# Patient Record
Sex: Female | Born: 1980 | Hispanic: Yes | Smoking: Never smoker
Health system: Southern US, Community
[De-identification: ages and names within clinical notes are randomized; demographics above are authoritative.]

## PROBLEM LIST (undated history)

## (undated) ENCOUNTER — Inpatient Hospital Stay (HOSPITAL_COMMUNITY): Payer: Self-pay

## (undated) DIAGNOSIS — Z8619 Personal history of other infectious and parasitic diseases: Secondary | ICD-10-CM

## (undated) DIAGNOSIS — K52832 Lymphocytic colitis: Secondary | ICD-10-CM

## (undated) DIAGNOSIS — R87619 Unspecified abnormal cytological findings in specimens from cervix uteri: Secondary | ICD-10-CM

## (undated) DIAGNOSIS — Z9889 Other specified postprocedural states: Secondary | ICD-10-CM

## (undated) DIAGNOSIS — IMO0002 Reserved for concepts with insufficient information to code with codable children: Secondary | ICD-10-CM

## (undated) DIAGNOSIS — A029 Salmonella infection, unspecified: Secondary | ICD-10-CM

## (undated) DIAGNOSIS — F32A Depression, unspecified: Secondary | ICD-10-CM

## (undated) DIAGNOSIS — R51 Headache: Secondary | ICD-10-CM

## (undated) DIAGNOSIS — R112 Nausea with vomiting, unspecified: Secondary | ICD-10-CM

## (undated) HISTORY — PX: UPPER GASTROINTESTINAL ENDOSCOPY: SHX188

## (undated) HISTORY — PX: COLPOSCOPY: SHX161

## (undated) HISTORY — DX: Unspecified abnormal cytological findings in specimens from cervix uteri: R87.619

## (undated) HISTORY — PX: COLONOSCOPY: SHX174

## (undated) HISTORY — DX: Salmonella infection, unspecified: A02.9

## (undated) HISTORY — DX: Headache: R51

## (undated) HISTORY — DX: Reserved for concepts with insufficient information to code with codable children: IMO0002

---
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
## 2004-09-18 DIAGNOSIS — A029 Salmonella infection, unspecified: Secondary | ICD-10-CM

## 2004-09-18 HISTORY — DX: Salmonella infection, unspecified: A02.9

## 2006-03-19 ENCOUNTER — Emergency Department (HOSPITAL_COMMUNITY): Admission: EM | Admit: 2006-03-19 | Discharge: 2006-03-19 | Payer: Self-pay | Admitting: Emergency Medicine

## 2007-01-08 ENCOUNTER — Other Ambulatory Visit: Admission: RE | Admit: 2007-01-08 | Discharge: 2007-01-08 | Payer: Self-pay | Admitting: Unknown Physician Specialty

## 2007-01-08 ENCOUNTER — Encounter (INDEPENDENT_AMBULATORY_CARE_PROVIDER_SITE_OTHER): Payer: Self-pay | Admitting: Specialist

## 2007-01-08 ENCOUNTER — Encounter (INDEPENDENT_AMBULATORY_CARE_PROVIDER_SITE_OTHER): Payer: Self-pay | Admitting: *Deleted

## 2007-07-09 ENCOUNTER — Other Ambulatory Visit: Admission: RE | Admit: 2007-07-09 | Discharge: 2007-07-09 | Payer: Self-pay | Admitting: Unknown Physician Specialty

## 2007-07-09 ENCOUNTER — Encounter (INDEPENDENT_AMBULATORY_CARE_PROVIDER_SITE_OTHER): Payer: Self-pay | Admitting: Unknown Physician Specialty

## 2008-02-25 ENCOUNTER — Other Ambulatory Visit: Admission: RE | Admit: 2008-02-25 | Discharge: 2008-02-25 | Payer: Self-pay | Admitting: Unknown Physician Specialty

## 2008-02-25 ENCOUNTER — Encounter (INDEPENDENT_AMBULATORY_CARE_PROVIDER_SITE_OTHER): Payer: Self-pay | Admitting: Unknown Physician Specialty

## 2009-12-12 ENCOUNTER — Observation Stay (HOSPITAL_COMMUNITY): Admission: EM | Admit: 2009-12-12 | Discharge: 2009-12-14 | Payer: Self-pay | Admitting: Emergency Medicine

## 2010-12-11 LAB — CBC
HCT: 37 % (ref 36.0–46.0)
Hemoglobin: 12.6 g/dL (ref 12.0–15.0)
Hemoglobin: 12.8 g/dL (ref 12.0–15.0)
MCHC: 34.6 g/dL (ref 30.0–36.0)
MCV: 86.8 fL (ref 78.0–100.0)
Platelets: 176 10*3/uL (ref 150–400)
Platelets: 180 10*3/uL (ref 150–400)
RBC: 4.21 MIL/uL (ref 3.87–5.11)
RDW: 13.6 % (ref 11.5–15.5)
RDW: 13.6 % (ref 11.5–15.5)
WBC: 9.8 10*3/uL (ref 4.0–10.5)

## 2010-12-11 LAB — COMPREHENSIVE METABOLIC PANEL
ALT: 12 U/L (ref 0–35)
AST: 12 U/L (ref 0–37)
BUN: 11 mg/dL (ref 6–23)
Calcium: 8.4 mg/dL (ref 8.4–10.5)
Chloride: 111 mEq/L (ref 96–112)
Creatinine, Ser: 0.7 mg/dL (ref 0.4–1.2)
GFR calc Af Amer: >60 mL/min (ref >60)
Sodium: 139 mEq/L (ref 135–145)
Total Bilirubin: 0.6 mg/dL (ref 0.3–1.2)

## 2010-12-11 LAB — HEPATIC FUNCTION PANEL
ALT: 13 U/L (ref 0–35)
Albumin: 3.8 g/dL (ref 3.5–5.2)
Total Protein: 7.1 g/dL (ref 6.0–8.3)

## 2010-12-11 LAB — DIFFERENTIAL
Basophils Absolute: 0 10*3/uL (ref 0.0–0.1)
Lymphocytes Relative: 26 % (ref 12–46)
Lymphs Abs: 2.1 10*3/uL (ref 0.7–4.0)
Lymphs Abs: 2.5 10*3/uL (ref 0.7–4.0)
Monocytes Relative: 7 % (ref 3–12)
Monocytes Relative: 7 % (ref 3–12)
Neutro Abs: 6.4 10*3/uL (ref 1.7–7.7)
Neutrophils Relative %: 65 % (ref 43–77)
Neutrophils Relative %: 68 % (ref 43–77)

## 2010-12-11 LAB — URINALYSIS, ROUTINE W REFLEX MICROSCOPIC
Bilirubin Urine: NEGATIVE
Hgb urine dipstick: NEGATIVE
Ketones, ur: NEGATIVE mg/dL
Protein, ur: NEGATIVE mg/dL
Specific Gravity, Urine: 1.02 (ref 1.005–1.030)
pH: 6.5 (ref 5.0–8.0)

## 2010-12-11 LAB — BASIC METABOLIC PANEL
BUN: 14 mg/dL (ref 6–23)
Chloride: 106 mEq/L (ref 96–112)
Creatinine, Ser: 1.05 mg/dL (ref 0.4–1.2)
Sodium: 138 mEq/L (ref 135–145)

## 2011-02-25 ENCOUNTER — Emergency Department (HOSPITAL_COMMUNITY)
Admission: EM | Admit: 2011-02-25 | Discharge: 2011-02-26 | Disposition: A | Discharge status: Home / Self Care | Payer: Self-pay | Attending: Emergency Medicine | Admitting: Emergency Medicine

## 2011-02-25 DIAGNOSIS — R112 Nausea with vomiting, unspecified: Secondary | ICD-10-CM | POA: Insufficient documentation

## 2011-02-25 DIAGNOSIS — R1031 Right lower quadrant pain: Secondary | ICD-10-CM | POA: Insufficient documentation

## 2011-02-26 ENCOUNTER — Emergency Department (HOSPITAL_COMMUNITY): Payer: Self-pay

## 2011-02-26 LAB — COMPREHENSIVE METABOLIC PANEL
AST: 17 U/L (ref 0–37)
Albumin: 3.1 g/dL — ABNORMAL LOW (ref 3.5–5.2)
Chloride: 107 mEq/L (ref 96–112)
Creatinine, Ser: 0.52 mg/dL (ref 0.4–1.2)
Potassium: 3.4 mEq/L — ABNORMAL LOW (ref 3.5–5.1)
Sodium: 140 mEq/L (ref 135–145)
Total Bilirubin: 0.1 mg/dL — ABNORMAL LOW (ref 0.3–1.2)

## 2011-02-26 LAB — CBC
HCT: 35.3 % — ABNORMAL LOW (ref 36.0–46.0)
Hemoglobin: 11.6 g/dL — ABNORMAL LOW (ref 12.0–15.0)
MCH: 28.5 pg (ref 26.0–34.0)
MCHC: 32.9 g/dL (ref 30.0–36.0)
MCV: 86.7 fL (ref 78.0–100.0)
Platelets: 179 K/uL (ref 150–400)
RBC: 4.07 MIL/uL (ref 3.87–5.11)
RDW: 13.6 % (ref 11.5–15.5)
WBC: 10.1 10*3/uL (ref 4.0–10.5)

## 2011-02-26 LAB — POCT PREGNANCY, URINE: Preg Test, Ur: NEGATIVE

## 2011-02-26 LAB — DIFFERENTIAL
Basophils Absolute: 0 K/uL (ref 0.0–0.1)
Basophils Relative: 0 % (ref 0–1)
Eosinophils Absolute: 0.2 K/uL (ref 0.0–0.7)
Eosinophils Relative: 2 % (ref 0–5)
Lymphocytes Relative: 18 % (ref 12–46)
Lymphs Abs: 1.9 10*3/uL (ref 0.7–4.0)
Monocytes Absolute: 1 K/uL (ref 0.1–1.0)
Monocytes Relative: 10 % (ref 3–12)
Neutro Abs: 7.1 K/uL (ref 1.7–7.7)
Neutrophils Relative %: 70 % (ref 43–77)

## 2011-02-26 LAB — COMPREHENSIVE METABOLIC PANEL WITH GFR
ALT: 19 U/L (ref 0–35)
Alkaline Phosphatase: 84 U/L (ref 39–117)
BUN: 18 mg/dL (ref 6–23)
CO2: 24 meq/L (ref 19–32)
Calcium: 8.1 mg/dL — ABNORMAL LOW (ref 8.4–10.5)
GFR calc Af Amer: >60 mL/min (ref >60)
GFR calc non Af Amer: >60 mL/min (ref >60)
Glucose, Bld: 108 mg/dL — ABNORMAL HIGH (ref 70–99)
Total Protein: 6.2 g/dL (ref 6.0–8.3)

## 2011-02-26 LAB — APTT: aPTT: 28 seconds (ref 24–37)

## 2011-02-26 LAB — URINALYSIS, ROUTINE W REFLEX MICROSCOPIC
Bilirubin Urine: NEGATIVE
Glucose, UA: NEGATIVE mg/dL
Hgb urine dipstick: NEGATIVE
Nitrite: NEGATIVE
Specific Gravity, Urine: 1.017 (ref 1.005–1.030)
pH: 7.5 (ref 5.0–8.0)

## 2011-02-26 LAB — WET PREP, GENITAL
Clue Cells Wet Prep HPF POC: NONE SEEN
Trich, Wet Prep: NONE SEEN
WBC, Wet Prep HPF POC: NONE SEEN

## 2011-02-26 LAB — LIPASE, BLOOD: Lipase: 35 U/L (ref 11–59)

## 2011-02-26 LAB — PROTIME-INR
INR: 1 (ref 0.00–1.49)
Prothrombin Time: 13.4 s (ref 11.6–15.2)

## 2011-02-26 MED ORDER — IOHEXOL 300 MG/ML  SOLN
100.0000 mL | Freq: Once | INTRAMUSCULAR | Status: AC | PRN
  Administered 2011-02-26: 100 mL via INTRAVENOUS

## 2011-02-28 LAB — GC/CHLAMYDIA PROBE AMP, GENITAL
Chlamydia, DNA Probe: NEGATIVE
GC Probe Amp, Genital: NEGATIVE

## 2011-11-15 ENCOUNTER — Emergency Department (HOSPITAL_COMMUNITY)
Admission: EM | Admit: 2011-11-15 | Discharge: 2011-11-15 | Disposition: A | Discharge status: Home / Self Care | Payer: Self-pay | Attending: Emergency Medicine | Admitting: Emergency Medicine

## 2011-11-15 ENCOUNTER — Encounter (HOSPITAL_COMMUNITY): Payer: Self-pay | Admitting: *Deleted

## 2011-11-15 DIAGNOSIS — J329 Chronic sinusitis, unspecified: Secondary | ICD-10-CM | POA: Insufficient documentation

## 2011-11-15 DIAGNOSIS — R51 Headache: Secondary | ICD-10-CM | POA: Insufficient documentation

## 2011-11-15 LAB — DIFFERENTIAL
Basophils Absolute: 0 10*3/uL (ref 0.0–0.1)
Basophils Relative: 0 % (ref 0–1)
Eosinophils Absolute: 0.1 10*3/uL (ref 0.0–0.7)
Lymphs Abs: 2.3 10*3/uL (ref 0.7–4.0)
Neutrophils Relative %: 67 % (ref 43–77)

## 2011-11-15 LAB — CBC
MCH: 28.8 pg (ref 26.0–34.0)
MCHC: 33.2 g/dL (ref 30.0–36.0)
Platelets: 200 10*3/uL (ref 150–400)
RBC: 4.52 MIL/uL (ref 3.87–5.11)

## 2011-11-15 LAB — BASIC METABOLIC PANEL
GFR calc Af Amer: >90 mL/min (ref >90)
GFR calc non Af Amer: >90 mL/min (ref >90)
Potassium: 4 mEq/L (ref 3.5–5.1)
Sodium: 141 mEq/L (ref 135–145)

## 2011-11-15 MED ORDER — PROMETHAZINE HCL 12.5 MG PO TABS
25.0000 mg | ORAL_TABLET | Freq: Once | ORAL | Status: AC
  Administered 2011-11-15: 25 mg via ORAL
  Filled 2011-11-15: qty 2

## 2011-11-15 MED ORDER — IBUPROFEN 800 MG PO TABS
800.0000 mg | ORAL_TABLET | Freq: Once | ORAL | Status: AC
  Administered 2011-11-15: 800 mg via ORAL
  Filled 2011-11-15: qty 1

## 2011-11-15 MED ORDER — AMOXICILLIN 500 MG PO CAPS: 500.0000 mg | ORAL_CAPSULE | Freq: Three times a day (TID) | ORAL | Status: AC

## 2011-11-15 MED ORDER — IBUPROFEN 800 MG PO TABS: 800.0000 mg | ORAL_TABLET | Freq: Three times a day (TID) | ORAL | Status: AC

## 2011-11-15 MED ORDER — FAMOTIDINE 20 MG PO TABS: 20.0000 mg | ORAL_TABLET | Freq: Two times a day (BID) | ORAL | Status: DC

## 2011-11-15 NOTE — ED Notes (Signed)
Waiting for lab to draw blood

## 2011-11-15 NOTE — Discharge Instructions (Signed)
Sinusitis (Sinusitis) Los senos paranasales son bolsas de aire que se encuentran dentro de los huesos de la cara. La aparicin de bacterias en los senos paranasales lleva a una infeccin. Esta se denomina sinusitis.Estas infecciones generalmente son el resultado de una obstruccin en los orificios que drenan los senos.   SNTOMAS Generalmente, segn que seno se infecte, habr diferentes reas en las que puede aparecer dolor.    Los senos maxilares generalmente producen dolor detrs de los ojos.     La sinusitis frontal ocasiona dolor en el medio de la frente y sobre los ojos.  Entre otros problemas (sntomas) se incluyen:  Dolor en la zona posterior de los dientes superiores.     Una secrecin similar al pus (purulenta) proveniente de la nariz.     Toda inflamacin, calor o sensibilidad en estas mismas reas son indicios de infeccin.  TRATAMIENTO La sinusitis se diagnostica a travs del examen fsico y radiografas. Si le han tomado radiografas, asegrese de retirar los resultados. O consulte el modo en que podr obtenerlos. Su mdico le prescribir medicamentos (antibiticos). Estos medicamentos se indican para combatir la infeccin. Tambin le prescribir un descongestivo para reducir la inflamacin del seno paranasal.   INSTRUCCIONES PARA EL CUIDADO DOMICILIARIO  Utilice los medicamentos de venta libre o de prescripcin para el dolor, el malestar o la fiebre, segn se lo indique el profesional que lo asiste.     Beba gran cantidad de lquidos. Los lquidos ayudan a que las mucosas de los senos nasales drenen ms fcilmente.     Aplique bolsas de calor hmedo o hielo en las zonas ms doloridas para aliviar las molestias.     Utilice Sprays nasales salinos para ayudar a humedecer los senos nasales. Estos pueden encontrarse en la farmacia local.  SOLICITE ATENCIN MDICA DE INMEDIATO SI:  Tiene fiebre.     Dolor en aumento, dolor de cabeza intenso o dolor de dientes.     Nuseas,  vmitos o sudoracin.     Hinchazn infrecuente alrededor del rostro o problemas en la visin.  EST SEGURO QUE:    Comprende las instrucciones para el alta mdica.     Controlar su enfermedad.     Solicitar atencin mdica de inmediato segn las indicaciones.  Document Released: 06/14/2005 Document Revised: 05/17/2011 ExitCare Patient Information 2012 [employer]. 

## 2011-11-15 NOTE — ED Notes (Signed)
Pt c/o pain in her face and right eye since 1 pm.

## 2011-11-15 NOTE — ED Notes (Signed)
Interpreter line contacted

## 2011-11-15 NOTE — ED Provider Notes (Signed)
History     CSN: [id number]  Arrival date & time 11/15/11  1713   First MD Initiated Contact with Patient 11/15/11 1748      Chief Complaint  Patient presents with  . Facial Pain    (Consider location/radiation/quality/duration/timing/severity/associated sxs/prior treatment) HPI Comments: Patient c/o sudden onset of pain to her right face through the use of [place] language interpreter.  Describes the pain as aching from her teeth up to her right ear and eye.  She denies fever, headache, neck pain or visual changes.  Patient is a 31 y.o. female presenting with sinusitis. The history is provided by the patient. The history is limited by a language barrier. A language interpreter was used.  Sinusitis  This is a new problem. The current episode started 3 to 5 hours ago. The problem has not changed since onset.There has been no fever. The pain is mild. The pain has been constant since onset. Associated symptoms include congestion and sinus pressure. Pertinent negatives include no chills, no sweats, no ear pain, no hoarse voice, no sore throat, no swollen glands, no cough and no shortness of breath. She has tried nothing for the symptoms. The treatment provided no relief.    History reviewed. No pertinent past medical history.  Past Surgical History  Procedure Date  . Cesarean section     History reviewed. No pertinent family history.  History  Substance Use Topics  . Smoking status: Never Smoker   . Smokeless tobacco: Not on file  . Alcohol Use: No    OB History    Grav Para Term Preterm Abortions TAB SAB Ect Mult Living                  Review of Systems  Constitutional: Negative for fever and chills.  HENT: Positive for congestion and sinus pressure. Negative for hearing loss, ear pain, sore throat, hoarse voice, facial swelling, trouble swallowing, neck pain and neck stiffness.   Respiratory: Negative for cough and shortness of breath.   Skin: Negative.   Neurological:  Negative for dizziness, numbness and headaches.  Hematological: Negative for adenopathy.  All other systems reviewed and are negative.    Allergies  Review of patient's allergies indicates no known allergies.  Home Medications  No current outpatient prescriptions on file.  BP 127/74  Pulse 60  Temp(Src) 98.5 F (36.9 C) (Oral)  Resp 17  Ht 5\' 6"  (1.676 m)  Wt 150 lb (68.04 kg)  BMI 24.21 kg/m2  SpO2 100%  LMP 11/04/2011  Physical Exam  Nursing note and vitals reviewed. Constitutional: She is oriented to person, place, and time. She appears well-developed and well-nourished. No distress.  HENT:  Head: Normocephalic and atraumatic.  Right Ear: Tympanic membrane and ear canal normal.  Left Ear: Tympanic membrane and ear canal normal.  Mouth/Throat: Uvula is midline, oropharynx is clear and moist and mucous membranes are normal.  Eyes: Conjunctivae, EOM and lids are normal. Pupils are equal, round, and reactive to light.  Neck: Trachea normal and normal range of motion. Neck supple. No spinous process tenderness and no muscular tenderness present. No Brudzinski's sign and no Kernig's sign noted.  Cardiovascular: Normal rate, regular rhythm, normal heart sounds and intact distal pulses.   No murmur heard. Pulmonary/Chest: Effort normal and breath sounds normal. No respiratory distress.  Musculoskeletal: Normal range of motion. She exhibits no edema and no tenderness.  Lymphadenopathy:    She has no cervical adenopathy.  Neurological: She is alert and oriented to  person, place, and time. She exhibits normal muscle tone. Coordination normal.  Skin: Skin is warm and dry.    ED Course  Procedures (including critical care time)  Results for orders placed during the hospital encounter of 11/15/11  CBC      Component Value Range   WBC 9.4  4.0 - 10.5 (K/uL)   RBC 4.52  3.87 - 5.11 (MIL/uL)   Hemoglobin 13.0  12.0 - 15.0 (g/dL)   HCT 14.7  82.9 - 56.2 (%)   MCV 86.7  78.0 -  100.0 (fL)   MCH 28.8  26.0 - 34.0 (pg)   MCHC 33.2  30.0 - 36.0 (g/dL)   RDW 13.0  86.5 - 78.4 (%)   Platelets 200  150 - 400 (K/uL)  DIFFERENTIAL      Component Value Range   Neutrophils Relative 67  43 - 77 (%)   Neutro Abs 6.4  1.7 - 7.7 (K/uL)   Lymphocytes Relative 25  12 - 46 (%)   Lymphs Abs 2.3  0.7 - 4.0 (K/uL)   Monocytes Relative 6  3 - 12 (%)   Monocytes Absolute 0.5  0.1 - 1.0 (K/uL)   Eosinophils Relative 2  0 - 5 (%)   Eosinophils Absolute 0.1  0.0 - 0.7 (K/uL)   Basophils Relative 0  0 - 1 (%)   Basophils Absolute 0.0  0.0 - 0.1 (K/uL)  BASIC METABOLIC PANEL      Component Value Range   Sodium 141  135 - 145 (mEq/L)   Potassium 4.0  3.5 - 5.1 (mEq/L)   Chloride 106  96 - 112 (mEq/L)   CO2 27  19 - 32 (mEq/L)   Glucose, Bld 99  70 - 99 (mg/dL)   BUN 13  6 - 23 (mg/dL)   Creatinine, Ser 6.96  0.50 - 1.10 (mg/dL)   Calcium 9.7  8.4 - 29.5 (mg/dL)   GFR calc non Af Amer >90  >90 (mL/min)   GFR calc Af Amer >90  >90 (mL/min)  RAPID STREP SCREEN      Component Value Range   Streptococcus, Group A Screen (Direct) NEGATIVE  NEGATIVE          MDM    Previous ED chart and nursing notes were reviewed by me.  Patient is feeling better. States her facial pain has improved after medication.  Vital signs are stable patient is nontoxic appearing.  She has drank fluids without difficulty.  She complained of some GI upset upon reevaluation.  Abdominal exam is benign. Abdomen is soft and nontender, no peritoneal signs.  Family member at the bedside states that she has a history of chronic abdominal pain.  She was seen at [facility] in August of last year and had a full workup for abdominal pain without specific cause.    Labs today are within normal limits.  Facial pain is likely related to dental pain versus sinusitis. I will prescribe pain medication and antibiotics.  She requests a referral for a primary care physician. She also agrees to return here if symptoms  worsen.Patient / Family / Caregiver understand and agree with initial ED impression and plan with expectations set for ED visit. Pt stable in ED with no significant deterioration in condition.   Emmali Karow L. [place], [place] 11/17/11 2109

## 2011-11-17 NOTE — ED Provider Notes (Signed)
Medical screening examination/treatment/procedure(s) were performed by non-physician practitioner and as supervising physician I was immediately available for consultation/collaboration.  Arely Tinner R. Dori Devino, MD 11/17/11 2336 

## 2013-04-17 ENCOUNTER — Other Ambulatory Visit: Payer: Self-pay | Admitting: Obstetrics & Gynecology

## 2013-04-17 DIAGNOSIS — O3680X0 Pregnancy with inconclusive fetal viability, not applicable or unspecified: Secondary | ICD-10-CM

## 2013-04-22 ENCOUNTER — Ambulatory Visit (INDEPENDENT_AMBULATORY_CARE_PROVIDER_SITE_OTHER): Payer: Self-pay

## 2013-04-22 DIAGNOSIS — O34219 Maternal care for unspecified type scar from previous cesarean delivery: Secondary | ICD-10-CM

## 2013-04-22 DIAGNOSIS — O26849 Uterine size-date discrepancy, unspecified trimester: Secondary | ICD-10-CM

## 2013-04-22 DIAGNOSIS — O3680X0 Pregnancy with inconclusive fetal viability, not applicable or unspecified: Secondary | ICD-10-CM

## 2013-04-22 NOTE — Progress Notes (Signed)
U/S-single IUP with+FCA noted, CRL c/w 7+2wks EDD 12/07/2013, cx long and closed, bilateral adnexa wnl

## 2013-05-05 ENCOUNTER — Ambulatory Visit (INDEPENDENT_AMBULATORY_CARE_PROVIDER_SITE_OTHER): Payer: Self-pay | Admitting: Women's Health

## 2013-05-05 ENCOUNTER — Encounter: Payer: Self-pay | Admitting: Women's Health

## 2013-05-05 VITALS — BP 116/72 | Temp 98.5°F | Wt 129.5 lb

## 2013-05-05 DIAGNOSIS — O34219 Maternal care for unspecified type scar from previous cesarean delivery: Secondary | ICD-10-CM

## 2013-05-05 DIAGNOSIS — O219 Vomiting of pregnancy, unspecified: Secondary | ICD-10-CM | POA: Insufficient documentation

## 2013-05-05 DIAGNOSIS — Z3481 Encounter for supervision of other normal pregnancy, first trimester: Secondary | ICD-10-CM

## 2013-05-05 DIAGNOSIS — Z331 Pregnant state, incidental: Secondary | ICD-10-CM

## 2013-05-05 DIAGNOSIS — Z3480 Encounter for supervision of other normal pregnancy, unspecified trimester: Secondary | ICD-10-CM | POA: Insufficient documentation

## 2013-05-05 DIAGNOSIS — Z98891 History of uterine scar from previous surgery: Secondary | ICD-10-CM

## 2013-05-05 DIAGNOSIS — Z1389 Encounter for screening for other disorder: Secondary | ICD-10-CM

## 2013-05-05 LAB — CBC
MCH: 29 pg (ref 26.0–34.0)
MCHC: 33.9 g/dL (ref 30.0–36.0)
MCV: 85.7 fL (ref 78.0–100.0)

## 2013-05-05 LAB — POCT URINALYSIS DIPSTICK
Ketones, UA: NEGATIVE
Leukocytes, UA: NEGATIVE

## 2013-05-05 MED ORDER — PROMETHAZINE HCL 25 MG PO TABS: 25.0000 mg | ORAL_TABLET | Freq: Four times a day (QID) | ORAL | Status: DC | PRN

## 2013-05-05 NOTE — Progress Notes (Signed)
New OB packet given. Consents signed.  

## 2013-05-05 NOTE — Patient Instructions (Signed)
Embarazo  Primer trimestre  (Pregnancy - First Trimester)  Durante el acto sexual, millones de espermatozoides entran en la vagina. Slo 1 espermatozoide penetra y fertiliza al vulo mientras se encuentra en la trompa de [place]. Una semana ms tarde, el vulo fertilizado se implanta en la pared del tero. Un embrin comienza a desarrollarse para ser un beb. A las 6 a 8 semanas se forman los ojos y la cara y los latidos del corazn se pueden ver en la ecografa. Al final de las 12 semanas (primer trimestre) todos los rganos del beb estn formados. Ahora que est embarazada, querr hacer todo lo que est a su alcance para tener un beb sano. Dos de las cosas ms importantes son: tener una buena atencin prenatal y seguir las indicaciones del profesional que la asiste. La atencin prenatal incluye toda la asistencia mdica que usted recibe antes del nacimiento del beb. Se lleva a cabo para prevenir y tratar problemas durante el embarazo y el parto. EXAMENES PRENATALES   Durante las visitas prenatales se controlan el peso, la presin arterial y se solicitan anlisis de orina. Esto se hace para asegurarse de que usted est sana y el embarazo progrese normalmente.  Una mujer embarazada debe aumentar de 25 a 35 libras durante el embarazo. Sin embargo, si usted tiene sobrepeso o bajo peso, su mdico le aconsejar qu hacer.  El podr hacerle preguntas y responder todas las que usted le haga.  Durante los exmenes prenatales se solicitan anlisis de sangre, cultivos del tero, un [place] y otros anlisis necesarios. Estas pruebas se realizan para controlar su salud y la del beb. Se recomienda que se haga la prueba para el diagnstico del VIH, con su autorizacin. Este es el virus que causa el SIDA. Estas pruebas se realizan porque existen medicamentos que podran administrarle para prevenir que el beb nazca con esta infeccin, si usted estuviera infectada y no lo supiera. Los anlisis de sangre  tambin se realizan para determinar el tipo de sangre, si tuvo infecciones previas y controlar sus niveles en la sangre (hemoglobina).  Tener un recuento bajo de hemoglobina (anemia) es comn durante el embarazo. Para prevenirla, se administran hierro y vitaminas. En una etapa ms avanzada del embarazo, le indicarn exmenes de sangre para saber si tiene diabetes, junto con otros anlisis, en caso de que tuviera problemas.  Es necesario que se haga las pruebas para asegurarse de que usted y el beb estn bien. CAMBIOS DURANTE EL PRIMER TRIMESTRE  Su organismo atravesar numerosos cambios durante el embarazo. Estos pueden variar de una persona a otra. Converse con el mdico acerca los cambios que usted nota y que la preocupan. Ellos son:   El perodo menstrual se detiene.  El vulo y los espermatozoides llevan los genes que determinan cmo seremos. Sus genes y los de su pareja forman el beb. Los genes del varn determinan si ser un nio o una nia.  La circunferencia de la cintura va a ir aumentando y podr sentirse hinchada.  Puede tener malestar estomacal (nuseas) y vmitos. Si no puede controlar los vmitos, consulte a su mdico.  Sus mamas comenzarn a agrandarse y sensibilizarse.  Los pezones pueden sobresalir ms y ser ms oscuros.  Tendr necesidad de orinar ms. El dolor al orinar puede significar que usted tiene una infeccin de la vejiga.  Se cansar con facilidad.  Prdida del apetito.  Sentir un fuerte deseo de consumir ciertos alimentos.  Al principio, usted puede ganar o perder un par de kilos.    Podr tener cambios emocionales de un da a otro (entusiasmo por estar embarazada o preocupacin por el embarazo y el beb).  Tendr sueos ms vvidos y extraos. INSTRUCCIONES PARA EL CUIDADO EN EL HOGAR   Es muy importante evitar el cigarrillo, el alcohol y los frmacos no recetados durante el embarazo. Estas sustancias afectan la formacin y el desarrollo del beb.  Evite los productos qumicos durante el embarazo para asegurar la salud del beb.  Comience las consultas prenatales alrededor de la 12 semana de embarazo. Generalmente se programan cada mes al principio y se hacen ms frecuentes en los 2 ltimos meses antes del parto. Cumpla con las citas de control. Siga las indicaciones del mdico con respecto al uso de medicamentos, los anlisis y pruebas de laboratorio, los ejercicios y la dieta.  Durante el embarazo debe obtener nutrientes para usted y para su beb. Consuma alimentos balanceados. Elija alimentos como carne, pescado, leche y otros productos lcteos descremados, vegetales, frutas, panes integrales y cereales. El mdico le informar cul es el aumento de peso ideal.  Las nuseas matinales pueden aliviarse si come algunas galletitas saladas en la cama. Coma dos galletitas antes de levantarse por la maana. Tambin puede comer galletitas sin sal.  Hacer 4 o 5 comidas pequeas en lugar de 3 comidas grandes por da tambin puede aliviar las nuseas y los vmitos.  Beber lquidos entre las comidas en lugar de tomarlos durante las comidas tambin puede ayudar a calmar las nuseas y los vmitos.  Puede continuar teniendo relaciones sexuales durante todo el embarazo si no hay otros problemas. Los problemas pueden ser una prdida precoz (prematura) de lquido amnitico, sangrado vaginal, o dolor en el vientre (abdominal).  Realice actividad fsica todos los das, si no tiene restricciones. Consulte con su mdico o terapeuta fsico si no est segura de algunos de sus ejercicios. El mayor aumento de peso se producir en los ltimos 2 trimestres del embarazo. El ejercicio le ayudar a:  Controlar su peso.  Mantenerse en forma.  Prepararse para el parto.  La ayudar a perder el peso del embarazo despus de que nazca su beb.  Use un buen sostn o como los que se usan para hacer deportes para aliviar la sensibilidad de las mamas. Tambin puede serle  til si lo usa mientras duerme.  Consulte cuando puede comenzar con las clases de pre parto. Comience con las clases cuando estn disponibles.  No utilice la baera con agua caliente, baos turcos y saunas.   Colquese el cinturn de seguridad cuando conduzca. Este la proteger a usted y al beb en caso de accidente.  Evite comer carne cruda y el contacto con los utensilios y desperdicios de los gatos. Estos elementos contienen grmenes que pueden causar defectos de nacimiento en el beb.  El primer trimestre es un buen momento para visitar a su dentista y evaluar su salud dental. Es importante mantener los dientes limpios. Use un cepillo de dientes suave y cepllese con ms suavidad durante el embarazo.  Pida ayuda si tienen necesidades financieras, teraputicas o nutricionales. El profesional podr ayudarla con respecto a estas necesidades, o derivarla a otros especialistas.  No tome medicamentos o hierbas excepto aquellos que le indic el profesional.  Informe a su mdico si sufre violencia familiar mental o fsica.  Haga una lista de nmeros de telfono de emergencia de la familia, los amigos, el hospital y los departamentos de polica y bomberos.  Escriba sus preguntas. Llvelas cuando concurra a su visita prenatal.  No se   haga duchas vaginales.  No cruce las piernas.  Si usted tiene que estar parada por largos perodos de [place], gire los pies o de pequeos pasos en crculo.  Es posible que tenga ms secreciones vaginales que puedan requerir una toalla higinica. No use tampones o toallas higinicas perfumadas. EL CONSUMO DE MEDICAMENTOS Y FRMACOS DURANTE EL EMBARAZO   Tome las vitaminas para la etapa prenatal tal como se le indic. Las vitaminas deben contener un miligramo de cido flico. Guarde todas las vitaminas fuera del alcance de los nios. La ingestin de slo un par de vitaminas o tabletas que contengan hierro pueden ocasionar la [employer] en un beb o en un nio pequeo.  Evite  el uso de [employer], incluyendo hierbas, medicamentos de [place], sin receta o que no hayan sido sugeridos por su mdico. Slo tome medicamentos de venta libre o medicamentos recetados para Chief Technology Officer, Environmental health practitioner o fiebre como lo indique su mdico. No tome aspirina, ibuprofeno o naproxeno excepto que su mdico se lo indique.  Infrmele al profesional si consume medicamentos de hierbas.  El alcohol se relaciona con ciertos defectos congnitos. Incluye el sndrome de alcoholismo fetal. Debe evitar absolutamente el consumo de alcohol, en cualquier forma. El fumar causar baja tasa de natalidad y bebs prematuros.  Las drogas ilegales o de la calle son muy perjudiciales para el beb. Estn absolutamente prohibidas. Un beb que nace de [employer], ser adicto al nacer. Ese beb tendr los mismos sntomas de abstinencia que un adulto.  Informe a su mdico acerca de los medicamentos que ha tomado y el motivo por el que los tom. SOLICITE ATENCIN MDICA SI:  Tiene preguntas o preocupaciones relacionadas con el embarazo. Es mejor que llame para formular las preguntas si no puede esperar hasta la prxima visita, que sentirse preocupada por ellas.  SOLICITE ATENCIN MDICA DE INMEDIATO SI:   La temperatura oral le sube a ms de 102 F (38.9 C) o lo que su mdico le indique.  Tiene una prdida de lquido por la vagina (canal de parto). Si sospecha una ruptura de las [place], tmese la temperatura y llame al profesional para informarlo sobre esto.  Observa unas pequeas manchas o una hemorragia vaginal. Notifique al profesional acerca de la cantidad y de cuntos apsitos est utilizando.  Presenta un olor desagradable en la secrecin vaginal y observa un cambio en el color.  Contina con las nuseas y no obtiene alivio de los remedios indicados. Vomita sangre o algo similar a la borra del caf.  Pierde ms de 2 libras (1 Kg) en una semana.  Aumenta ms de 1 Kg en una semana y  [address], las manos, los pies o las piernas hinchados.  Aumenta ms de 2,5 Kg en una semana (aunque no tenga las manos, pies, piernas o el rostro hinchados).  Ha estado expuesta a la rubola y no ha sufrido la enfermedad.  Ha estado expuesta a la quinta enfermedad o a la varicela.  Siente dolor en el vientre (abdominal). Las [employer] en el ligamento redondo son Neomia Dear causa benigna frecuente de dolor abdominal durante el embarazo. El profesional que la asiste deber evaluarla.  Presenta dolor de Renne Musca, diarrea, dolor al orinar o le falta la respiracin.  Se cae, se ve involucrada en un accidente automovilstico o sufre algn tipo de traumatismo.  En su hogar hay violencia mental o fsica. Document Released: 06/14/2005 Document Revised: 05/29/2012 [facility] Patient Information 2014 [place], [place].  Nuseas matinales (Morning Sickness)  Se denominan nuseas matinales a las ganas de vomitar (nuseas) durante el Psychiatrist. Esta sensacin puede estar acompaada o no de vmitos. Aparecen por la maana, pero puede ser un problema a lo largo de [employer]. Aunque son molestas, generalmente no causan ningn dao, excepto que presente vmitos continuos e intensos (hiperemesis gravdica). Este problema requiere un tratamiento ms intenso. CAUSAS La causa no se conoce, pero parece estar relacionado con un incremento sbito de dos hormonas:   La gonadotrofina corinica humana.  Los estrgenos. Ambas hormonas elevan su nivel en la primera etapa del embarazo. TRATAMIENTO No utilice ningn medicamento (prescripto, de venta libre ni hierbas) para este problema sin consultar con su mdico. Algunas pacientes obtienen ayuda haciendo lo siguiente:  Vitamin B6, 25mg  cada 8 horas o Vitamin B6 inyectable.  Un antihistamnico, doxylamina, 10 mg. cada 8 horas.  El jengibre, planta medicinal, parece ser de utilidad en [employer]. INSTRUCCIONES PARA EL CUIDADO DOMICILIARIO  Tomar un  multivitamnico antes de quedar embarazada puede prevenir o disminuir la gravedad de las nuseas matinales en la mayoria de las [place].  Coma un trozo de [place] seca o cracker sin sal antes de levantarse de la cama por la maana.  Coma 5  6 comidas pequeas por da.  Consuma alimentos blandos y secos (arroz, papas asadas).  No beba lquidos con las comidas, hgalo entre comidas.  Evite los alimentos muy grasos o condimentados.  Pdale a otra persona que cocine para usted si [facility] de algn alimento le provoca nuseas o vmitos.  Evite los comprimidos vitamnicos con hierro, debido a que provoca nuseas.  Tome colaciones de alimentos proteicos entre comidas si siente apetito.  Coma gelatina sin azcar de postre.  Una pulsera de acupresin ( que se utiliza para Research scientist (life sciences) en viajes) puede ser de [place].  La acupuntura puede ayudarla.  No fume.  Consiga un humidificador para Customer service manager de su casa libre de [place]. SOLICITE ATENCIN MDICA SI:  Los remedios caseros no funcionan y Media planner.  Se siente mareada o sufre un desmayo.  Pierde peso.  Necesita ayuda con su dieta. SOLICITE ATENCIN MDICA DE INMEDIATO SI:  Tiene nuseas y vmitos de manera persistente y no puede controlarlos.  Se desmaya.  Tiene fiebre. ASEGURESE QUE:   Comprende estas instrucciones.  Controlar su enfermedad.  Solicitar ayuda de inmediato si no mejora o si empeora. Document Released: 12/21/2008 Document Revised: 11/27/2011 [facility] Patient Information 2014 [place], [place].

## 2013-05-05 NOTE — Progress Notes (Addendum)
  Subjective:    Alison Mendoza is a 32 y.o. G97P1001 Hispanic female at [redacted]w[redacted]d by LMP which correlates well w/ last week's u/s, being seen today for her first obstetrical visit.  Her obstetrical history is significant for previous c/s in [place] 2003. Marland Kitchen  Pregnancy history fully reviewed.   Patient reports nausea and vomiting. Denies cramping, vb, uti s/s.   Filed Vitals:   05/05/13 1639  BP: 116/72  Temp: 98.5 F (36.9 C)  Weight: 129 lb 8 oz (58.741 kg)    HISTORY: OB History  Gravida Para Term Preterm AB SAB TAB Ectopic Multiple Living  2 1 1       1     # Outcome Date GA Lbr Len/2nd Weight Sex Delivery Anes PTL Lv  2 CUR           1 TRM 10/30/01 [redacted]w[redacted]d  7 lb (3.175 kg) F CS Spinal  Y     Comments: has vertical skin incision, unknown uterine incision     History reviewed. No pertinent past medical history. Past Surgical History  Procedure Laterality Date  . Cesarean section     History reviewed. No pertinent family history.   Exam   System:     Skin: normal coloration and turgor, no rashes    Neurologic: oriented, normal mood   Extremities: normal strength, tone, and muscle mass   HEENT PERRLA   Mouth/Teeth mucous membranes moist   Cardiovascular: regular rate and rhythm   Respiratory:  appears well, vitals normal, no respiratory distress, acyanotic, normal RR   Abdomen: soft, non-tender    Thin prep pap smear not obtained, pt states she had a normal pap in Feb at [facility] FHR 140s via informal transabdominal u/s   Assessment:    Pregnancy: G2P1001 Patient Active Problem List   Diagnosis Date Noted  . Supervision of other normal pregnancy 05/05/2013    Priority: High  . Previous cesarean section 05/05/2013    Priority: High      [redacted]w[redacted]d G2P1001 New OB visit Prev c/s- unknown uterine incision N/V pregnancy    Plan:     Initial labs drawn Continue prenatal vitamins Problem list reviewed and updated Reviewed n/v relief measures and warning s/s to  report Reviewed recommended weight gain based on pre-gravid BMI Encouraged well-balanced diet Genetic Screening discussed Integrated Screen: declined Cystic fibrosis screening discussed declined Ultrasound discussed; fetal survey: requested Follow up in 4 weeks Rx Phenergan Work note given per request Get pap results from [facility]  Marge Duncans 05/05/2013 5:28 PM

## 2013-05-06 LAB — URINE CULTURE
Colony Count: NO GROWTH
Organism ID, Bacteria: NO GROWTH

## 2013-05-06 LAB — ABO AND RH: Rh Type: POSITIVE

## 2013-05-06 LAB — SICKLE CELL SCREEN: Sickle Cell Screen: NEGATIVE

## 2013-05-06 LAB — ANTIBODY SCREEN: Antibody Screen: NEGATIVE

## 2013-05-06 LAB — VARICELLA ZOSTER ANTIBODY, IGG: Varicella IgG: 496.3 Index — ABNORMAL HIGH (ref <135.00)

## 2013-05-07 LAB — CYSTIC FIBROSIS DIAGNOSTIC STUDY

## 2013-05-09 ENCOUNTER — Encounter: Payer: Self-pay | Admitting: Women's Health

## 2013-05-22 ENCOUNTER — Encounter: Payer: Self-pay | Admitting: *Deleted

## 2013-05-22 DIAGNOSIS — O219 Vomiting of pregnancy, unspecified: Secondary | ICD-10-CM

## 2013-05-22 DIAGNOSIS — Z98891 History of uterine scar from previous surgery: Secondary | ICD-10-CM

## 2013-05-22 DIAGNOSIS — Z3482 Encounter for supervision of other normal pregnancy, second trimester: Secondary | ICD-10-CM

## 2013-05-22 NOTE — Progress Notes (Signed)
Pt had normal pap on 11/15/12

## 2013-06-02 ENCOUNTER — Encounter: Payer: Self-pay | Admitting: Obstetrics & Gynecology

## 2013-06-02 ENCOUNTER — Ambulatory Visit (INDEPENDENT_AMBULATORY_CARE_PROVIDER_SITE_OTHER): Payer: Self-pay | Admitting: Obstetrics & Gynecology

## 2013-06-02 VITALS — BP 100/60 | Temp 98.3°F | Wt 126.0 lb

## 2013-06-02 DIAGNOSIS — O210 Mild hyperemesis gravidarum: Secondary | ICD-10-CM

## 2013-06-02 DIAGNOSIS — Z1389 Encounter for screening for other disorder: Secondary | ICD-10-CM

## 2013-06-02 DIAGNOSIS — Z331 Pregnant state, incidental: Secondary | ICD-10-CM

## 2013-06-02 DIAGNOSIS — O219 Vomiting of pregnancy, unspecified: Secondary | ICD-10-CM

## 2013-06-02 DIAGNOSIS — Z3481 Encounter for supervision of other normal pregnancy, first trimester: Secondary | ICD-10-CM

## 2013-06-02 LAB — POCT URINALYSIS DIPSTICK
Blood, UA: NEGATIVE
Glucose, UA: NEGATIVE
Ketones, UA: NEGATIVE
Leukocytes, UA: NEGATIVE
Protein, UA: NEGATIVE

## 2013-06-02 MED ORDER — GLYCOPYRROLATE 2 MG PO TABS: 2.0000 mg | ORAL_TABLET | Freq: Three times a day (TID) | ORAL | Status: DC

## 2013-06-02 MED ORDER — PROMETHAZINE HCL 25 MG PO TABS: 25.0000 mg | ORAL_TABLET | Freq: Four times a day (QID) | ORAL | Status: DC | PRN

## 2013-06-02 NOTE — Progress Notes (Signed)
PTstates that she is not feeling good. Not able to work because of the way she feels. C/C Headaches, Vomiting, Low Fever.

## 2013-06-02 NOTE — Progress Notes (Signed)
Still with 3-4 times per day with emesis and ptylism

## 2013-06-03 LAB — DRUG SCREEN, URINE, NO CONFIRMATION
Amphetamine Screen, Ur: NEGATIVE
Barbiturate Quant, Ur: NEGATIVE
Benzodiazepines.: NEGATIVE
Marijuana Metabolite: NEGATIVE
Phencyclidine (PCP): NEGATIVE

## 2013-06-03 LAB — URINALYSIS
Leukocytes, UA: NEGATIVE
Specific Gravity, Urine: 1.024 (ref 1.005–1.030)
pH: 6 (ref 5.0–8.0)

## 2013-06-03 LAB — GC/CHLAMYDIA PROBE AMP: CT Probe RNA: NEGATIVE

## 2013-06-03 LAB — OXYCODONE SCREEN, UA, RFLX CONFIRM: Oxycodone Screen, Ur: NEGATIVE ng/mL

## 2013-06-14 ENCOUNTER — Emergency Department (HOSPITAL_COMMUNITY): Payer: Self-pay

## 2013-06-14 ENCOUNTER — Encounter (HOSPITAL_COMMUNITY): Payer: Self-pay | Admitting: *Deleted

## 2013-06-14 ENCOUNTER — Emergency Department (HOSPITAL_COMMUNITY)
Admission: EM | Admit: 2013-06-14 | Discharge: 2013-06-14 | Disposition: A | Discharge status: Home / Self Care | Payer: Self-pay | Attending: Emergency Medicine | Admitting: Emergency Medicine

## 2013-06-14 ENCOUNTER — Encounter (HOSPITAL_COMMUNITY): Payer: Self-pay | Admitting: Emergency Medicine

## 2013-06-14 ENCOUNTER — Inpatient Hospital Stay (HOSPITAL_COMMUNITY)
Admission: AD | Admit: 2013-06-14 | Discharge: 2013-06-15 | Disposition: A | Discharge status: Home / Self Care | Payer: Self-pay | Source: Ambulatory Visit | Attending: Obstetrics & Gynecology | Admitting: Obstetrics & Gynecology

## 2013-06-14 DIAGNOSIS — O200 Threatened abortion: Secondary | ICD-10-CM | POA: Insufficient documentation

## 2013-06-14 DIAGNOSIS — O9989 Other specified diseases and conditions complicating pregnancy, childbirth and the puerperium: Secondary | ICD-10-CM | POA: Insufficient documentation

## 2013-06-14 DIAGNOSIS — Z8669 Personal history of other diseases of the nervous system and sense organs: Secondary | ICD-10-CM | POA: Insufficient documentation

## 2013-06-14 DIAGNOSIS — O3410 Maternal care for benign tumor of corpus uteri, unspecified trimester: Secondary | ICD-10-CM | POA: Insufficient documentation

## 2013-06-14 DIAGNOSIS — D259 Leiomyoma of uterus, unspecified: Secondary | ICD-10-CM | POA: Insufficient documentation

## 2013-06-14 DIAGNOSIS — R110 Nausea: Secondary | ICD-10-CM | POA: Insufficient documentation

## 2013-06-14 DIAGNOSIS — N949 Unspecified condition associated with female genital organs and menstrual cycle: Secondary | ICD-10-CM | POA: Insufficient documentation

## 2013-06-14 LAB — COMPREHENSIVE METABOLIC PANEL
ALT: 12 U/L (ref 0–35)
AST: 16 U/L (ref 0–37)
Albumin: 3 g/dL — ABNORMAL LOW (ref 3.5–5.2)
Calcium: 8.7 mg/dL (ref 8.4–10.5)
Sodium: 134 mEq/L — ABNORMAL LOW (ref 135–145)
Total Protein: 6.9 g/dL (ref 6.0–8.3)

## 2013-06-14 LAB — URINALYSIS, ROUTINE W REFLEX MICROSCOPIC
Bilirubin Urine: NEGATIVE
Nitrite: NEGATIVE
Specific Gravity, Urine: 1.01 (ref 1.005–1.030)
Urobilinogen, UA: 0.2 mg/dL (ref 0.0–1.0)
pH: 6.5 (ref 5.0–8.0)

## 2013-06-14 LAB — CBC WITH DIFFERENTIAL/PLATELET
Basophils Absolute: 0 10*3/uL (ref 0.0–0.1)
Eosinophils Absolute: 0.1 10*3/uL (ref 0.0–0.7)
Eosinophils Relative: 1 % (ref 0–5)
MCH: 29.2 pg (ref 26.0–34.0)
MCV: 83.8 fL (ref 78.0–100.0)
Neutrophils Relative %: 69 % (ref 43–77)
Platelets: 181 10*3/uL (ref 150–400)
RDW: 13.3 % (ref 11.5–15.5)
WBC: 10.7 10*3/uL — ABNORMAL HIGH (ref 4.0–10.5)

## 2013-06-14 MED ORDER — SODIUM CHLORIDE 0.9 % IV BOLUS (SEPSIS)
1000.0000 mL | Freq: Once | INTRAVENOUS | Status: AC
  Administered 2013-06-14: 1000 mL via INTRAVENOUS

## 2013-06-14 MED ORDER — ONDANSETRON HCL 4 MG/2ML IJ SOLN
4.0000 mg | Freq: Once | INTRAMUSCULAR | Status: AC
  Administered 2013-06-14: 4 mg via INTRAVENOUS
  Filled 2013-06-14: qty 2

## 2013-06-14 MED ORDER — MORPHINE SULFATE 4 MG/ML IJ SOLN
4.0000 mg | Freq: Once | INTRAMUSCULAR | Status: AC
  Administered 2013-06-14: 4 mg via INTRAVENOUS
  Filled 2013-06-14: qty 1

## 2013-06-14 NOTE — ED Provider Notes (Addendum)
CSN: [id number]     Arrival date & time 06/14/13  0305 History   First MD Initiated Contact with Patient 06/14/13 0335     Chief Complaint  Patient presents with  . Abdominal Pain  . Nausea   (Consider location/radiation/quality/duration/timing/severity/associated sxs/prior Treatment) The history is provided by the patient. The history is limited by a language barrier. A language interpreter was used Conservation officer, historic buildings).  Alison Mendoza is a 32 y.o. female [redacted] weeks pregnant here with abdominal pain. Lower abdominal pain and vomiting today. Crampy feeling. She "felt like contractions". Denies vaginal bleeding or discharge.    Past Medical History  Diagnosis Date  . Headache(784.0)   . Abnormal Pap smear    Past Surgical History  Procedure Laterality Date  . Cesarean section    . Colposcopy     No family history on file. History  Substance Use Topics  . Smoking status: Never Smoker   . Smokeless tobacco: Never Used  . Alcohol Use: No   OB History   Grav Para Term Preterm Abortions TAB SAB Ect Mult Living   2 1 1       1      Review of Systems  Gastrointestinal: Positive for abdominal pain.  All other systems reviewed and are negative.    Allergies  Review of patient's allergies indicates no known allergies.  Home Medications   Current Outpatient Rx  Name  Route  Sig  Dispense  Refill  . glycopyrrolate (ROBINUL-FORTE) 2 MG tablet   Oral   Take 1 tablet (2 mg total) by mouth 3 (three) times daily.   60 tablet   1   . prenatal vitamin w/FE, FA (PRENATAL 1 + 1) 27-1 MG TABS tablet   Oral   Take 1 tablet by mouth daily at 12 noon.         . promethazine (PHENERGAN) 25 MG tablet   Oral   Take 1 tablet (25 mg total) by mouth every 6 (six) hours as needed for nausea.   30 tablet   2    LMP 02/25/2013 Physical Exam  Nursing note and vitals reviewed. Constitutional: She is oriented to person, place, and time. She appears well-developed and  well-nourished.  Slightly uncomfortable   HENT:  Head: Normocephalic.  Mouth/Throat: Oropharynx is clear and moist.  Eyes: Conjunctivae are normal. Pupils are equal, round, and reactive to light.  Neck: Normal range of motion. Neck supple.  Cardiovascular: Normal rate, regular rhythm and normal heart sounds.   Pulmonary/Chest: Effort normal and breath sounds normal. No respiratory distress. She has no wheezes. She has no rales.  Abdominal: Soft. Bowel sounds are normal.  Mild diffuse lower abdominal tenderness, no rebound   Genitourinary:  Os closed. No uterine or adnexal tenderness. Uterus enlarged.   Musculoskeletal: Normal range of motion.  Neurological: She is alert and oriented to person, place, and time.  Skin: Skin is warm and dry.  Psychiatric: She has a normal mood and affect. Her behavior is normal. Judgment and thought content normal.    ED Course  Procedures (including critical care time) Labs Review Labs Reviewed  CBC WITH DIFFERENTIAL - Abnormal; Notable for the following:    WBC 10.7 (*)    HCT 34.7 (*)    All other components within normal limits  COMPREHENSIVE METABOLIC PANEL - Abnormal; Notable for the following:    Sodium 134 (*)    Potassium 3.2 (*)    Albumin 3.0 (*)    Total Bilirubin 0.2 (*)  All other components within normal limits  HCG, QUANTITATIVE, PREGNANCY - Abnormal; Notable for the following:    hCG, Beta Chain, Quant, S 30865 (*)    All other components within normal limits  URINALYSIS, ROUTINE W REFLEX MICROSCOPIC   Imaging Review US Ob Comp + 14 Wk  06/14/2013   CLINICAL DATA:  Lower abdominal pain. Fifteen weeks 4 days pregnant by last menstrual period.  EXAM: LIMITED OBSTETRIC ULTRASOUND  FINDINGS: Number of Fetuses: 1  Heart Rate:  141 bpm  Movement: Yes  Presentation: Cephalic  Placental Location: Right and lateral  Previa: No  Amniotic Fluid (Subjective):  Within normal limits.  BPD:  2.9cm 15w 2d        EDC: 12/04/13  MATERNAL  FINDINGS:  Cervix:  Appears closed.  Uterus/Adnexae: Mild soft tissue fullness posterior to the placenta on image 27 at 2.7 cm. Favored to represent a uterine fibroid. A retroplacental hemorrhage/abruption felt less likely.  IMPRESSION: 1. Intrauterine pregnancy of 15 weeks 2 days with fetal heart rate of 141 beats per min. 2. Soft tissue fullness within the uterus, posterior the to the placenta. Favor uterine fibroid. Retroplacental hemorrhage/abruption cannot be excluded. If this is a clinical concern, consider ultrasound followup.  This exam is performed on an emergent basis and does not comprehensively evaluate fetal size, dating, or anatomy; follow-up complete OB US should be considered if further fetal assessment is warranted.   Electronically Signed   By: [name]   On: 06/14/2013 05:56    MDM  No diagnosis found. Alison Mendoza is a 32 y.o. female here with ab pain. Will need to confirm IUP and r/o ovarian cysts.   6:25 AM US showed fibroids and possible retroplacental hemorrhage/abruption. I called OB on call, Dr. Ander Slade, who said that she is less than 24 weeks and will not require intervention. I discussed with patient about possibility of miscarriage and that she should f/u with OB and return if she has vaginal bleeding.   Richardean Canal, MD 06/14/13 7846  Richardean Canal, MD 06/14/13 [phone]

## 2013-06-14 NOTE — ED Notes (Signed)
MD informed of pt's update, MD at bedside.

## 2013-06-14 NOTE — ED Notes (Signed)
Per interpretor pt reports that her pain has not decreased at all since we gave her pain medication, pt states her pain is very sharp and she feels like her baby is trying to come out of her. Pt states the nausea medicine did help her. Pt states she is very thirsty. Pt states she does not know why she has this pain.

## 2013-06-14 NOTE — ED Notes (Signed)
Pelvic cart at bedside. 

## 2013-06-14 NOTE — ED Notes (Signed)
Pt c/o abdominal pain and N/V. Pt states she is [redacted] weeks pregnant.

## 2013-06-14 NOTE — MAU Note (Signed)
Patient complains of severe abdominal pain and feels like she is about to have the baby.

## 2013-06-15 ENCOUNTER — Encounter (HOSPITAL_COMMUNITY): Payer: Self-pay | Admitting: Family

## 2013-06-15 DIAGNOSIS — O3410 Maternal care for benign tumor of corpus uteri, unspecified trimester: Secondary | ICD-10-CM

## 2013-06-15 MED ORDER — ACETAMINOPHEN-CODEINE #3 300-30 MG PO TABS
1.0000 | ORAL_TABLET | Freq: Once | ORAL | Status: AC
  Administered 2013-06-15: 1 via ORAL
  Filled 2013-06-15: qty 1

## 2013-06-15 MED ORDER — ACETAMINOPHEN-CODEINE 300-30 MG PO TABS: 1.0000 | ORAL_TABLET | ORAL | Status: DC | PRN

## 2013-06-15 MED ORDER — CYCLOBENZAPRINE HCL 10 MG PO TABS: 10.0000 mg | ORAL_TABLET | Freq: Once | ORAL | Status: DC

## 2013-06-15 NOTE — MAU Provider Note (Signed)
Attestation of Attending Supervision of Advanced Practitioner (CNM/NP): Evaluation and management procedures were performed by the Advanced Practitioner under my supervision and collaboration. I have reviewed the Advanced Practitioner's note and chart, and I agree with the management and plan.  Paddy Walthall H. 7:41 AM   

## 2013-06-15 NOTE — MAU Provider Note (Signed)
  History     CSN: [id number]  Arrival date and time: 06/14/13 2317   First Provider Initiated Contact with Patient 06/15/13 0028      Chief Complaint  Patient presents with  . Pelvic Pain   HPI  Pt is a G2P1001 here at [redacted]w[redacted]d wks with report of lower pelvic pain that started yesterday.  No report of vaginal bleeding or abnormal vaginal discharge.  Denies UTI symptoms.  Seen at [facility] yesterday for similar pain, ultrasound showed a uterine fibroid.  Discharged home with bleeding precautions.  Here for treatment of pain related to fibroid.    Past Medical History  Diagnosis Date  . Headache(784.0)   . Abnormal Pap smear     Past Surgical History  Procedure Laterality Date  . Cesarean section    . Colposcopy      History reviewed. No pertinent family history.  History  Substance Use Topics  . Smoking status: Never Smoker   . Smokeless tobacco: Never Used  . Alcohol Use: No    Allergies: No Known Allergies  Prescriptions prior to admission  Medication Sig Dispense Refill  . glycopyrrolate (ROBINUL-FORTE) 2 MG tablet Take 1 tablet (2 mg total) by mouth 3 (three) times daily.  60 tablet  1  . prenatal vitamin w/FE, FA (PRENATAL 1 + 1) 27-1 MG TABS tablet Take 1 tablet by mouth daily at 12 noon.      . promethazine (PHENERGAN) 25 MG tablet Take 1 tablet (25 mg total) by mouth every 6 (six) hours as needed for nausea.  30 tablet  2    Review of Systems  Gastrointestinal: Positive for abdominal pain (low pelvic pain).  Genitourinary: Negative for dysuria, urgency, hematuria and flank pain.  All other systems reviewed and are negative.   Physical Exam   Blood pressure 106/59, pulse 68, temperature 98.1 F (36.7 C), resp. rate 20, height 4' 11.5" (1.511 m), weight 57.607 kg (127 lb), last menstrual period 02/25/2013.  Physical Exam  Constitutional: She is oriented to person, place, and time. She appears well-developed and well-nourished. No distress.  Appears  uncomfortable  HENT:  Head: Normocephalic.  Neck: Normal range of motion. Neck supple.  Cardiovascular: Normal rate, regular rhythm and normal heart sounds.   Respiratory: Effort normal and breath sounds normal.  GI: There is tenderness (right abdominal area).  Genitourinary: No bleeding around the vagina. Vaginal discharge (mucusy) found.  Neurological: She is alert and oriented to person, place, and time.  Skin: Skin is warm and dry.   FHR 150  MAU Course  Procedures  Tylenol #3 PO > reports decrease in pain  Assessment and Plan  32 yo G2P1001 at [redacted]w[redacted]d wks IUP w/Fibroid Uterus  Plan: Discharge to home RX Tylenol#3 (#15) Schedule appointment at [facility] within the week if pain worsens.    [facility] 06/15/2013, 12:30 AM

## 2013-06-17 ENCOUNTER — Inpatient Hospital Stay (HOSPITAL_COMMUNITY)
Admission: AD | Admit: 2013-06-17 | Discharge: 2013-06-17 | Disposition: A | Discharge status: Home / Self Care | Payer: Self-pay | Source: Ambulatory Visit | Attending: Obstetrics & Gynecology | Admitting: Obstetrics & Gynecology

## 2013-06-17 ENCOUNTER — Encounter (HOSPITAL_COMMUNITY): Payer: Self-pay | Admitting: *Deleted

## 2013-06-17 DIAGNOSIS — K5900 Constipation, unspecified: Secondary | ICD-10-CM

## 2013-06-17 DIAGNOSIS — M545 Low back pain, unspecified: Secondary | ICD-10-CM | POA: Insufficient documentation

## 2013-06-17 DIAGNOSIS — O99891 Other specified diseases and conditions complicating pregnancy: Secondary | ICD-10-CM

## 2013-06-17 DIAGNOSIS — R109 Unspecified abdominal pain: Secondary | ICD-10-CM | POA: Insufficient documentation

## 2013-06-17 HISTORY — DX: Personal history of other infectious and parasitic diseases: Z86.19

## 2013-06-17 LAB — CBC WITH DIFFERENTIAL/PLATELET
Basophils Absolute: 0 10*3/uL (ref 0.0–0.1)
Eosinophils Absolute: 0.1 10*3/uL (ref 0.0–0.7)
Eosinophils Relative: 1 % (ref 0–5)
HCT: 32.4 % — ABNORMAL LOW (ref 36.0–46.0)
Hemoglobin: 11.2 g/dL — ABNORMAL LOW (ref 12.0–15.0)
MCH: 28.9 pg (ref 26.0–34.0)
MCV: 83.5 fL (ref 78.0–100.0)
Monocytes Relative: 8 % (ref 3–12)
Platelets: 176 10*3/uL (ref 150–400)
RDW: 13.5 % (ref 11.5–15.5)
WBC: 9.8 10*3/uL (ref 4.0–10.5)

## 2013-06-17 LAB — URINALYSIS, ROUTINE W REFLEX MICROSCOPIC
Bilirubin Urine: NEGATIVE
Glucose, UA: NEGATIVE mg/dL
Hgb urine dipstick: NEGATIVE
Protein, ur: NEGATIVE mg/dL
Urobilinogen, UA: 0.2 mg/dL (ref 0.0–1.0)

## 2013-06-17 MED ORDER — ONDANSETRON 8 MG PO TBDP
8.0000 mg | ORAL_TABLET | Freq: Once | ORAL | Status: AC
  Administered 2013-06-17: 8 mg via ORAL
  Filled 2013-06-17: qty 1

## 2013-06-17 MED ORDER — HYDROMORPHONE HCL PF 1 MG/ML IJ SOLN
1.0000 mg | Freq: Once | INTRAMUSCULAR | Status: AC
  Administered 2013-06-17: 1 mg via INTRAMUSCULAR
  Filled 2013-06-17: qty 1

## 2013-06-17 MED ORDER — FLEET ENEMA 7-19 GM/118ML RE ENEM: 1.0000 | ENEMA | Freq: Once | RECTAL | Status: DC

## 2013-06-17 MED ORDER — HYDROCODONE-ACETAMINOPHEN 5-325 MG PO TABS: 1.0000 | ORAL_TABLET | Freq: Four times a day (QID) | ORAL | Status: DC | PRN

## 2013-06-17 NOTE — MAU Note (Signed)
Pt c/o right lower abd and low back pain that has been going on since the 27th Sep, says it worsened tonight at 2300 when she took pain med without relief.  Reports vomiting over the past 24 hrs 6X; No BM since last Thursday 25 Sep. Denies vag bleeding, leaking or discharge.

## 2013-06-17 NOTE — MAU Provider Note (Signed)
Attestation of Attending Supervision of Advanced Practitioner (CNM/NP): Evaluation and management procedures were performed by the Advanced Practitioner under my supervision and collaboration.  I have reviewed the Advanced Practitioner's note and chart, and I agree with the management and plan.  HARRAWAY-SMITH, Zenia Guest 7:07 AM     

## 2013-06-17 NOTE — MAU Provider Note (Signed)
History     CSN: [id number]  Arrival date and time: 06/17/13 0139   First Provider Initiated Contact with Patient 06/17/13 0215      No chief complaint on file.  HPI  Alison Mendoza is a 32 y.o. G2P1001 at [redacted]w[redacted]d who presents today with abdominal pain. She states that the pain is all over, but worse on the right. She reports that she has vomited about 6 times today. She is unsure if she has had a fever, but does report occasional chills. She rates her pain 10/10. She states that her last BM was 4 days ago.   Past Medical History  Diagnosis Date  . Headache(784.0)   . Abnormal Pap smear   . History of worms     3 yrs ago    Past Surgical History  Procedure Laterality Date  . Cesarean section    . Colposcopy      History reviewed. No pertinent family history.  History  Substance Use Topics  . Smoking status: Never Smoker   . Smokeless tobacco: Never Used  . Alcohol Use: No    Allergies: No Known Allergies  Prescriptions prior to admission  Medication Sig Dispense Refill  . Acetaminophen-Codeine (TYLENOL/CODEINE #3) 300-30 MG per tablet Take 1 tablet by mouth every 4 (four) hours as needed for pain.  30 tablet  0  . promethazine (PHENERGAN) 25 MG tablet Take 1 tablet (25 mg total) by mouth every 6 (six) hours as needed for nausea.  30 tablet  2  . glycopyrrolate (ROBINUL-FORTE) 2 MG tablet Take 1 tablet (2 mg total) by mouth 3 (three) times daily.  60 tablet  1  . prenatal vitamin w/FE, FA (PRENATAL 1 + 1) 27-1 MG TABS tablet Take 1 tablet by mouth daily at 12 noon.        ROS Physical Exam   Blood pressure 103/66, pulse 73, temperature 99 F (37.2 C), temperature source Oral, resp. rate 30, last menstrual period 02/25/2013, SpO2 100.00%.  Physical Exam  Nursing note and vitals reviewed. Constitutional: She is oriented to person, place, and time. She appears well-developed and well-nourished.  Cardiovascular: Normal rate.   Respiratory: Effort normal.  GI:  Soft. Bowel sounds are normal. There is tenderness (R>L).  Neurological: She is alert and oriented to person, place, and time.  Skin: Skin is warm and dry.  Psychiatric: She has a normal mood and affect.   FHT with doppler  MAU Course  Procedures  Results for orders placed during the hospital encounter of 06/17/13 (from the past 24 hour(s))  URINALYSIS, ROUTINE W REFLEX MICROSCOPIC     Status: Abnormal   Collection Time    06/17/13  1:45 AM      Result Value Range   Color, Urine YELLOW  YELLOW   APPearance CLEAR  CLEAR   Specific Gravity, Urine 1.020  1.005 - 1.030   pH 6.0  5.0 - 8.0   Glucose, UA NEGATIVE  NEGATIVE mg/dL   Hgb urine dipstick NEGATIVE  NEGATIVE   Bilirubin Urine NEGATIVE  NEGATIVE   Ketones, ur 40 (*) NEGATIVE mg/dL   Protein, ur NEGATIVE  NEGATIVE mg/dL   Urobilinogen, UA 0.2  0.0 - 1.0 mg/dL   Nitrite NEGATIVE  NEGATIVE   Leukocytes, UA NEGATIVE  NEGATIVE  CBC WITH DIFFERENTIAL     Status: Abnormal   Collection Time    06/17/13  2:50 AM      Result Value Range   WBC 9.8  4.0 - 10.5  K/uL   RBC 3.88  3.87 - 5.11 MIL/uL   Hemoglobin 11.2 (*) 12.0 - 15.0 g/dL   HCT 65.7 (*) 84.6 - 96.2 %   MCV 83.5  78.0 - 100.0 fL   MCH 28.9  26.0 - 34.0 pg   MCHC 34.6  30.0 - 36.0 g/dL   RDW 95.2  84.1 - 32.4 %   Platelets 176  150 - 400 K/uL   Neutrophils Relative % 73  43 - 77 %   Neutro Abs 7.1  1.7 - 7.7 K/uL   Lymphocytes Relative 19  12 - 46 %   Lymphs Abs 1.8  0.7 - 4.0 K/uL   Monocytes Relative 8  3 - 12 %   Monocytes Absolute 0.7  0.1 - 1.0 K/uL   Eosinophils Relative 1  0 - 5 %   Eosinophils Absolute 0.1  0.0 - 0.7 K/uL   Basophils Relative 0  0 - 1 %   Basophils Absolute 0.0  0.0 - 0.1 K/uL    0350: Patient reports results from the enema, and a marked decrease in abdominal pain.   Assessment and Plan   1. Constipation in pregnancy in second trimester   2. Round ligament pain   Comfort measures discussed High fiber diet Stop taking  narcotic   Tawnya Crook 06/17/2013, 2:40 AM

## 2013-06-17 NOTE — MAU Note (Signed)
PT HAD GOOD RESULTS FROM ENEMA.  USED   INTERPRETER -  DEBBIE.

## 2013-06-18 ENCOUNTER — Ambulatory Visit (INDEPENDENT_AMBULATORY_CARE_PROVIDER_SITE_OTHER): Payer: Self-pay | Admitting: Obstetrics and Gynecology

## 2013-06-18 VITALS — BP 110/80 | Wt 126.0 lb

## 2013-06-18 DIAGNOSIS — Z1389 Encounter for screening for other disorder: Secondary | ICD-10-CM

## 2013-06-18 DIAGNOSIS — O99019 Anemia complicating pregnancy, unspecified trimester: Secondary | ICD-10-CM

## 2013-06-18 DIAGNOSIS — O26899 Other specified pregnancy related conditions, unspecified trimester: Secondary | ICD-10-CM | POA: Insufficient documentation

## 2013-06-18 DIAGNOSIS — O34219 Maternal care for unspecified type scar from previous cesarean delivery: Secondary | ICD-10-CM

## 2013-06-18 MED ORDER — HYDROCODONE-ACETAMINOPHEN 5-325 MG PO TABS: 1.0000 | ORAL_TABLET | Freq: Four times a day (QID) | ORAL | Status: DC | PRN

## 2013-06-18 NOTE — Progress Notes (Signed)
Pt here for abdominal pain. Pt states she has had the pain since Friday, nothing makes it better, it just keeps getting worse. PT states it feels like shse needs to have a BM and she can't, and it feels like the baby is coming out. Went to [facility] on Friday. Pt states that she was told that she is probably going to miscarry. PT was told that she was bleeding internally but has had no vaginal bleeding. Pt was given pain medication, it helps the pain some but then comes back.

## 2013-06-18 NOTE — Progress Notes (Signed)
cx long closed uterus tone normal. No bleeding .  U/s reviewed: retroplacental clot vs fibroid P out of wk x 2 wk   Schedule u/s rechk 2 wk.

## 2013-06-18 NOTE — Patient Instructions (Addendum)
No work x 2 wk, keep appt for 10 oct.

## 2013-06-24 ENCOUNTER — Other Ambulatory Visit: Payer: Self-pay | Admitting: Obstetrics and Gynecology

## 2013-06-24 DIAGNOSIS — O26899 Other specified pregnancy related conditions, unspecified trimester: Secondary | ICD-10-CM

## 2013-06-27 ENCOUNTER — Ambulatory Visit (INDEPENDENT_AMBULATORY_CARE_PROVIDER_SITE_OTHER): Payer: Self-pay | Admitting: Obstetrics and Gynecology

## 2013-06-27 ENCOUNTER — Encounter: Payer: Self-pay | Admitting: Obstetrics and Gynecology

## 2013-06-27 ENCOUNTER — Ambulatory Visit (INDEPENDENT_AMBULATORY_CARE_PROVIDER_SITE_OTHER): Payer: Self-pay

## 2013-06-27 VITALS — BP 110/62 | Wt 125.0 lb

## 2013-06-27 DIAGNOSIS — O3410 Maternal care for benign tumor of corpus uteri, unspecified trimester: Secondary | ICD-10-CM

## 2013-06-27 DIAGNOSIS — O3412 Maternal care for benign tumor of corpus uteri, second trimester: Secondary | ICD-10-CM

## 2013-06-27 DIAGNOSIS — O9989 Other specified diseases and conditions complicating pregnancy, childbirth and the puerperium: Secondary | ICD-10-CM

## 2013-06-27 DIAGNOSIS — O26899 Other specified pregnancy related conditions, unspecified trimester: Secondary | ICD-10-CM

## 2013-06-27 DIAGNOSIS — Z1389 Encounter for screening for other disorder: Secondary | ICD-10-CM

## 2013-06-27 DIAGNOSIS — O34219 Maternal care for unspecified type scar from previous cesarean delivery: Secondary | ICD-10-CM

## 2013-06-27 DIAGNOSIS — O99019 Anemia complicating pregnancy, unspecified trimester: Secondary | ICD-10-CM

## 2013-06-27 DIAGNOSIS — O219 Vomiting of pregnancy, unspecified: Secondary | ICD-10-CM

## 2013-06-27 DIAGNOSIS — N949 Unspecified condition associated with female genital organs and menstrual cycle: Secondary | ICD-10-CM

## 2013-06-27 LAB — POCT URINALYSIS DIPSTICK
Blood, UA: NEGATIVE
Glucose, UA: NEGATIVE
Glucose, UA: NEGATIVE
Ketones, UA: NEGATIVE
Leukocytes, UA: NEGATIVE
Leukocytes, UA: NEGATIVE
Nitrite, UA: NEGATIVE
Nitrite, UA: NEGATIVE
Protein, UA: NEGATIVE
Protein, UA: NEGATIVE

## 2013-06-27 MED ORDER — GLYCOPYRROLATE 2 MG PO TABS: 2.0000 mg | ORAL_TABLET | Freq: Three times a day (TID) | ORAL | Status: DC

## 2013-06-27 MED ORDER — HYDROCODONE-ACETAMINOPHEN 5-325 MG PO TABS: 1.0000 | ORAL_TABLET | Freq: Four times a day (QID) | ORAL | Status: DC | PRN

## 2013-06-27 NOTE — Progress Notes (Signed)
U/S(16+5wks)-active fetus, FHR-141bpm, cx long and closed (3.3cm), bilateral adnexa wnl, post gr 0 plac with posterior uterine fibroid noted=29 x 19mm, fluid wnl, will do complete anatomy screen at ~[redacted] weeks gestation

## 2013-06-27 NOTE — Patient Instructions (Signed)
Out of work x 4 more wk

## 2013-06-27 NOTE — Progress Notes (Signed)
prob 1: retroplacental shadow:: small fibroid, stable prob 2 continued nausea, with salivation: has rx for robinul and phenergan, pt informed that refils available prob3   Desires continuation of work leave of absence due to discomfort , will extend x 4wk. Pt not receiving disability. Also pt had ? Of gender of baby : too early to be sure on u.s.

## 2013-06-27 NOTE — Progress Notes (Signed)
Pt here today for routine visit and [place]. PT states that she is having pain in her lower abdominal area and she has had a small amount of bleeding.

## 2013-07-21 ENCOUNTER — Other Ambulatory Visit: Payer: Self-pay | Admitting: Obstetrics and Gynecology

## 2013-07-24 ENCOUNTER — Other Ambulatory Visit: Payer: Self-pay | Admitting: Obstetrics and Gynecology

## 2013-07-24 DIAGNOSIS — Z1389 Encounter for screening for other disorder: Secondary | ICD-10-CM

## 2013-07-25 ENCOUNTER — Ambulatory Visit (INDEPENDENT_AMBULATORY_CARE_PROVIDER_SITE_OTHER): Payer: Self-pay

## 2013-07-25 ENCOUNTER — Ambulatory Visit (INDEPENDENT_AMBULATORY_CARE_PROVIDER_SITE_OTHER): Payer: Self-pay | Admitting: Obstetrics and Gynecology

## 2013-07-25 ENCOUNTER — Other Ambulatory Visit: Payer: Self-pay | Admitting: Obstetrics and Gynecology

## 2013-07-25 ENCOUNTER — Encounter: Payer: Self-pay | Admitting: Obstetrics and Gynecology

## 2013-07-25 VITALS — BP 120/74 | Wt 136.0 lb

## 2013-07-25 DIAGNOSIS — Z1389 Encounter for screening for other disorder: Secondary | ICD-10-CM

## 2013-07-25 DIAGNOSIS — O99019 Anemia complicating pregnancy, unspecified trimester: Secondary | ICD-10-CM

## 2013-07-25 DIAGNOSIS — Z363 Encounter for antenatal screening for malformations: Secondary | ICD-10-CM

## 2013-07-25 DIAGNOSIS — O34219 Maternal care for unspecified type scar from previous cesarean delivery: Secondary | ICD-10-CM

## 2013-07-25 LAB — POCT URINALYSIS DIPSTICK
Blood, UA: NEGATIVE
Glucose, UA: NEGATIVE
Ketones, UA: NEGATIVE
Nitrite, UA: NEGATIVE

## 2013-07-25 MED ORDER — PRENATAL PLUS 27-1 MG PO TABS: 1.0000 | ORAL_TABLET | Freq: Every day | ORAL | Status: DC

## 2013-07-25 NOTE — Progress Notes (Signed)
Pt for routine visit, Pt denies an problems or concerns at this time. U/s reviewed .

## 2013-07-25 NOTE — Progress Notes (Signed)
U/S(20+5wks)- active fetus, meas c/w dates, FHR-149 bpm, cx long and closed (3.3cm), bilateral adnexa wnl, posterior fibroid=2.6cm, posterior Gr 0 placenta, no major abnl noted, female fetus

## 2013-07-25 NOTE — Patient Instructions (Signed)
Su receta es al [employer].  por [employer], por nausea et vitaminas

## 2013-08-22 ENCOUNTER — Ambulatory Visit (INDEPENDENT_AMBULATORY_CARE_PROVIDER_SITE_OTHER): Payer: Self-pay | Admitting: Obstetrics and Gynecology

## 2013-08-22 VITALS — BP 100/60 | Temp 98.3°F | Wt 140.0 lb

## 2013-08-22 DIAGNOSIS — Z331 Pregnant state, incidental: Secondary | ICD-10-CM

## 2013-08-22 DIAGNOSIS — Z1389 Encounter for screening for other disorder: Secondary | ICD-10-CM

## 2013-08-22 DIAGNOSIS — J069 Acute upper respiratory infection, unspecified: Secondary | ICD-10-CM

## 2013-08-22 DIAGNOSIS — O219 Vomiting of pregnancy, unspecified: Secondary | ICD-10-CM

## 2013-08-22 DIAGNOSIS — Z3482 Encounter for supervision of other normal pregnancy, second trimester: Secondary | ICD-10-CM

## 2013-08-22 DIAGNOSIS — O99019 Anemia complicating pregnancy, unspecified trimester: Secondary | ICD-10-CM

## 2013-08-22 DIAGNOSIS — O34219 Maternal care for unspecified type scar from previous cesarean delivery: Secondary | ICD-10-CM

## 2013-08-22 LAB — POCT URINALYSIS DIPSTICK
Ketones, UA: NEGATIVE
Leukocytes, UA: NEGATIVE

## 2013-08-22 MED ORDER — PROMETHAZINE HCL 25 MG PO TABS: 25.0000 mg | ORAL_TABLET | Freq: Four times a day (QID) | ORAL | Status: DC | PRN

## 2013-08-22 NOTE — Patient Instructions (Addendum)
Take Rx to [facility] for filling i  Parto vaginal luego de una cesrea (Vaginal Birth After Cesarean Delivery) Un parto vaginal luego de un parto por cesrea es dar a luz por la vagina luego de haber dado a luz por medio de una intervencin [place]. En el pasado, si una mujer tena un beb por cesrea, todos los partos posteriores deban hacerse por cesrea. Esto ya no es as. Puede ser seguro para la mam intentar un parto vaginal luego de una cesrea. La decisin final de tener un parto vaginal o por cesrea debe tomarse en conjunto, entre la paciente y el mdico. [place] riesgos y los beneficios debern evaluarse con relacin a los motivos y al tipo de cesrea previa LAS MUJERES QUE QUIEREN TENER UN PARTO VAGINAL, DEBEN CONSULTAR CON SU MDICO PARA ASEGURARSE QUE:  La cesrea anterior se realiz con una incisin uterina transversal (no con una incisin vertical clsica).  El canal de parto es lo suficientemente grande como para que pase el [place]  .  No ha sido sometida a otras operaciones del tero.  Durante el trabajo de parto le realizarn un monitoreo electrnico fetal, en todo momento.  Es necesario que haya un quirfano disponible y listo en caso de necesitar una cesrea de emergencia.  Un cirujano y personal de quirfano estarn disponibles en todo momento durante el [place] de parto, para realizar una cesrea en caso de ser necesario.  Habr un anestesista disponible en caso de necesitar una cesrea de emergencia.  La nursery est lista cuenta con personal especializado y el equipo disponible para cuidar al beb en caso de emergencia. BENEFICIOS  Permanencia ms breve en el hospital.  Menores costos en el parto, la nurse y el hospital.  Menos prdida de sangre y menos probabilidad de necesitar una transfusin sangunea.  Menos probabilidad de tener fiebre o molestias como consecuencia de una ciruga mayor  Menos riesgo de cogulos sanguneos.  Menos riesgo de  sufrir infecciones.  Recuperacin ms rpida luego del alta mdica.  Menos riesgo de complicaciones quirrgicas, como apertura o hernia de la incisin.  Disminucin del riesgo de lesiones a otros rganos  Menor riesgo de remocin del tero (histerectoma)  Menor riesgo de que la placenta cubra parcial o completamente la abertura del tero (placenta previa) en embarazos futuros  Posibilidad de tener una familia grande, si lo desea. RIESGOS  Ruptura del tero.  Si el tero se rompe Production manager.  Todas las complicaciones de [place] mayor y lesiones en otros rganos.  Hemorragia excesiva, cogulos e infeccin.  Lower Apgar Puntuacin Apgar baja (mtodo que evala al recin nacido segn su apariencia, pulso, muecas, actividad y respiracion) y ms riesgos para el beb.  Hay mas riesgo de ruptura del tero si se induce o aumenta el trabajo de [place].  Hay un mayor riesgo de ruptura uterina si se usan medicamentos para madurar el cuello. NO DEBE LLEVARSE A CABO SI:  La cesrea previa se realiz con una incicin vertical (clsica) o con forma de T, o usted no sabe cul de [employer] han practicado.  Ha sufrido ruptura del tero.  Le han practicado una ciruga de tero.  Tiene problemas mdicos u obsttricos.  El beb est en problemas.  Tuvo dos cesreas previas y ningn parto vaginal. OTRAS COSAS QUE DEBE SABER:  La anestesia peridural es segura.  Es seguro dar vuelta al beb si se encuentra de nalgas (intentar una versin ceflica externa).  Es seguro intentarlo en caso de  mellizos.  Los embarazos de ms de 40 semanas no tienen xito con este procedimiento.  Hay un aumento de fracasos en embarazadas obesas.  Hay un aumento del porcentaje de fracasos si el beb pesa 4 Kg o ms.  Hay aumento en el porcentaje de fracasos si el intervalo entre la operacin cesrea y el parto vaginal es de menos de 19 meses.  Hay un aumento en el porcentaje de  fracasos si ha sufrido preeclampsia hipertensin arterial, protenas en la orina e hinchazn del rostro y las extremidades.  El parto vaginal ser muy exitoso si tuvo un parto vaginal previo.  Tambin es [employer] caso que el [place] de parto comience espontneamente antes de la fecha.  El parto vaginal luego de [place]omia Dear cesrea es similar a un parto espontneo vaginal normal. Es importante que converse con su mdico desde comienzos del Psychiatrist de modo que pueda [employer], beneficios y opciones. De este modo tendr tiempo de decidir que es lo mejor en su caso particular en relacin a su parto por cesrea anterior. Hay que tener en cuenta que puede haber cambios en la madre durante el [place], lo que hace necesario cambiar su decisin o la del mdico. Los consejos, preocupaciones y decisiones debern documentarse en la historia clnica y debe ser firmada por todas las partes. Document Released: 02/21/2008 Document Revised: 11/27/2011 [facility] Patient Information 2014 [place], [place].

## 2013-08-22 NOTE — Progress Notes (Signed)
Pt here for routine visit. Pt is still having some bleeding. Pt also having cold symptoms, coughing,and fever.

## 2013-08-22 NOTE — Progress Notes (Signed)
CC: Head congestion & cold x 3 day. Nocturnal coughing . +nasal congestion. Good FM, no Ob complaints.

## 2013-09-18 NOTE — L&D Delivery Note (Signed)
Delivery Note At 5:59 AM a viable female was delivered via VBAC, Spontaneous (Presentation: ; Occiput Anterior).  APGAR: 9, 9; weight 8 lb 0.2 oz (3635 g).   Placenta status: Intact, Spontaneous.  Cord: 3 vessels with the following complications: None.  Cord pH: not sent.  Anesthesia: Epidural  Episiotomy: None Lacerations: 2nd degree;Perineal Suture Repair: 3.0 vicryl Est. Blood Loss (mL): 250  Mom to postpartum.  Baby to Couplet care / Skin to Skin.  Ms. Minner was admitted for TOLAC at term after she began having contractions associated with light bleeding. She proceeded to have a foley bulb placed and, after it was out, was augmented with pitocin and then progressed to crowning. She pushed for a short while, and after 6-7 pushes with good maternal effort, the head of a viable fetus presented, followed by anterior shoulder, and then the body. Crying baby placed on mother's chest. Cord clamped x2 and cut by FOB. Cord blood drawn for gas, but not sent as baby had vigorous cries. Placenta delivered intact with 3V cord. 2nd degree perineal tear repaired in the usual fashion by myself.  Dr. Ebbie Latus supervised the delivery and repair. Hemostatic. Counts correct x2.    Jacques Earthly 12/09/2013, 6:39 AM  I was present for and supervised the delivery of this newborn. I agree with above documentation.   Pennye Beeghly, Eugenia Pancoast, MD

## 2013-09-19 ENCOUNTER — Other Ambulatory Visit: Payer: Self-pay

## 2013-09-19 ENCOUNTER — Encounter: Payer: Self-pay | Admitting: Obstetrics and Gynecology

## 2013-09-19 ENCOUNTER — Ambulatory Visit (INDEPENDENT_AMBULATORY_CARE_PROVIDER_SITE_OTHER): Payer: Self-pay | Admitting: Obstetrics and Gynecology

## 2013-09-19 VITALS — BP 100/60 | Wt 146.0 lb

## 2013-09-19 DIAGNOSIS — Z1389 Encounter for screening for other disorder: Secondary | ICD-10-CM

## 2013-09-19 DIAGNOSIS — O26899 Other specified pregnancy related conditions, unspecified trimester: Secondary | ICD-10-CM

## 2013-09-19 DIAGNOSIS — O99019 Anemia complicating pregnancy, unspecified trimester: Secondary | ICD-10-CM

## 2013-09-19 DIAGNOSIS — Z3483 Encounter for supervision of other normal pregnancy, third trimester: Secondary | ICD-10-CM

## 2013-09-19 DIAGNOSIS — R109 Unspecified abdominal pain: Secondary | ICD-10-CM

## 2013-09-19 DIAGNOSIS — Z331 Pregnant state, incidental: Secondary | ICD-10-CM

## 2013-09-19 DIAGNOSIS — Z98891 History of uterine scar from previous surgery: Secondary | ICD-10-CM

## 2013-09-19 DIAGNOSIS — O34219 Maternal care for unspecified type scar from previous cesarean delivery: Secondary | ICD-10-CM | POA: Insufficient documentation

## 2013-09-19 LAB — CBC
HCT: 35.8 % — ABNORMAL LOW (ref 36.0–46.0)
Hemoglobin: 12 g/dL (ref 12.0–15.0)
MCH: 28.6 pg (ref 26.0–34.0)
MCHC: 33.5 g/dL (ref 30.0–36.0)
MCV: 85.4 fL (ref 78.0–100.0)
Platelets: 206 10*3/uL (ref 150–400)
RBC: 4.19 MIL/uL (ref 3.87–5.11)
RDW: 14.3 % (ref 11.5–15.5)
WBC: 10 10*3/uL (ref 4.0–10.5)

## 2013-09-19 LAB — POCT URINALYSIS DIPSTICK
Blood, UA: NEGATIVE
GLUCOSE UA: NEGATIVE
KETONES UA: NEGATIVE
LEUKOCYTES UA: NEGATIVE
Nitrite, UA: NEGATIVE
Protein, UA: NEGATIVE

## 2013-09-19 NOTE — Progress Notes (Signed)
Pt concerned re: cost of labs. Will eliminate testing of HSV,  VBAC forms not returned, will discuss at f/u. Pt knows to bring forms back.

## 2013-09-19 NOTE — Progress Notes (Signed)
Pt denies any problems or concerns at this time.  

## 2013-09-20 LAB — RPR

## 2013-09-20 LAB — GLUCOSE TOLERANCE, 2 HOURS W/ 1HR
Glucose, 1 hour: 89 mg/dL (ref 70–170)
Glucose, 2 hour: 91 mg/dL (ref 70–139)
Glucose, Fasting: 87 mg/dL (ref 70–99)

## 2013-09-20 LAB — HIV ANTIBODY (ROUTINE TESTING W REFLEX): HIV: NONREACTIVE

## 2013-09-20 LAB — ANTIBODY SCREEN: Antibody Screen: NEGATIVE

## 2013-09-21 ENCOUNTER — Encounter: Payer: Self-pay | Admitting: Obstetrics and Gynecology

## 2013-09-22 LAB — HSV 2 ANTIBODY, IGG: HSV 2 Glycoprotein G Ab, IgG: 5.33 IV — ABNORMAL HIGH

## 2013-10-17 ENCOUNTER — Ambulatory Visit (INDEPENDENT_AMBULATORY_CARE_PROVIDER_SITE_OTHER): Payer: Self-pay | Admitting: Obstetrics and Gynecology

## 2013-10-17 VITALS — BP 98/60 | Temp 98.5°F | Wt 151.0 lb

## 2013-10-17 DIAGNOSIS — Z98891 History of uterine scar from previous surgery: Secondary | ICD-10-CM

## 2013-10-17 DIAGNOSIS — O34219 Maternal care for unspecified type scar from previous cesarean delivery: Secondary | ICD-10-CM

## 2013-10-17 DIAGNOSIS — O99019 Anemia complicating pregnancy, unspecified trimester: Secondary | ICD-10-CM

## 2013-10-17 DIAGNOSIS — Z1389 Encounter for screening for other disorder: Secondary | ICD-10-CM

## 2013-10-17 DIAGNOSIS — Z331 Pregnant state, incidental: Secondary | ICD-10-CM

## 2013-10-17 DIAGNOSIS — Z3480 Encounter for supervision of other normal pregnancy, unspecified trimester: Secondary | ICD-10-CM

## 2013-10-17 LAB — POCT URINALYSIS DIPSTICK
Blood, UA: NEGATIVE
GLUCOSE UA: NEGATIVE
Ketones, UA: NEGATIVE
LEUKOCYTES UA: NEGATIVE
NITRITE UA: NEGATIVE
Protein, UA: 1

## 2013-10-17 NOTE — Patient Instructions (Signed)
Parto vaginal despus de una cesrea (Vaginal Birth After Cesarean Delivery) Un parto vaginal despus de un parto por cesrea es dar a luz por la vagina despus de haber dado a luz por medio de una intervencin quirrgica. En el pasado, si una mujer tena un beb por cesrea, todos los partos posteriores deban hacerse por cesrea. Esto ya no es as. Puede ser seguro para la mam intentar un parto vaginal luego de una cesrea.  Es importante que converse con su mdico desde comienzos del embarazo de modo que pueda comprender los riesgos, beneficios y opciones. Le dar tiempo para decidir qu es lo mejor en su caso particular. La decisin final de tener un parto vaginal o por cesrea debe tomarse en conjunto, entre usted y el mdico. Cualquier cambio en su salud o la de su beb durante el embarazo puede ser motivo de un cambio de decisin respecto del parto vaginal.  LAS MUJERES QUE OPTAN POR EL PARTO VAGINAL, DEBEN CONSULTAR AL MDICO PARA ASEGURARSE DE QUE:  La cesrea anterior se haya realizado con un corte (incisin) uterino transversal (no con una incisin vertical clsica).  El canal de parto es lo suficientemente grande como para que pase el nio.  No ha sido sometida a otras operaciones del tero.  Durante el trabajo de parto, le realizarn un monitoreo fetal electrnico, en todo momento.  Habr un quirfano disponible y listo en caso de necesitar una cesrea de emergencia.  Un mdico y personal de quirfano estarn disponibles en todo momento durante el trabajo de parto, para realizar una cesrea en caso de ser necesario.  Habr un anestesista disponible en caso de necesitar una cesrea de emergencia.  La nursery est lista y cuenta con personal especializado y el equipo disponible para cuidar al beb en caso de emergencia. BENEFICIOS DEL PARTO VAGINAL:  Permanencia ms breve en el hospital.  Prevencin de los riesgos asociados con el parto por cesrea, por ejemplo:  Complicaciones  quirrgicas, como apertura o hernia de la incisin.  Lesiones en otros rganos.  Fiebre. Esto puede ocurrir si aparece una infeccin despus de la ciruga. Tambin puede ocurrir como reaccin a los medicamentos administrados para adormecerla durante la ciruga.  Menos prdida de sangre y menos probabilidad de necesitar una transfusin sangunea.  Menor riesgo de cogulos sanguneos e infeccin.  Tiempo ms corto de recuperacin.  Menor riesgo de remocin del tero (histerectoma).  Menor riesgo de que la placenta cubra parcial o completamente la abertura del tero (placenta previa) en embarazos futuros.  Menos riesgos en el trabajo de parto y el parto futuros. RIESGOS  Ruptura del tero. Esto ocurre en menos del 1% de los partos vaginales. El riesgo de que eso suceda es mayor si:  Se toman medidas para iniciar el proceso del trabajo de parto (inducir el parto) o estimular o intensificar las contracciones (aumentar el trabajo de parto).  Se usan medicamentos para ablandar (madurar) el cuello del tero.  Es necesario extraer el tero (histerectoma) si se rompe. No debe llevarse a cabo si:  La cesrea previa se realiz con una incisin vertical (clsica) o con forma de T, o usted no sabe cul de ellas le han practicado.  Ha sufrido ruptura del tero.  Ha tenido ciertos tipos de ciruga en el tero, como la extirpacin de fibromas uterinos. Pregntele a su mdico sobre otros tipos de cirugas que le impiden tener un parto vaginal.  Tiene ciertos problemas mdicos o relacionados con el parto (obsttricos).  El beb est en   uterinos. Pregúntele a su médico sobre otros tipos de cirugías que le impiden tener un parto vaginal.  · Tiene ciertos problemas médicos o relacionados con el parto (obstétricos).  · El bebé está en problemas.  · Tuvo dos cesáreas previas y ningún parto vaginal.  OTRAS COSAS QUE DEBE SABER:  · La anestesia peridural es segura.  · Es seguro dar vuelta al bebé si se encuentra de nalgas (intentar una versión cefálica externa).  · Es seguro intentarlo en caso de mellizos.  · El parto vaginal puede no ser apropiado si el bebé pesa 8,8 lb (4 kg) o más. Sin embargo,  las predicciones de peso no son siempre exactas y no deben ser lo único a tenerse en cuenta para decidir si el parto vaginal es lo indicado para usted.  · Hay aumento en el porcentaje de fracasos si el intervalo entre la cesárea y el parto vaginal es de menos de 19 meses.  · Su médico puede aconsejarle no tener un parto vaginal si tiene preeclampsia (hipertensión, proteína en la orina e hinchazón en la cara y las extremidades).  · El parto vaginal suele ser exitoso si ya tuvo un parto vaginal previamente.  · También suele ser exitoso cuando el trabajo de parto comienza espontáneamente antes de la fecha.  · El parto vaginal después de una cesárea es similar a un parto espontáneo vaginal normal.  Document Released: 02/21/2008 Document Revised: 06/25/2013  ExitCare® Patient Information ©2014 [employer].

## 2013-10-17 NOTE — Progress Notes (Signed)
Pt states that she has had a low grade fever the past few days and that she does not feel good at all.

## 2013-10-17 NOTE — Progress Notes (Signed)
[redacted]w[redacted]d. Good FM. No concerning fluid or bloody discharge. No other complaints. Pt expresses that she wants VBAC. +fever, HA, improving. Pt's concerns addressed to apparent satisfaction. Sxs most likely viral. Encouraged OTC Robitussin use.   Given consent feedback in Spanish to return for signing at return visit.   This chart was scribed by Jenne Campus, Medical Scribe, for Dr. Mallory Shirk on 10/17/13 at 11:15 AM. This chart was reviewed by Dr. Mallory Shirk and is accurate.

## 2013-10-18 ENCOUNTER — Other Ambulatory Visit: Payer: Self-pay | Admitting: Obstetrics and Gynecology

## 2013-10-24 ENCOUNTER — Encounter (HOSPITAL_COMMUNITY): Payer: Self-pay | Admitting: Emergency Medicine

## 2013-10-24 ENCOUNTER — Observation Stay (HOSPITAL_COMMUNITY)
Admission: EM | Admit: 2013-10-24 | Discharge: 2013-10-25 | Disposition: A | Discharge status: Home / Self Care | Payer: No Typology Code available for payment source | Attending: Obstetrics & Gynecology | Admitting: Obstetrics & Gynecology

## 2013-10-24 DIAGNOSIS — O9989 Other specified diseases and conditions complicating pregnancy, childbirth and the puerperium: Secondary | ICD-10-CM

## 2013-10-24 DIAGNOSIS — O4700 False labor before 37 completed weeks of gestation, unspecified trimester: Principal | ICD-10-CM | POA: Insufficient documentation

## 2013-10-24 DIAGNOSIS — Y92410 Unspecified street and highway as the place of occurrence of the external cause: Secondary | ICD-10-CM | POA: Diagnosis not present

## 2013-10-24 DIAGNOSIS — M542 Cervicalgia: Secondary | ICD-10-CM | POA: Diagnosis not present

## 2013-10-24 DIAGNOSIS — M549 Dorsalgia, unspecified: Secondary | ICD-10-CM | POA: Insufficient documentation

## 2013-10-24 DIAGNOSIS — O99891 Other specified diseases and conditions complicating pregnancy: Secondary | ICD-10-CM | POA: Insufficient documentation

## 2013-10-24 DIAGNOSIS — M25519 Pain in unspecified shoulder: Secondary | ICD-10-CM | POA: Diagnosis not present

## 2013-10-24 DIAGNOSIS — R109 Unspecified abdominal pain: Secondary | ICD-10-CM | POA: Diagnosis not present

## 2013-10-24 DIAGNOSIS — O368190 Decreased fetal movements, unspecified trimester, not applicable or unspecified: Secondary | ICD-10-CM | POA: Insufficient documentation

## 2013-10-24 DIAGNOSIS — Z3490 Encounter for supervision of normal pregnancy, unspecified, unspecified trimester: Secondary | ICD-10-CM

## 2013-10-24 LAB — CBC
HEMATOCRIT: 36.3 % (ref 36.0–46.0)
Hemoglobin: 12.3 g/dL (ref 12.0–15.0)
MCH: 28.7 pg (ref 26.0–34.0)
MCHC: 33.9 g/dL (ref 30.0–36.0)
MCV: 84.8 fL (ref 78.0–100.0)
Platelets: 184 10*3/uL (ref 150–400)
RBC: 4.28 MIL/uL (ref 3.87–5.11)
RDW: 14.3 % (ref 11.5–15.5)
WBC: 9.4 10*3/uL (ref 4.0–10.5)

## 2013-10-24 LAB — TYPE AND SCREEN
ABO/RH(D): O POS
Antibody Screen: NEGATIVE

## 2013-10-24 LAB — ABO/RH: ABO/RH(D): O POS

## 2013-10-24 MED ORDER — ACETAMINOPHEN 325 MG PO TABS
650.0000 mg | ORAL_TABLET | Freq: Four times a day (QID) | ORAL | Status: DC | PRN
  Administered 2013-10-24 – 2013-10-25 (×3): 650 mg via ORAL
  Filled 2013-10-24 (×3): qty 2

## 2013-10-24 MED ORDER — ONDANSETRON HCL 4 MG PO TABS: 8.0000 mg | ORAL_TABLET | Freq: Three times a day (TID) | ORAL | Status: DC | PRN

## 2013-10-24 MED ORDER — LACTATED RINGERS IV SOLN: INTRAVENOUS | Status: DC

## 2013-10-24 MED ORDER — LACTATED RINGERS IV SOLN
INTRAVENOUS | Status: DC
  Administered 2013-10-24 – 2013-10-25 (×2): via INTRAVENOUS

## 2013-10-24 MED ORDER — CYCLOBENZAPRINE HCL 5 MG PO TABS
5.0000 mg | ORAL_TABLET | Freq: Three times a day (TID) | ORAL | Status: DC | PRN
  Administered 2013-10-24 – 2013-10-25 (×3): 5 mg via ORAL
  Filled 2013-10-24: qty 1

## 2013-10-24 MED ORDER — DOCUSATE SODIUM 100 MG PO CAPS
100.0000 mg | ORAL_CAPSULE | Freq: Every day | ORAL | Status: DC
  Administered 2013-10-25: 100 mg via ORAL
  Filled 2013-10-24: qty 1

## 2013-10-24 MED ORDER — CALCIUM CARBONATE ANTACID 500 MG PO CHEW: 2.0000 | CHEWABLE_TABLET | ORAL | Status: DC | PRN

## 2013-10-24 MED ORDER — PRENATAL MULTIVITAMIN CH
1.0000 | ORAL_TABLET | Freq: Every day | ORAL | Status: DC
  Administered 2013-10-25: 1 via ORAL
  Filled 2013-10-24: qty 1

## 2013-10-24 MED ORDER — ZOLPIDEM TARTRATE 5 MG PO TABS: 5.0000 mg | ORAL_TABLET | Freq: Every evening | ORAL | Status: DC | PRN
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
## 2013-10-24 NOTE — ED Notes (Signed)
[facility] called for transfer, a few patients ahead of this patient to be transferred.
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
## 2013-10-24 NOTE — Progress Notes (Signed)
POC to observe tonight in Women's Antenatal Unit. Antenatal, MAU contacted and updated on transfer via [place] by [place].
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
## 2013-10-24 NOTE — ED Notes (Signed)
Pt able to ambulate to bathroom without assistance

## 2013-10-24 NOTE — ED Notes (Signed)
OB RN states that OB wants her to go to [facility] via [facility] for observation over night.

## 2013-10-24 NOTE — H&P (Signed)
Attestation of Attending Supervision of Fellow: Evaluation and management procedures were performed by the Fellow under my supervision and collaboration.  I have reviewed the Fellow's note and chart, and I agree with the management and plan.    

## 2013-10-24 NOTE — ED Notes (Signed)
Mora Bellman MD at Antenatal room 157.

## 2013-10-24 NOTE — ED Notes (Signed)
OB RR at bedside applying monitor to patient. EDP cleared c-spine and removed C-collar.

## 2013-10-25 DIAGNOSIS — O99891 Other specified diseases and conditions complicating pregnancy: Secondary | ICD-10-CM

## 2013-10-25 DIAGNOSIS — O368190 Decreased fetal movements, unspecified trimester, not applicable or unspecified: Secondary | ICD-10-CM

## 2013-10-25 DIAGNOSIS — O4700 False labor before 37 completed weeks of gestation, unspecified trimester: Principal | ICD-10-CM

## 2013-10-25 DIAGNOSIS — R109 Unspecified abdominal pain: Secondary | ICD-10-CM

## 2013-10-25 DIAGNOSIS — M549 Dorsalgia, unspecified: Secondary | ICD-10-CM

## 2013-10-25 DIAGNOSIS — M542 Cervicalgia: Secondary | ICD-10-CM

## 2013-10-25 DIAGNOSIS — M25519 Pain in unspecified shoulder: Secondary | ICD-10-CM

## 2013-10-25 DIAGNOSIS — O9989 Other specified diseases and conditions complicating pregnancy, childbirth and the puerperium: Secondary | ICD-10-CM

## 2013-10-25 MED ORDER — CYCLOBENZAPRINE HCL 5 MG PO TABS: 5.0000 mg | ORAL_TABLET | Freq: Three times a day (TID) | ORAL | Status: DC | PRN

## 2013-10-25 NOTE — Discharge Instructions (Signed)
Lesiones en el embarazo (Injuries In Pregnancy) La causa ms comn de lesin y [employer] en mujeres embarazadas es el traumatismo. La causa ms comn de muerte del feto es la lesin y la muerte de la mujer [place].  Las cadas leves y los accidentes automovilsticos menores por lo general no daan el feto. El feto est protegido en el tero por un saco lleno de lquido. El feto puede daarse si ocurre un traumatismo directo en el abdomen o la pelvis. El traumatismo directo en el tero y la placenta puede afectar el suministro de sangre del feto. Los traumatismos ms graves que produzcan shock y sangrado importante tambin pueden comprometer y Ship broker en peligro el suministro de sangre del feto. Es importante que conozca su grupo sanguneo y el del padre en caso de que ocurra un sangrado vaginal. Si usted es RH negativa y ha sufrido un traumatismo grave o presenta hemorragia vaginal, necesitar recibir un medicamento (RhoGAM [Rh inmuno globulina]) para evitar problemas de Rh en embarazos futuros. LAS CAUSAS MS FRECUENTES DE LESIN SON:  Las cadas son ms comunes en el segundo y Chartered certified accountant trimestre del Media planner. Entre los factores que aumentan el riesgo de sufrir cadas se incluyen:  Aumento de [place].  El cambio del centro de gravedad.  Tropezarse con objetos que no puede ver.  Accidentes automovilsticos. Es importante llevar cinturn de seguridad y Forensic psychologist de [place] segura.  Violencia o agresin domstica Comunquese con el servicio de Multimedia programmer de su localidad (911 en los [place]). El abuso conyugal puede ser una causa importante de traumatismos durante el [place].  Quemaduras (por fuego o por electricidad). Evite encender fuego, levantar recipientes pesados que contengan lquidos hirvientes o calientes, o la reparacin de problemas elctricos. Entre las causas ms comunes de muerte de la mujer embarazada se incluyen:  Lesiones que causen Pensions consultant graves, shock y prdida del flujo  sanguneo hacia los rganos ms importantes.  Lesiones en la cabeza o el cuello que causan lesiones cerebrales o espinales graves.  El traumatismo torcico puede causar una lesin directa en el corazn y los pulmones o cualquier lesin que afecte la zona delimitada por las costillas (trax). Un traumatismo en esta zona puede resultar en un paro cardiorespiratorio. Los sntomas y el tratamiento dependern del tipo de lesin. INSTRUCCIONES PARA EL CUIDADO DOMICILIARIO   Comunquese con el profesional que la asiste si sufre un accidente automovilstico, aunque crea que ni usted ni el beb han sufrido lesiones. Es posible que el profesional quiera realizar una evaluacin por precaucin.  No tome aspirina. Esto puede empeorar el sangrado.  Puede aplicar compresas fras de 3 a 4 veces por da sobre la lesin si el profesional lo permite.  Luego de 24 horas aplique compresas calientes en la zona de la lesin si el profesional lo permite.  Comunquese con el profesional si el dolor aumenta en cualquier parte del cuerpo y no se alivia con las indicaciones de cuidado domiciliario.  En caso de lesiones graves, procure estar acompaada por alguien para que la ayude hasta que pueda cuidarse por s misma.  No utilice zapatos de taco alto mientras est embarazada.  Retire las alfombras o los objetos sueltos que se encuentren en el suelo. [place] DE INMEDIATO SI:   Ha sufrido una agresin (domstica o no).  Ha sufrido un accidente automovilstico.  Presenta sangrado vaginal.  Margette Fast prdida de lquido de la vagina.  Presenta contracciones uterinas (calambres plvicos o dolor).  Presenta dolor o rigidez en el cuello.  Se siente dbil, se desmaya o presenta vmitos persistentes luego de un traumatismo.  Ha sufrido una quemadura grave. Esto incluye quemaduras en la cara, el cuello, las manos o los genitales, o quemaduras mayores que el tamao de la palma de la mano en  Therapist, music.  Tiene dolor de cabeza o problemas en la visin luego de una cada o por cualquier otro traumatismo.  No siente los movimientos del beb, o el beb no se mueve tanto como antes. Document Released: 09/04/2005 Document Revised: 11/27/2011 [facility] Patient Information 2014 [place], [place].
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
## 2013-10-25 NOTE — Progress Notes (Signed)
Discharged home. Interpereter was used to provide education to patient with both patient and significant other verbalizing an understanding.

## 2013-11-14 ENCOUNTER — Ambulatory Visit (INDEPENDENT_AMBULATORY_CARE_PROVIDER_SITE_OTHER): Payer: Self-pay | Admitting: Obstetrics and Gynecology

## 2013-11-14 ENCOUNTER — Encounter: Payer: Self-pay | Admitting: Obstetrics and Gynecology

## 2013-11-14 VITALS — BP 110/60 | Wt 161.0 lb

## 2013-11-14 DIAGNOSIS — Z331 Pregnant state, incidental: Secondary | ICD-10-CM

## 2013-11-14 DIAGNOSIS — O34219 Maternal care for unspecified type scar from previous cesarean delivery: Secondary | ICD-10-CM

## 2013-11-14 DIAGNOSIS — Z3490 Encounter for supervision of normal pregnancy, unspecified, unspecified trimester: Secondary | ICD-10-CM

## 2013-11-14 DIAGNOSIS — Z3480 Encounter for supervision of other normal pregnancy, unspecified trimester: Secondary | ICD-10-CM

## 2013-11-14 DIAGNOSIS — O99019 Anemia complicating pregnancy, unspecified trimester: Secondary | ICD-10-CM

## 2013-11-14 DIAGNOSIS — Z1389 Encounter for screening for other disorder: Secondary | ICD-10-CM

## 2013-11-14 LAB — POCT URINALYSIS DIPSTICK
GLUCOSE UA: NEGATIVE
KETONES UA: NEGATIVE
Leukocytes, UA: NEGATIVE
Nitrite, UA: NEGATIVE
Protein, UA: 1
RBC UA: NEGATIVE

## 2013-11-14 LAB — OB RESULTS CONSOLE GC/CHLAMYDIA
Chlamydia: NEGATIVE
Gonorrhea: NEGATIVE

## 2013-11-14 MED ORDER — CYCLOBENZAPRINE HCL 5 MG PO TABS: 5.0000 mg | ORAL_TABLET | Freq: Three times a day (TID) | ORAL | Status: DC | PRN

## 2013-11-14 NOTE — Patient Instructions (Signed)
Evaluación de los movimientos fetales   (Fetal Movement Counts)  Nombre del paciente: __________________________________________________ Fecha de parto estimada: ____________________  La evaluación de los movimientos fetales es muy recomendable en los embarazos de alto riesgo, pero también es una buena idea que lo hagan todas las embarazadas. El médico le indicará que comience a contarlos a las 28 semanas de embarazo. Los movimientos fetales suelen aumentar:   · Después de una comida completa.  · Después de la actividad física.  · Después de comer o beber algo dulce o frío.  · En reposo.  Preste atención cuando sienta que el bebé está más activo. Esto le ayudará a notar un patrón de ciclos de vigilia y sueño de su bebé y cuáles son los factores que contribuyen a un aumento de los movimientos fetales. Es importante llevar a cabo un recuento de movimientos fetales, al mismo tiempo cada día, cuando el bebé normalmente está más activo.   CÓMO CONTAR LOS MOVIMIENTOS FETALES  1. Busque un lugar tranquilo y cómodo para sentarse o recostarse sobre el lado izquierdo. Al recostarse sobre su lado izquierdo, le proporciona una mejor circulación de sangre y oxígeno al bebé.  2. Anote el día y la hora en una hoja de papel o en un diario.  3. Comience contando las pataditas, revoloteos, chasquidos, vueltas o pinchazos en un período de 2 horas. Debe sentir al menos 10 movimientos en 2 horas.  4. Si no siente 10 movimientos en 2 horas, espere 2 ó 3 horas y cuente de nuevo. Busque cambios en el patrón o si no cuenta lo suficiente en 2 horas.  SOLICITE ATENCIÓN MÉDICA SI:   · Siente menos de 10 pataditas en 2 horas, en dos intentos.  · No hay movimientos durante una hora.  · El patrón se modifica o le lleva más tiempo cada día contar las 10 pataditas.  · Siente que el bebé no se mueve como lo hace habitualmente.  Fecha: ____________ Movimientos: ____________ Hora de inicio: ____________ Hora de finalización: ____________   Fecha:  ____________ Movimientos: ____________ Hora de inicio: ____________ Hora de finalización: ____________   Fecha: ____________ Movimientos: ____________ Hora de inicio: ____________ Hora de finalización: ____________   Fecha: ____________ Movimientos: ____________ Hora de inicio: ____________ Hora de finalización: ____________   Fecha: ____________ Movimientos: ____________ Hora de inicio: ____________ Hora de finalización: ____________   Fecha: ____________ Movimientos: ____________ Hora de inicio: ____________ Hora de finalización: ____________   Fecha: ____________ Movimientos: ____________ Hora de inicio: ____________ Hora de finalización: ____________   Fecha: ____________ Movimientos: ____________ Hora de inicio: ____________ Hora de finalización: ____________   Fecha: ____________ Movimientos: ____________ Hora de inicio: ____________ Hora de finalización: ____________   Fecha: ____________ Movimientos: ____________ Hora de inicio: ____________ Hora de finalización: ____________   Fecha: ____________ Movimientos: ____________ Hora de inicio: ____________ Hora de finalización: ____________   Fecha: ____________ Movimientos: ____________ Hora de inicio: ____________ Hora de finalización: ____________   Fecha: ____________ Movimientos: ____________ Hora de inicio: ____________ Hora de finalización: ____________   Fecha: ____________ Movimientos: ____________ Hora de inicio: ____________ Hora de finalización: ____________   Fecha: ____________ Movimientos: ____________ Hora de inicio: ____________ Hora de finalización: ____________   Fecha: ____________ Movimientos: ____________ Hora de inicio: ____________ Hora de finalización: ____________   Fecha: ____________ Movimientos: ____________ Hora de inicio: ____________ Hora de finalización: ____________   Fecha: ____________ Movimientos: ____________ Hora de inicio: ____________ Hora de finalización: ____________   Fecha: ____________ Movimientos: ____________ Hora  de inicio: ____________ Hora de finalización: ____________     finalizacin: ____________  Toma Copier: ____________ Movimientos: ____________ Eda Paschal inicio: ____________ Eda Paschal finalizacin: ____________  Toma Copier: ____________ Movimientos: ____________ Eda Paschal inicio: ____________ Eda Paschal finalizacin: ____________  Toma Copier: ____________ Movimientos: ____________ Eda Paschal inicio: ____________ Eda Paschal finalizacin: ____________  Toma Copier: ____________ Movimientos: ____________ Eda Paschal inicio: ____________ Eda Paschal finalizacin: ____________  Toma Copier: ____________ Movimientos: ____________ Eda Paschal inicio: ____________ Eda Paschal finalizacin: ____________  Toma Copier: ____________ Movimientos: ____________ Eda Paschal inicio: ____________ Mellody Drown de finalizacin: ____________  Toma Copier: ____________ Movimientos: ____________ Mellody Drown de inicio: ____________ Mellody Drown de finalizacin: ____________  Toma Copier: ____________ Movimientos: ____________ Mellody Drown de inicio: ____________ Mellody Drown de finalizacin: ____________  Toma Copier: ____________ Movimientos: ____________ Mellody Drown de inicio: ____________ Mellody Drown de finalizacin: ____________  Toma Copier: ____________ Movimientos: ____________ Mellody Drown de inicio: ____________ Mellody Drown de finalizacin: ____________  Toma Copier: ____________ Movimientos: ____________ Mellody Drown de inicio: ____________ Mellody Drown de finalizacin: ____________  Toma Copier: ____________ Movimientos: ____________ Mellody Drown de inicio: ____________ Mellody Drown de finalizacin: ____________  Toma Copier: ____________ Movimientos: ____________ Mellody Drown de inicio: ____________ Mellody Drown de finalizacin: ____________  Toma Copier: ____________ Movimientos: ____________ Mellody Drown de inicio: ____________ Mellody Drown de finalizacin:  ____________  Toma Copier: ____________ Movimientos: ____________ Mellody Drown de inicio: ____________ Mellody Drown de finalizacin: ____________  Toma Copier: ____________ Movimientos: ____________ Mellody Drown de inicio: ____________ Mellody Drown de finalizacin: ____________  Toma Copier: ____________ Movimientos: ____________ Mellody Drown de inicio: ____________ Mellody Drown de finalizacin: ____________  Toma Copier: ____________ Movimientos: ____________ Mellody Drown de inicio: ____________ Mellody Drown de finalizacin: ____________  Toma Copier: ____________ Movimientos: ____________ Mellody Drown de inicio: ____________ Mellody Drown de finalizacin: ____________  Toma Copier: ____________ Movimientos: ____________ Mellody Drown de inicio: ____________ Mellody Drown de finalizacin: ____________  Toma Copier: ____________ Movimientos: ____________ Mellody Drown de inicio: ____________ Mellody Drown de finalizacin: ____________  Toma Copier: ____________ Movimientos: ____________ Mellody Drown de inicio: ____________ Mellody Drown de finalizacin: ____________  Toma Copier: ____________ Movimientos: ____________ Mellody Drown de inicio: ____________ Mellody Drown de finalizacin: ____________  Toma Copier: ____________ Movimientos: ____________ Mellody Drown de inicio: ____________ Mellody Drown de finalizacin: ____________  Toma Copier: ____________ Movimientos: ____________ Mellody Drown de inicio: ____________ Mellody Drown de finalizacin: ____________  Toma Copier: ____________ Movimientos: ____________ Mellody Drown de inicio: ____________ Mellody Drown de finalizacin: ____________  Toma Copier: ____________ Movimientos: ____________ Mellody Drown de inicio: ____________ Mellody Drown de finalizacin: ____________  Toma Copier: ____________ Movimientos: ____________ Mellody Drown de inicio: ____________ Mellody Drown de finalizacin: ____________  Toma Copier: ____________ Movimientos: ____________ Mellody Drown de inicio: ____________ Mellody Drown de finalizacin: ____________  Toma Copier: ____________ Movimientos: ____________ Mellody Drown de inicio: ____________ Mellody Drown de finalizacin: ____________  Toma Copier: ____________ Movimientos: ____________ Mellody Drown de inicio: ____________ Mellody Drown de finalizacin: ____________  Toma Copier: ____________  Movimientos: ____________ Mellody Drown de inicio: ____________ Mellody Drown de finalizacin: ____________  Toma Copier: ____________ Movimientos: ____________ Mellody Drown de inicio: ____________ Mellody Drown de finalizacin: ____________  Toma Copier: ____________ Movimientos: ____________ Mellody Drown de inicio: ____________ Mellody Drown de finalizacin: ____________  Document Released: 12/12/2007 Document Revised: 08/21/2012 [employer] Patient Information 2014 [employer] [place]
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
## 2013-11-14 NOTE — Addendum Note (Signed)
Addended by: Farley Ly on: 11/14/2013 01:03 PM   Modules accepted: Orders
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
## 2013-11-15 LAB — GC/CHLAMYDIA PROBE AMP
CT Probe RNA: NEGATIVE
GC PROBE AMP APTIMA: NEGATIVE

## 2013-11-16 LAB — STREP B DNA PROBE: STREP GROUP B AG: NEGATIVE

## 2013-11-17 ENCOUNTER — Encounter: Payer: Self-pay | Admitting: Obstetrics and Gynecology

## 2013-11-28 ENCOUNTER — Inpatient Hospital Stay (HOSPITAL_COMMUNITY)
Admission: AD | Admit: 2013-11-28 | Discharge: 2013-11-28 | Disposition: A | Discharge status: Home / Self Care | Payer: Medicaid Other | Source: Ambulatory Visit | Attending: Family Medicine | Admitting: Family Medicine

## 2013-11-28 ENCOUNTER — Encounter: Payer: Self-pay | Admitting: Obstetrics and Gynecology

## 2013-11-28 ENCOUNTER — Ambulatory Visit (INDEPENDENT_AMBULATORY_CARE_PROVIDER_SITE_OTHER): Payer: Self-pay | Admitting: Obstetrics and Gynecology

## 2013-11-28 ENCOUNTER — Encounter (HOSPITAL_COMMUNITY): Payer: Self-pay | Admitting: *Deleted

## 2013-11-28 VITALS — BP 120/80 | Wt 164.0 lb

## 2013-11-28 DIAGNOSIS — R0989 Other specified symptoms and signs involving the circulatory and respiratory systems: Secondary | ICD-10-CM

## 2013-11-28 DIAGNOSIS — Z331 Pregnant state, incidental: Secondary | ICD-10-CM | POA: Insufficient documentation

## 2013-11-28 DIAGNOSIS — R030 Elevated blood-pressure reading, without diagnosis of hypertension: Secondary | ICD-10-CM | POA: Insufficient documentation

## 2013-11-28 DIAGNOSIS — O99119 Other diseases of the blood and blood-forming organs and certain disorders involving the immune mechanism complicating pregnancy, unspecified trimester: Secondary | ICD-10-CM

## 2013-11-28 DIAGNOSIS — O99019 Anemia complicating pregnancy, unspecified trimester: Secondary | ICD-10-CM

## 2013-11-28 DIAGNOSIS — O34219 Maternal care for unspecified type scar from previous cesarean delivery: Secondary | ICD-10-CM

## 2013-11-28 DIAGNOSIS — D689 Coagulation defect, unspecified: Secondary | ICD-10-CM | POA: Insufficient documentation

## 2013-11-28 DIAGNOSIS — O479 False labor, unspecified: Secondary | ICD-10-CM

## 2013-11-28 DIAGNOSIS — L299 Pruritus, unspecified: Secondary | ICD-10-CM

## 2013-11-28 DIAGNOSIS — D696 Thrombocytopenia, unspecified: Secondary | ICD-10-CM | POA: Insufficient documentation

## 2013-11-28 DIAGNOSIS — Z1389 Encounter for screening for other disorder: Secondary | ICD-10-CM

## 2013-11-28 LAB — CBC
HCT: 36 % (ref 36.0–46.0)
Hemoglobin: 12.2 g/dL (ref 12.0–15.0)
MCH: 29 pg (ref 26.0–34.0)
MCHC: 33.9 g/dL (ref 30.0–36.0)
MCV: 85.5 fL (ref 78.0–100.0)
PLATELETS: 116 10*3/uL — AB (ref 150–400)
RBC: 4.21 MIL/uL (ref 3.87–5.11)
RDW: 15.4 % (ref 11.5–15.5)
WBC: 6.9 10*3/uL (ref 4.0–10.5)

## 2013-11-28 LAB — COMPREHENSIVE METABOLIC PANEL
ALT: 20 U/L (ref 0–35)
AST: 28 U/L (ref 0–37)
Albumin: 2.5 g/dL — ABNORMAL LOW (ref 3.5–5.2)
Alkaline Phosphatase: 267 U/L — ABNORMAL HIGH (ref 39–117)
BUN: 10 mg/dL (ref 6–23)
CALCIUM: 9 mg/dL (ref 8.4–10.5)
CO2: 23 mEq/L (ref 19–32)
Chloride: 106 mEq/L (ref 96–112)
Creatinine, Ser: 0.79 mg/dL (ref 0.50–1.10)
GFR calc non Af Amer: >90 mL/min (ref >90)
Glucose, Bld: 102 mg/dL — ABNORMAL HIGH (ref 70–99)
Potassium: 4.3 mEq/L (ref 3.7–5.3)
SODIUM: 141 meq/L (ref 137–147)
Total Bilirubin: 0.2 mg/dL — ABNORMAL LOW (ref 0.3–1.2)
Total Protein: 6 g/dL (ref 6.0–8.3)

## 2013-11-28 LAB — POCT URINALYSIS DIPSTICK
Blood, UA: NEGATIVE
GLUCOSE UA: NEGATIVE
Ketones, UA: NEGATIVE
Leukocytes, UA: NEGATIVE
Nitrite, UA: NEGATIVE
Protein, UA: NEGATIVE

## 2013-11-28 LAB — PROTEIN / CREATININE RATIO, URINE
CREATININE, URINE: 146.77 mg/dL
PROTEIN CREATININE RATIO: 0.11 (ref 0.00–0.15)
TOTAL PROTEIN, URINE: 16.1 mg/dL

## 2013-11-28 MED ORDER — ZOLPIDEM TARTRATE 10 MG PO TABS: 10.0000 mg | ORAL_TABLET | Freq: Every evening | ORAL | Status: DC | PRN

## 2013-11-28 MED ORDER — PREDNISONE 10 MG PO TABS: 20.0000 mg | ORAL_TABLET | Freq: Every day | ORAL | Status: DC

## 2013-11-28 NOTE — MAU Provider Note (Signed)
History     CSN: [id number]  Arrival date and time: 11/28/13 1434   None     Chief Complaint  Patient presents with  . Labor Eval   HPI This is a 33 y.o. female at [redacted]w[redacted]d who presented for labor eval. Denies leaking or bleeding and reports + FM.   OB History   Grav Para Term Preterm Abortions TAB SAB Ect Mult Living   2 1 1       1       Past Medical History  Diagnosis Date  . Headache(784.0)   . Abnormal Pap smear   . History of worms     3 yrs ago    Past Surgical History  Procedure Laterality Date  . Cesarean section    . Colposcopy      History reviewed. No pertinent family history.  History  Substance Use Topics  . Smoking status: Never Smoker   . Smokeless tobacco: Never Used  . Alcohol Use: No    Allergies: No Known Allergies  Prescriptions prior to admission  Medication Sig Dispense Refill  . prenatal vitamin w/FE, FA (PRENATAL 1 + 1) 27-1 MG TABS tablet Take 1 tablet by mouth daily at 12 noon.  30 each  2  . cyclobenzaprine (FLEXERIL) 5 MG tablet Take 1 tablet (5 mg total) by mouth 3 (three) times daily as needed for muscle spasms.  30 tablet  0  . predniSONE (DELTASONE) 10 MG tablet Take 2 tablets (20 mg total) by mouth daily with breakfast.  14 tablet  0  . promethazine (PHENERGAN) 25 MG tablet Take 25 mg by mouth every 6 (six) hours as needed for nausea or vomiting.      Marland Kitchen zolpidem (AMBIEN) 10 MG tablet Take 1 tablet (10 mg total) by mouth at bedtime as needed for sleep.  15 tablet  0    Review of Systems  Constitutional: Negative for fever, chills and malaise/fatigue.  Eyes: Negative for double vision.  Gastrointestinal: Positive for abdominal pain. Negative for nausea, vomiting, diarrhea and constipation.  Neurological: Negative for dizziness, focal weakness and headaches.   Physical Exam   Blood pressure 122/76, pulse 70, temperature 97.8 F (36.6 C), temperature source Oral, resp. rate 18, last menstrual period 02/25/2013, SpO2  99.00%.  Physical Exam  Constitutional: She is oriented to person, place, and time. She appears well-developed and well-nourished.  HENT:  Head: Normocephalic.  Cardiovascular: Normal rate.   Respiratory: Effort normal.  GI: Soft. There is no tenderness. There is no rebound and no guarding.  Genitourinary: Vagina normal and uterus normal. No vaginal discharge found.  Dilation: Closed Effacement (%): 50 Cervical Position: Middle Station: Ballotable Exam by:: Noel Gerold RN   Musculoskeletal: Normal range of motion. She exhibits no edema.  Neurological: She is alert and oriented to person, place, and time. She has normal reflexes.  Skin: Skin is warm and dry.  Psychiatric: She has a normal mood and affect.   FHR reactive UCs earlier were irregular, every 3-4 min then spaced out, now occasional  BP noted to be elevated, so PIH labs drawn.  Filed Vitals:   11/28/13 1600 11/28/13 1615 11/28/13 1625 11/28/13 1637  BP: 132/83 120/72 139/74 122/76  Pulse: 61 67 59 70  Temp:      TempSrc:      Resp:      SpO2:        MAU Course  Procedures  MDM Results for orders placed during the hospital encounter of 11/28/13 (  from the past 24 hour(s))  COMPREHENSIVE METABOLIC PANEL     Status: Abnormal   Collection Time    11/28/13  3:50 PM      Result Value Ref Range   Sodium 141  137 - 147 mEq/L   Potassium 4.3  3.7 - 5.3 mEq/L   Chloride 106  96 - 112 mEq/L   CO2 23  19 - 32 mEq/L   Glucose, Bld 102 (*) 70 - 99 mg/dL   BUN 10  6 - 23 mg/dL   Creatinine, Ser 0.79  0.50 - 1.10 mg/dL   Calcium 9.0  8.4 - 10.5 mg/dL   Total Protein 6.0  6.0 - 8.3 g/dL   Albumin 2.5 (*) 3.5 - 5.2 g/dL   AST 28  0 - 37 U/L   ALT 20  0 - 35 U/L   Alkaline Phosphatase 267 (*) 39 - 117 U/L   Total Bilirubin 0.2 (*) 0.3 - 1.2 mg/dL   GFR calc non Af Amer >90  >90 mL/min   GFR calc Af Amer >90  >90 mL/min  CBC     Status: Abnormal   Collection Time    11/28/13  3:50 PM      Result Value Ref Range    WBC 6.9  4.0 - 10.5 K/uL   RBC 4.21  3.87 - 5.11 MIL/uL   Hemoglobin 12.2  12.0 - 15.0 g/dL   HCT 36.0  36.0 - 46.0 %   MCV 85.5  78.0 - 100.0 fL   MCH 29.0  26.0 - 34.0 pg   MCHC 33.9  30.0 - 36.0 g/dL   RDW 15.4  11.5 - 15.5 %   Platelets 116 (*) 150 - 400 K/uL  PROTEIN / CREATININE RATIO, URINE     Status: None   Collection Time    11/28/13  4:15 PM      Result Value Ref Range   Creatinine, Urine 146.77     Total Protein, Urine 16.1     PROTEIN CREATININE RATIO 0.11  0.00 - 0.15     Assessment and Plan  A:  SIUP at [redacted]w[redacted]d        Prodromal contractions, not in labor       Labile BPs, no evidence of PIH/preeclampsia       Thrombocytopenia, mild  P:  Discharge home       Labor and PIH precautions.        Will notify Dr Glo Herring of platelets  [facility] 11/28/2013, 5:24 PM

## 2013-11-28 NOTE — Patient Instructions (Addendum)
Por favor regrese a la oficina el lunes para [employer] de la prueba de [place]. Cita ser a las [place] para la picazn.

## 2013-11-28 NOTE — Progress Notes (Signed)
[redacted]w[redacted]d. G2P1. Good FM. VBAC. +skin irritation and itching to right forearm and abdomen. Has been using OTC antihistamine for the itching. 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 [id number].  1 wk of mutiple punctated spots, on arms and legs>body. Intensely pruritic. Causing insomnia Exam-small papules to arms and abdomen no vesicles.,  P: check Bile salts(nonfasting)    Rx benadryl    Rx Ambien 10 x 2wk  This chart was scribed by Jenne Campus, Medical Scribe, for Dr. Mallory Shirk on 11/28/13 at 11:12 AM. This chart was reviewed by Dr. Mallory Shirk and is accurate.

## 2013-11-28 NOTE — Discharge Instructions (Signed)
Parto vaginal (Vaginal Delivery) Durante el parto, el mdico la ayudar a dar a luz a su beb. En el parto vaginal, deber pujar para que el beb salga por la vagina. Sin embargo, antes de que pueda sacar al beb, es necesario que ocurran ciertas cosas. La abertura del tero (cuello del tero) tiene que ablandarse, hacerse ms delgado y abrirse (dilatar) hasta que llegue a 10 cm. Adems, el beb tiene que bajar desde el tero a la vagina.  SIGNOS DE TRABAJO DE PARTO  El mdico tendr primero que asegurarse de que usted est en trabajo de parto. Algunos signos son:   Eliminar lo que se llama tapn mucoso antes del inicio del trabajo de parto. Este es una pequea cantidad de mucosidad teida con sangre.  Tener contracciones uterinas regulares y dolorosas.  El tiempo entre las contracciones debe acortarse.  Las molestias y el dolor se harn ms intensos gradualmente.  El dolor de las contracciones empeora al caminar y no se alivia con el reposo.  El cuello del tero se hace mas delgado (se borra) y se dilata. ANTES DEL PARTO Una vez que se inicie el trabajo de parto y sea admitida en el hospital o sanatorio, el mdico podr hacer lo siguiente:   Realizar un examen fsico.  Controlar si hay complicaciones relacionadas con el trabajo de parto.  Verificar su presin arterial, temperatura y pulso y la frecuencia cardaca (signos vitales).  Determinar si se ha roto el saco amnitico y cundo ha ocurrido.  Realizar un examen vaginal (utilizando un guante estril y un lubricante) para determinar:  La posicin (presentacin) del beb. El beb se presenta con la cabeza primero (vertex) en el canal de parto (vagina), o estn los pies o las nalgas primero (de nalgas)?  El nivel (estacin) de la cabeza del beb dentro del canal de parto.  El borramiento y la dilatacin del cuello uterino.  El monitor fetal electrnico generalmente se coloca sobre el abdomen al llegar. Se utiliza para controlar  las contracciones y la frecuencia cardaca del beb.  Cuando el monitor est en el abdomen (monitor fetal externo), slo toma la frecuencia y la duracin de las contracciones. No informa acerca de la intensidad de las contracciones.  Si el mdico necesita saber exactamente la intensidad de las contracciones o cul es la frecuencia cardaca del beb, colocar un monitor interno en la vagina y el tero. El mdico comentar los riesgos y los beneficios de usar un monitor interno y le pedir autorizacin antes de colocar el dispositivo.  El monitoreo fetal continuo ser necesario si le han aplicado una epidural, si le administran ciertos medicamentos (como oxitocina) y si tiene complicaciones del embarazo o del trabajo de parto.  Podrn colocarle una va intravenosa en una vena del brazo para suministrarle lquidos y medicamentos, si es necesario. TRES ETAPAS DEL TRABAJO DE PARTO Y EL PARTO El trabajo de parto y el parto normales se dividen en tres etapas. Primera etapa Esta etapa comienza cuando comienzan las contracciones regulares y el cuello comienza a borrarse y dilatarse. Finaliza cuando el cuello est completamente abierto (completamente dilatado). La primera etapa es la etapa ms larga del trabajo de parto y puede durar desde 3 horas a 15 horas.  Algunos mtodos estn disponibles para ayudar con el dolor del parto. Usted y su mdico decidirn qu opcin es la mejor para usted. Las opciones incluyen:   Medicamentos narcticos. Estos son medicamentos fuertes que usted puede recibir a travs de una va intravenosa o como   inyeccin en el msculo. Estos medicamentos alivian el dolor pero no hacen que desaparezca completamente.  Epidural. Se administra un medicamento a travs de un tubo delgado que se inserta en la espalda. El medicamento adormece la parte inferior del cuerpo y evita el dolor en esa zona.  Bloqueo paracervical Es una inyeccin de un anestsico en cada lado del cuello  uterino.  Usted podr pedir un parto natural, que implica que no se usen analgsicos ni epidural durante el parto y el trabajo de parto. En cambio, podr tener otro tipo de ayuda como ejercicios respiratorios para hacer frente al dolor. Segunda etapa La segunda etapa del trabajo de parto comienza cuando el cuello se ha dilatado completamente a 10 cm. Contina hasta que usted puja al beb hacia abajo, por el canal de parto, y el beb nace. Esta etapa puede durar slo algunos minutos o algunas horas.  La posicin del la cabeza del beb a medida que pasa por el canal de parto, es informada como un nmero, llamado estacin. Si la cabeza del beb no ha iniciado su descenso, la estacin se describe como que est en menos 3 ( 3). Cuando la cabeza del beb est en la estacin cero, est en el medio del canal de parto y se encaja en la pelvis. La estacin en la que se encuentra el beb indica el progreso de la segunda etapa del trabajo de parto.  Cuando el beb nace, el mdico lo sostendr con la cabeza hacia abajo para evitar que el lquido amnitico, el moco y la sangre entren en los pulmones del beb. La boca y la nariz del beb podrn ser succionadas con un pequeo bulbo para retirar todo lquido adicional.  El mdico podr colocar al beb sobre su estmago. Es importante evitar que el beb tome fro. Para hacerlo, el mdico secar al beb, lo colocar directamente sobre su piel, (sin mantas entre usted y el beb) y lo cubrir con mantas secas y tibias.  Se corta el cordn umbilical. Tercera etapa Durante la tercera etapa del trabajo de parto, el mdico sacar la placenta (alumbramiento) y se asegurar de que el sangrado est controlado. La salida de la placenta generalmente demora 5 minutos pero puede tardar hasta 30 minutos. Luego de la salida de la placenta, le darn un medicamento por va intravenosa o inyectable para ayudar a contraer el tero y controlar el sangrado. Si planea amamantar al beb, puede  intentar en este momento. Luego de la salida de la placenta, el tero debe contraerse y quedar muy firme. Si el tero no queda firme, el mdico lo masajear. Esto es importante debido a que la contraccin del tero ayuda a cortar el sangrado en el sitio en que la placenta estaba unida al tero. Si el tero no se contrae adecuadamente ni permanece firme, podr causar un sangrado abundante. Si hay mucho sangrado, podrn darle medicamentos para contraer el tero y detener el sangrado.  Document Released: 08/17/2008 Document Revised: 05/07/2013 [employer] Patient Information 2014 [employer].  

## 2013-11-28 NOTE — MAU Provider Note (Signed)
Attestation of Attending Supervision of Advanced Practitioner (PA/CNM/NP): Evaluation and management procedures were performed by the Advanced Practitioner under my supervision and collaboration.  I have reviewed the Advanced Practitioner's note and chart, and I agree with the management and plan.  Donnamae Jude, MD [facility] Attending 11/28/2013 11:50 PM

## 2013-11-28 NOTE — MAU Note (Signed)
Patient presents to MAU with c/o frequent contractions 3 mins apart. Denies LOF or VB at this time. Reports good fetal movement.

## 2013-11-28 NOTE — Progress Notes (Signed)
Pt states that she is having trouble sleeping and the benadryl hasn't helped.

## 2013-12-01 ENCOUNTER — Encounter: Payer: Self-pay | Admitting: Obstetrics and Gynecology

## 2013-12-01 ENCOUNTER — Ambulatory Visit (INDEPENDENT_AMBULATORY_CARE_PROVIDER_SITE_OTHER): Payer: Self-pay | Admitting: Obstetrics and Gynecology

## 2013-12-01 VITALS — BP 120/78 | Wt 163.2 lb

## 2013-12-01 DIAGNOSIS — O34219 Maternal care for unspecified type scar from previous cesarean delivery: Secondary | ICD-10-CM

## 2013-12-01 DIAGNOSIS — O479 False labor, unspecified: Secondary | ICD-10-CM

## 2013-12-01 DIAGNOSIS — Z1389 Encounter for screening for other disorder: Secondary | ICD-10-CM

## 2013-12-01 DIAGNOSIS — O9989 Other specified diseases and conditions complicating pregnancy, childbirth and the puerperium: Secondary | ICD-10-CM

## 2013-12-01 DIAGNOSIS — Z331 Pregnant state, incidental: Secondary | ICD-10-CM

## 2013-12-01 DIAGNOSIS — O99019 Anemia complicating pregnancy, unspecified trimester: Secondary | ICD-10-CM

## 2013-12-01 DIAGNOSIS — Z3480 Encounter for supervision of other normal pregnancy, unspecified trimester: Secondary | ICD-10-CM

## 2013-12-01 LAB — POCT URINALYSIS DIPSTICK
Blood, UA: NEGATIVE
GLUCOSE UA: NEGATIVE
Ketones, UA: NEGATIVE
Leukocytes, UA: NEGATIVE
NITRITE UA: NEGATIVE

## 2013-12-01 NOTE — Progress Notes (Signed)
[redacted]w[redacted]d female for prenatal visit. Pt is here because she is beginning to have contractions. She states they last up to 3 minutes at a time and occur every 15 minutes. She denies vaginal bleeding or discharge.

## 2013-12-02 LAB — BILE ACIDS, TOTAL: BILE ACIDS TOTAL: 15 umol/L (ref 0–19)

## 2013-12-04 ENCOUNTER — Telehealth: Payer: Self-pay | Admitting: *Deleted

## 2013-12-04 NOTE — Progress Notes (Signed)
NST was ordered on 12/01/13 which was reactive, reviewed by jvf

## 2013-12-04 NOTE — Telephone Encounter (Signed)
Message copied by Farley Ly on Thu Dec 04, 2013  8:46 AM ------      Message from: Jonnie Kind      Created: Thu Dec 04, 2013  1:38 AM      Regarding: VBAC Consent       Chrystal., Caryl Pina      We MUST get pt in for CONSENT, Please check record, as I cannot find the VBAC form. Was seen Monday. Please have Clarise Cruz Call pt for f/u ------

## 2013-12-05 ENCOUNTER — Ambulatory Visit (INDEPENDENT_AMBULATORY_CARE_PROVIDER_SITE_OTHER): Payer: Self-pay | Admitting: Obstetrics and Gynecology

## 2013-12-05 ENCOUNTER — Encounter: Payer: Self-pay | Admitting: Obstetrics and Gynecology

## 2013-12-05 VITALS — BP 120/76 | Wt 162.0 lb

## 2013-12-05 DIAGNOSIS — Z1389 Encounter for screening for other disorder: Secondary | ICD-10-CM

## 2013-12-05 DIAGNOSIS — Z331 Pregnant state, incidental: Secondary | ICD-10-CM

## 2013-12-05 DIAGNOSIS — Z3490 Encounter for supervision of normal pregnancy, unspecified, unspecified trimester: Secondary | ICD-10-CM

## 2013-12-05 DIAGNOSIS — O99019 Anemia complicating pregnancy, unspecified trimester: Secondary | ICD-10-CM

## 2013-12-05 DIAGNOSIS — O34219 Maternal care for unspecified type scar from previous cesarean delivery: Secondary | ICD-10-CM

## 2013-12-05 NOTE — Progress Notes (Signed)
[redacted]w[redacted]d. G2P1. Good FM. No complaints. Chaperone present for exam which was performed with pt's permission.   This chart was scribed by Jenne Campus, Medical Scribe, for Dr. Mallory Shirk on 12/05/13 at 11:10 AM. This chart was reviewed by Dr. Mallory Shirk and is accurate.  Pt having continued normal FM, notes irregular contractions 3-4 x/ hour, no bleeding or srom.   PMH/PSH: pt reports with prior pregnancy she did NOT labor, received u/s and was sent for cesarean without laboring.  Plan: reexamin 5 days with NST/afi.           IOL at [facility] if undelivered.

## 2013-12-05 NOTE — Patient Instructions (Addendum)
Vamos a dejar que las mujeres van un semana pasada su fecha de vencimiento antes de programar la induccin del [place]. Esto no es peligroso para el beb o la madre. Una prueba para comprobar la actividad fetal se har la prxima semana para asegurarse de que todo est bien.

## 2013-12-05 NOTE — Progress Notes (Signed)
Pt denies any problems or concerns at this time.  

## 2013-12-08 ENCOUNTER — Encounter (HOSPITAL_COMMUNITY): Payer: Self-pay | Admitting: *Deleted

## 2013-12-08 ENCOUNTER — Inpatient Hospital Stay (HOSPITAL_COMMUNITY)
Admission: AD | Admit: 2013-12-08 | Discharge: 2013-12-10 | DRG: 775 | Disposition: A | Discharge status: Home / Self Care | Payer: Medicaid Other | Source: Ambulatory Visit | Attending: Obstetrics & Gynecology | Admitting: Obstetrics & Gynecology

## 2013-12-08 ENCOUNTER — Inpatient Hospital Stay (HOSPITAL_COMMUNITY): Payer: Medicaid Other | Admitting: Anesthesiology

## 2013-12-08 ENCOUNTER — Encounter (HOSPITAL_COMMUNITY): Payer: Medicaid Other | Admitting: Anesthesiology

## 2013-12-08 DIAGNOSIS — O9912 Other diseases of the blood and blood-forming organs and certain disorders involving the immune mechanism complicating childbirth: Secondary | ICD-10-CM

## 2013-12-08 DIAGNOSIS — IMO0001 Reserved for inherently not codable concepts without codable children: Secondary | ICD-10-CM

## 2013-12-08 DIAGNOSIS — D696 Thrombocytopenia, unspecified: Secondary | ICD-10-CM | POA: Diagnosis present

## 2013-12-08 DIAGNOSIS — D689 Coagulation defect, unspecified: Secondary | ICD-10-CM | POA: Diagnosis present

## 2013-12-08 DIAGNOSIS — O34219 Maternal care for unspecified type scar from previous cesarean delivery: Secondary | ICD-10-CM | POA: Diagnosis not present

## 2013-12-08 LAB — CBC
HCT: 38.8 % (ref 36.0–46.0)
HEMATOCRIT: 38.8 % (ref 36.0–46.0)
HEMOGLOBIN: 13 g/dL (ref 12.0–15.0)
Hemoglobin: 12.8 g/dL (ref 12.0–15.0)
MCH: 28.5 pg (ref 26.0–34.0)
MCH: 28.8 pg (ref 26.0–34.0)
MCHC: 33 g/dL (ref 30.0–36.0)
MCHC: 33.5 g/dL (ref 30.0–36.0)
MCV: 86.4 fL (ref 78.0–100.0)
MCV: 86 fL (ref 78.0–100.0)
PLATELETS: 114 10*3/uL — AB (ref 150–400)
Platelets: 111 10*3/uL — ABNORMAL LOW (ref 150–400)
RBC: 4.49 MIL/uL (ref 3.87–5.11)
RBC: 4.51 MIL/uL (ref 3.87–5.11)
RDW: 16 % — AB (ref 11.5–15.5)
RDW: 16.1 % — ABNORMAL HIGH (ref 11.5–15.5)
WBC: 8.9 10*3/uL (ref 4.0–10.5)
WBC: 11.1 10*3/uL — AB (ref 4.0–10.5)

## 2013-12-08 LAB — TYPE AND SCREEN
ABO/RH(D): O POS
ANTIBODY SCREEN: NEGATIVE

## 2013-12-08 LAB — POCT FERN TEST: POCT FERN TEST: NEGATIVE

## 2013-12-08 LAB — RPR: RPR: NONREACTIVE

## 2013-12-08 MED ORDER — LIDOCAINE HCL (PF) 1 % IJ SOLN
30.0000 mL | INTRAMUSCULAR | Status: DC | PRN
  Filled 2013-12-08: qty 30

## 2013-12-08 MED ORDER — ONDANSETRON HCL 4 MG/2ML IJ SOLN: 4.0000 mg | Freq: Four times a day (QID) | INTRAMUSCULAR | Status: DC | PRN

## 2013-12-08 MED ORDER — EPHEDRINE 5 MG/ML INJ
10.0000 mg | INTRAVENOUS | Status: DC | PRN
  Filled 2013-12-08: qty 2

## 2013-12-08 MED ORDER — ACETAMINOPHEN 325 MG PO TABS: 650.0000 mg | ORAL_TABLET | ORAL | Status: DC | PRN

## 2013-12-08 MED ORDER — OXYCODONE-ACETAMINOPHEN 5-325 MG PO TABS: 1.0000 | ORAL_TABLET | ORAL | Status: DC | PRN

## 2013-12-08 MED ORDER — LIDOCAINE HCL (PF) 1 % IJ SOLN
INTRAMUSCULAR | Status: DC | PRN
  Administered 2013-12-08 (×3): 4 mL

## 2013-12-08 MED ORDER — DIPHENHYDRAMINE HCL 50 MG/ML IJ SOLN: 12.5000 mg | INTRAMUSCULAR | Status: DC | PRN

## 2013-12-08 MED ORDER — OXYTOCIN 40 UNITS IN LACTATED RINGERS INFUSION - SIMPLE MED
1.0000 m[IU]/min | INTRAVENOUS | Status: DC
  Administered 2013-12-09: 2 m[IU]/min via INTRAVENOUS
  Filled 2013-12-08: qty 1000

## 2013-12-08 MED ORDER — FENTANYL CITRATE 0.05 MG/ML IJ SOLN
100.0000 ug | INTRAMUSCULAR | Status: DC | PRN
  Administered 2013-12-08: 100 ug via INTRAVENOUS
  Filled 2013-12-08: qty 2

## 2013-12-08 MED ORDER — LACTATED RINGERS IV SOLN: 500.0000 mL | Freq: Once | INTRAVENOUS | Status: DC

## 2013-12-08 MED ORDER — PHENYLEPHRINE 40 MCG/ML (10ML) SYRINGE FOR IV PUSH (FOR BLOOD PRESSURE SUPPORT)
80.0000 ug | PREFILLED_SYRINGE | INTRAVENOUS | Status: DC | PRN
  Filled 2013-12-08: qty 2

## 2013-12-08 MED ORDER — PHENYLEPHRINE 40 MCG/ML (10ML) SYRINGE FOR IV PUSH (FOR BLOOD PRESSURE SUPPORT)
80.0000 ug | PREFILLED_SYRINGE | INTRAVENOUS | Status: DC | PRN
  Filled 2013-12-08: qty 2
  Filled 2013-12-08: qty 10

## 2013-12-08 MED ORDER — EPHEDRINE 5 MG/ML INJ
10.0000 mg | INTRAVENOUS | Status: DC | PRN
  Filled 2013-12-08: qty 4
  Filled 2013-12-08: qty 2

## 2013-12-08 MED ORDER — OXYTOCIN BOLUS FROM INFUSION: 500.0000 mL | INTRAVENOUS | Status: DC

## 2013-12-08 MED ORDER — TERBUTALINE SULFATE 1 MG/ML IJ SOLN: 0.2500 mg | Freq: Once | INTRAMUSCULAR | Status: AC | PRN

## 2013-12-08 MED ORDER — FENTANYL 2.5 MCG/ML BUPIVACAINE 1/10 % EPIDURAL INFUSION (WH - ANES)
14.0000 mL/h | INTRAMUSCULAR | Status: DC | PRN
Start: 2013-12-08 — End: 2013-12-09
  Administered 2013-12-08: 12 mL/h via EPIDURAL
  Administered 2013-12-09: 14 mL/h via EPIDURAL
  Filled 2013-12-08 (×2): qty 125

## 2013-12-08 MED ORDER — LACTATED RINGERS IV SOLN
500.0000 mL | INTRAVENOUS | Status: DC | PRN
  Administered 2013-12-08: 1000 mL via INTRAVENOUS

## 2013-12-08 MED ORDER — OXYTOCIN 40 UNITS IN LACTATED RINGERS INFUSION - SIMPLE MED
62.5000 mL/h | INTRAVENOUS | Status: DC
  Administered 2013-12-09: 62.5 mL/h via INTRAVENOUS

## 2013-12-08 MED ORDER — CITRIC ACID-SODIUM CITRATE 334-500 MG/5ML PO SOLN: 30.0000 mL | ORAL | Status: DC | PRN

## 2013-12-08 MED ORDER — LACTATED RINGERS IV SOLN
INTRAVENOUS | Status: DC
  Administered 2013-12-08 – 2013-12-09 (×3): via INTRAVENOUS

## 2013-12-08 MED ORDER — IBUPROFEN 600 MG PO TABS
600.0000 mg | ORAL_TABLET | Freq: Four times a day (QID) | ORAL | Status: DC | PRN
  Administered 2013-12-09: 600 mg via ORAL
  Filled 2013-12-08: qty 1

## 2013-12-08 NOTE — Progress Notes (Signed)
Spanish interpreter called to assess pain, pt given options and would like IV pain medication

## 2013-12-08 NOTE — Progress Notes (Signed)
Dr Berkley Harvey in discussing admission plan of care. Eda helping. Apple juice to pt.

## 2013-12-08 NOTE — H&P (Signed)
Alison Mendoza is a 33 y.o. female G2P1001 with IUP at [redacted]w[redacted]d presenting for contractions. Pt states she has been having regular, every 3-5 minutes contractions, associated with less flow than a normal period vaginal bleeding.  Membranes are intact, with active fetal movement.    [facility]are at [facility] since 9 wks  Prenatal History/Complications: Hx of Csection in [place] w/ vertical incision & unknown uterine incision  Past Medical History: Past Medical History  Diagnosis Date  . Headache(784.0)   . Abnormal Pap smear   . History of worms     3 yrs ago    Past Surgical History: Past Surgical History  Procedure Laterality Date  . Cesarean section    . Colposcopy      Obstetrical History: OB History   Grav Para Term Preterm Abortions TAB SAB Ect Mult Living   2 1 1       1      Social History: History   Social History  . Marital Status: Significant Other    Spouse Name: N/A    Number of Children: N/A  . Years of Education: N/A   Social History Main Topics  . Smoking status: Never Smoker   . Smokeless tobacco: Never Used  . Alcohol Use: No  . Drug Use: No  . Sexual Activity: Not Currently    Birth Control/ Protection: None   Other Topics Concern  . None   Social History Narrative  . None    Family History: Family History  Problem Relation Age of Onset  . Diabetes Sister     Allergies: No Known Allergies  Prescriptions prior to admission  Medication Sig Dispense Refill  . Prenatal Vit-Fe Fumarate-FA (PRENATAL MULTIVITAMIN) TABS tablet Take 1 tablet by mouth daily at 12 noon.      Marland Kitchen zolpidem (AMBIEN) 10 MG tablet Take 1 tablet (10 mg total) by mouth at bedtime as needed for sleep.  15 tablet  0    Review of Systems: Negative unless otherwise stated in History above  Physicial Last menstrual period 02/25/2013. General appearance: alert, cooperative and no distress Lungs: clear to auscultation bilaterally Heart: regular rate and rhythm Abdomen: soft,  non-tender; bowel sounds normal Extremities: Homans sign is negative, no sign of DVT Presentation: cephalic Fetal monitoringBaseline: 140 bpm, Variability: Good {> 6 bpm), Accelerations: Reactive and Decelerations: Absent Uterine activityFrequency: Every 2-5 minutes Dilation: Closed Effacement (%): 50 Station: -2 Exam by:: Blima Singer RNC  Prenatal labs: ABO, Rh: --/--/O POS, O POS (02/06 1840) Antibody: NEG (02/06 1840) Rubella:   RPR: NON REAC (01/02 0908)  HBsAg: NEGATIVE (08/18 1721)  HIV: NON REACTIVE (01/02 0908)  GBS: NEGATIVE (02/27 1314)  GTT: Declined   Prenatal Transfer Tool  Maternal Diabetes: No Genetic Screening: Normal Maternal Ultrasounds/Referrals: Normal Fetal Ultrasounds or other Referrals:  None Maternal Substance Abuse:  No Significant Maternal Medications:  None Significant Maternal Lab Results: Lab values include: Group B Strep negative  Results for orders placed during the hospital encounter of 12/08/13 (from the past 24 hour(s))  POCT FERN TEST   Collection Time    12/08/13 11:32 AM      Result Value Ref Range   POCT Fern Test Negative = intact amniotic membranes      Assessment: Alison Mendoza is a 33 y.o. G2P1001 at [redacted]w[redacted]d by here for active labor; Desires TOLAC w/ Hx of Csection in [place] w/ vertical incision & unknown uterine incision. Declined GTT  #Labor: TOLAC - Will observe for cervical change given frequency of  ctx and unknown uterine incision; May augment with Foley bulb if does not progress.  #Pain: Labor support w/o medications #FWB: Cat 1 #ID:  GBS negative #Feeding: Breast - successful with 1st baby #MOC:Vasectomy  #Circ:  NA - Female  Phill Myron MD [facility] PGY-1 12/08/2013, 1:51 PM  I have seen and examined this patient and agree with above documentation in the resident's note.   Ebbie Latus, M.D. [facility] Fellow 12/08/2013 5:27 PM

## 2013-12-08 NOTE — MAU Note (Signed)
Contractions since midnight. Less movement this am compared to last night. Past blood clots few times.

## 2013-12-08 NOTE — Anesthesia Preprocedure Evaluation (Signed)
Anesthesia Evaluation  Patient identified by MRN, date of birth, ID band Patient awake    Reviewed: Allergy & Precautions, H&P , NPO status , Patient's Chart, lab work & pertinent test results, reviewed documented beta blocker date and time   History of Anesthesia Complications Negative for: history of anesthetic complications  Airway Mallampati: III TM Distance: >3 FB Neck ROM: full    Dental no notable dental hx. (+) Teeth Intact   Pulmonary neg pulmonary ROS,  breath sounds clear to auscultation  Pulmonary exam normal       Cardiovascular negative cardio ROS  Rhythm:regular Rate:Normal     Neuro/Psych negative neurological ROS  negative psych ROS   GI/Hepatic negative GI ROS, Neg liver ROS,   Endo/Other  negative endocrine ROS  Renal/GU negative Renal ROS  negative genitourinary   Musculoskeletal   Abdominal Normal abdominal exam  (+)   Peds  Hematology Thrombocytopenia - plt 114   Anesthesia Other Findings   Reproductive/Obstetrics (+) Pregnancy (h/o C/ Sx1 in [place] (unknown scar), attempting VBAC)                           Anesthesia Physical Anesthesia Plan  ASA: II  Anesthesia Plan: Epidural   Post-op Pain Management:    Induction:   Airway Management Planned:   Additional Equipment:   Intra-op Plan:   Post-operative Plan:   Informed Consent: I have reviewed the patients History and Physical, chart, labs and discussed the procedure including the risks, benefits and alternatives for the proposed anesthesia with the patient or authorized representative who has indicated his/her understanding and acceptance.     Plan Discussed with:   Anesthesia Plan Comments:         Anesthesia Quick Evaluation

## 2013-12-08 NOTE — Progress Notes (Signed)
Eda present for sve. To L lateral after exam

## 2013-12-08 NOTE — Progress Notes (Signed)
Dr Berkley Harvey notified of pt's admission. Aware of c/o bleeding with no blood seen now, ctx pattern, prior c/s but desires vbac, decreased FM this am but reactive now. RN to check cervix and obtain fern

## 2013-12-08 NOTE — Progress Notes (Signed)
Alison Mendoza is a 33 y.o. G2P1001 at [redacted]w[redacted]d  admitted for early labor.   Subjective:  Pt progressing very slowly and contracting fairly regularly. Having pain but tolerable. +FM.    Objective: BP 118/64  Pulse 70  Temp(Src) 97.7 F (36.5 C) (Oral)  Resp 20  Ht 4\' 11"  (1.499 m)  Wt 73.483 kg (162 lb)  BMI 32.70 kg/m2  LMP 02/25/2013      FHT:  FHR: 135 bpm, variability: moderate,  accelerations:  Present,  decelerations:  Absent UC:   irregular, every 2-5 minutes SVE:   Dilation: 1 Effacement (%): 40;50 Station: -3 Exam by:: Dr. Olevia Bowens  Labs: Lab Results  Component Value Date   WBC 8.9 12/08/2013   HGB 12.8 12/08/2013   HCT 38.8 12/08/2013   MCV 86.4 12/08/2013   PLT 111* 12/08/2013    Assessment / Plan: TOLAC, contractions and early labor  Labor: FB placed and inflated with 60cc of LR Fetal Wellbeing:  Category I Pain Control:  Labor support without medications I/D:  n/a Anticipated MOD:  NSVD  Meghana Tullo L 12/08/2013, 7:26 PM

## 2013-12-08 NOTE — Progress Notes (Signed)
Up to BR earlier. Back to bed on left side

## 2013-12-08 NOTE — Anesthesia Procedure Notes (Signed)
Epidural Patient location during procedure: OB Start time: 12/08/2013 9:51 PM  Staffing Performed by: anesthesiologist   Preanesthetic Checklist Completed: patient identified, site marked, surgical consent, pre-op evaluation, timeout performed, IV checked, risks and benefits discussed and monitors and equipment checked  Epidural Patient position: sitting Prep: site prepped and draped and DuraPrep Patient monitoring: continuous pulse ox and blood pressure Approach: midline Injection technique: LOR air  Needle:  Needle type: Tuohy  Needle gauge: 17 G Needle length: 9 cm and 9 Needle insertion depth: 6 cm Catheter type: closed end flexible Catheter size: 19 Gauge Catheter at skin depth: 11 cm Test dose: negative  Assessment Events: blood not aspirated, injection not painful, no injection resistance, negative IV test and no paresthesia  Additional Notes Discussed risk of headache, infection, bleeding, nerve injury and failed or incomplete block.  Patient voices understanding and wishes to proceed.  Epidural placed easily on first attempt.  Transient right leg paresthesia.  Patient tolerated procedure well with no apparent complications.  Charlton Haws, MDReason for block:procedure for pain

## 2013-12-08 NOTE — Progress Notes (Signed)
Dr Berkley Harvey notified of pt's cervical reck. Will see pt. Plans to admit pt

## 2013-12-09 ENCOUNTER — Encounter (HOSPITAL_COMMUNITY): Payer: Self-pay | Admitting: *Deleted

## 2013-12-09 DIAGNOSIS — O34219 Maternal care for unspecified type scar from previous cesarean delivery: Secondary | ICD-10-CM

## 2013-12-09 DIAGNOSIS — D689 Coagulation defect, unspecified: Secondary | ICD-10-CM | POA: Diagnosis not present

## 2013-12-09 DIAGNOSIS — O9912 Other diseases of the blood and blood-forming organs and certain disorders involving the immune mechanism complicating childbirth: Secondary | ICD-10-CM

## 2013-12-09 DIAGNOSIS — D696 Thrombocytopenia, unspecified: Secondary | ICD-10-CM | POA: Diagnosis not present

## 2013-12-09 LAB — CBC
HEMATOCRIT: 37.7 % (ref 36.0–46.0)
HEMOGLOBIN: 12.5 g/dL (ref 12.0–15.0)
MCH: 28.5 pg (ref 26.0–34.0)
MCHC: 33.2 g/dL (ref 30.0–36.0)
MCV: 86.1 fL (ref 78.0–100.0)
Platelets: 105 10*3/uL — ABNORMAL LOW (ref 150–400)
RBC: 4.38 MIL/uL (ref 3.87–5.11)
RDW: 16 % — ABNORMAL HIGH (ref 11.5–15.5)
WBC: 12.3 10*3/uL — AB (ref 4.0–10.5)

## 2013-12-09 MED ORDER — LANOLIN HYDROUS EX OINT: TOPICAL_OINTMENT | CUTANEOUS | Status: DC | PRN

## 2013-12-09 MED ORDER — SENNOSIDES-DOCUSATE SODIUM 8.6-50 MG PO TABS
2.0000 | ORAL_TABLET | ORAL | Status: DC
  Administered 2013-12-10: 2 via ORAL
  Filled 2013-12-09: qty 2

## 2013-12-09 MED ORDER — SIMETHICONE 80 MG PO CHEW: 80.0000 mg | CHEWABLE_TABLET | ORAL | Status: DC | PRN

## 2013-12-09 MED ORDER — IBUPROFEN 600 MG PO TABS
600.0000 mg | ORAL_TABLET | Freq: Four times a day (QID) | ORAL | Status: DC
  Administered 2013-12-09 – 2013-12-10 (×4): 600 mg via ORAL
  Filled 2013-12-09 (×4): qty 1

## 2013-12-09 MED ORDER — DIPHENHYDRAMINE HCL 25 MG PO CAPS: 25.0000 mg | ORAL_CAPSULE | Freq: Four times a day (QID) | ORAL | Status: DC | PRN

## 2013-12-09 MED ORDER — ZOLPIDEM TARTRATE 5 MG PO TABS: 5.0000 mg | ORAL_TABLET | Freq: Every evening | ORAL | Status: DC | PRN

## 2013-12-09 MED ORDER — ONDANSETRON HCL 4 MG/2ML IJ SOLN: 4.0000 mg | INTRAMUSCULAR | Status: DC | PRN

## 2013-12-09 MED ORDER — INFLUENZA VAC SPLIT QUAD 0.5 ML IM SUSP
0.5000 mL | INTRAMUSCULAR | Status: AC
  Administered 2013-12-10: 0.5 mL via INTRAMUSCULAR
  Filled 2013-12-09: qty 0.5

## 2013-12-09 MED ORDER — ONDANSETRON HCL 4 MG PO TABS
4.0000 mg | ORAL_TABLET | ORAL | Status: DC | PRN
Start: 2013-12-09 — End: 2013-12-10

## 2013-12-09 MED ORDER — BENZOCAINE-MENTHOL 20-0.5 % EX AERO
1.0000 "application " | INHALATION_SPRAY | CUTANEOUS | Status: DC | PRN
  Administered 2013-12-10: 1 via TOPICAL
  Filled 2013-12-09: qty 56

## 2013-12-09 MED ORDER — PRENATAL MULTIVITAMIN CH
1.0000 | ORAL_TABLET | Freq: Every day | ORAL | Status: DC
  Administered 2013-12-09 – 2013-12-10 (×2): 1 via ORAL
  Filled 2013-12-09 (×2): qty 1

## 2013-12-09 MED ORDER — OXYCODONE-ACETAMINOPHEN 5-325 MG PO TABS
1.0000 | ORAL_TABLET | ORAL | Status: DC | PRN
  Administered 2013-12-09 – 2013-12-10 (×3): 1 via ORAL
  Filled 2013-12-09 (×3): qty 1

## 2013-12-09 MED ORDER — TETANUS-DIPHTH-ACELL PERTUSSIS 5-2.5-18.5 LF-MCG/0.5 IM SUSP
0.5000 mL | Freq: Once | INTRAMUSCULAR | Status: AC
  Administered 2013-12-10: 0.5 mL via INTRAMUSCULAR
  Filled 2013-12-09: qty 0.5

## 2013-12-09 MED ORDER — DIBUCAINE 1 % RE OINT: 1.0000 "application " | TOPICAL_OINTMENT | RECTAL | Status: DC | PRN

## 2013-12-09 MED ORDER — WITCH HAZEL-GLYCERIN EX PADS: 1.0000 "application " | MEDICATED_PAD | CUTANEOUS | Status: DC | PRN

## 2013-12-09 NOTE — Anesthesia Postprocedure Evaluation (Signed)
  Anesthesia Post-op Note  Patient: Alison Mendoza  Procedure(s) Performed: * No procedures listed *  Patient Location: Mother/Baby  Anesthesia Type:Epidural  Level of Consciousness: awake  Airway and Oxygen Therapy: Patient Spontanous Breathing  Post-op Pain: mild  Post-op Assessment: Patient's Cardiovascular Status Stable and Respiratory Function Stable  Post-op Vital Signs: stable  Complications: No apparent anesthesia complications

## 2013-12-09 NOTE — Lactation Note (Addendum)
This note was copied from the chart of Alison Mendoza. Lactation Consultation Note Initial visit at 11 hours of age with spanish interpreter Alex.  Mom reports breast feeding is going well, but she didn't have a wet diaper yet so she gave her some formula.  Hand expression demonstrated with colostrum visible.  Explained to mom baby only needs colostrum and what to expect for feedings and output over the next week.  Mom plans to now just breast feed.  Mom attempts cradle latch sitting on side of the bed.  Assisted mom to sit back in bed and with cross cradle hold for latch.  MOm needed much assist with this hold, but baby latched well.  Baby has good rhythmic suckling. Mom is aware of feeding cues.  Encouraged mom to call as needed.   Patient Name: Alison Mendoza [id number] Date: 12/09/2013 Reason for consult: Initial assessment   Maternal Data Has patient been taught Hand Expression?: Yes Does the patient have breastfeeding experience prior to this delivery?: Yes  Feeding Feeding Type: Breast Fed Length of feed: 20 min  LATCH Score/Interventions Latch: Grasps breast easily, tongue down, lips flanged, rhythmical sucking.  Audible Swallowing: A few with stimulation  Type of Nipple: Everted at rest and after stimulation  Comfort (Breast/Nipple): Soft / non-tender     Hold (Positioning): Assistance needed to correctly position infant at breast and maintain latch. Intervention(s): Breastfeeding basics reviewed;Support Pillows;Position options;Skin to skin  LATCH Score: 8  Lactation Tools Discussed/Used     Consult Status Consult Status: Follow-up Date: 12/10/13 Follow-up type: In-patient    Murel Wigle, Justine Null 12/09/2013, 5:59 PM

## 2013-12-09 NOTE — Progress Notes (Signed)
Alison Mendoza is a 33 y.o. G2P1001 at [redacted]w[redacted]d  admitted for early labor.   Subjective: On pit contracting fairly regularly. Pain well controlled with epidural.    Objective: BP 130/72  Pulse 62  Temp(Src) 98 F (36.7 C) (Oral)  Resp 20  Ht 4\' 11"  (1.499 m)  Wt 73.483 kg (162 lb)  BMI 32.70 kg/m2  SpO2 83%  LMP 02/25/2013      FHT:  FHR: 135 bpm, variability: moderate,  accelerations:  Present,  decelerations:  Absent UC:   irregular, every 2-5 minutes SVE:   Dilation: 5 Effacement (%): 60 Station: -2 Exam by:: [email or web address],ML  Labs: Lab Results  Component Value Date   WBC 11.1* 12/08/2013   HGB 13.0 12/08/2013   HCT 38.8 12/08/2013   MCV 86.0 12/08/2013   PLT 114* 12/08/2013    Assessment / Plan: TOLAC, contractions and early labor  Labor: s/p FB; on pitocin.  Fetal Wellbeing:  Category I Pain Control:  Labor support without medications I/D:  n/a Anticipated MOD:  NSVD  Jacques Earthly 12/09/2013, 2:35 AM

## 2013-12-10 ENCOUNTER — Other Ambulatory Visit: Payer: Self-pay | Admitting: Obstetrics and Gynecology

## 2013-12-10 DIAGNOSIS — O34219 Maternal care for unspecified type scar from previous cesarean delivery: Secondary | ICD-10-CM | POA: Diagnosis not present

## 2013-12-10 LAB — CBC
HEMATOCRIT: 33.6 % — AB (ref 36.0–46.0)
HEMOGLOBIN: 10.8 g/dL — AB (ref 12.0–15.0)
MCH: 28.1 pg (ref 26.0–34.0)
MCHC: 32.1 g/dL (ref 30.0–36.0)
MCV: 87.3 fL (ref 78.0–100.0)
Platelets: 89 10*3/uL — ABNORMAL LOW (ref 150–400)
RBC: 3.85 MIL/uL — AB (ref 3.87–5.11)
RDW: 16.5 % — ABNORMAL HIGH (ref 11.5–15.5)
WBC: 11.4 10*3/uL — AB (ref 4.0–10.5)

## 2013-12-10 MED ORDER — GLYCERIN (LAXATIVE) 2.1 G RE SUPP
1.0000 | Freq: Once | RECTAL | Status: AC
  Administered 2013-12-10: 1 via RECTAL
  Filled 2013-12-10: qty 1

## 2013-12-10 MED ORDER — DOCUSATE SODIUM 100 MG PO CAPS: 100.0000 mg | ORAL_CAPSULE | Freq: Every day | ORAL | Status: DC

## 2013-12-10 MED ORDER — OXYCODONE-ACETAMINOPHEN 5-325 MG PO TABS: 1.0000 | ORAL_TABLET | ORAL | Status: DC | PRN

## 2013-12-10 NOTE — Progress Notes (Signed)
UR chart review completed.  

## 2013-12-10 NOTE — Discharge Summary (Signed)
Obstetric Discharge Summary Reason for Admission: onset of labor Prenatal Procedures: none Intrapartum Procedures: spontaneous vaginal delivery Postpartum Procedures: none Complications-Operative and Postpartum: 2nd degree perineal laceration Hemoglobin  Date Value Ref Range Status  12/10/2013 10.8* 12.0 - 15.0 g/dL Final     HCT  Date Value Ref Range Status  12/10/2013 33.6* 36.0 - 46.0 % Final    Discharge Diagnoses: Term Pregnancy-delivered  Hospital Course:  Alison Mendoza is a 33 y.o. [id number] who presented with active labor.  She had a uncomplicated SVD w/ 2nd degree laceration. She was able to ambulate, tolerate PO and void normally. She was discharged home with instructions for postpartum care.    Delivery Note  At 5:59 AM a viable female was delivered via VBAC, Spontaneous (Presentation: ; Occiput Anterior). APGAR: 9, 9; weight 8 lb 0.2 oz (3635 g).  Placenta status: Intact, Spontaneous. Cord: 3 vessels with the following complications: None. Cord pH: not sent.  Anesthesia: Epidural  Episiotomy: None  Lacerations: 2nd degree;Perineal  Suture Repair: 3.0 vicryl  Est. Blood Loss (mL): 250  Mom to postpartum. Baby to Couplet care / Skin to Skin.  [name] was admitted for TOLAC at term after she began having contractions associated with light bleeding. She proceeded to have a foley bulb placed and, after it was out, was augmented with pitocin and then progressed to crowning. She pushed for a short while, and after 6-7 pushes with good maternal effort, the head of a viable fetus presented, followed by anterior shoulder, and then the body. Crying baby placed on mother's chest. Cord clamped x2 and cut by FOB. Cord blood drawn for gas, but not sent as baby had vigorous cries. Placenta delivered intact with 3V cord. 2nd degree perineal tear repaired in the usual fashion by myself. Dr. Ebbie Latus supervised the delivery and repair. Hemostatic. Counts correct x2.  Alison Mendoza  12/09/2013, 6:39 AM  I was present for and supervised the delivery of this newborn. I agree with above documentation.  BECK, Alison L, Mendoza  Physical Exam:  General: alert, cooperative and no distress Lochia: appropriate Uterine Fundus: firm DVT Evaluation: No evidence of DVT seen on physical exam. Negative Homan's sign. No cords or calf tenderness. No significant calf/ankle edema.  Discharge Information: Date: 12/10/2013 Activity: pelvic rest Diet: routine Medications: PNV, Colace and Percocet Baby feeding: plans to breastfeed Contraception: vasectomy Condition: stable Instructions: refer to practice specific booklet Discharge to: home Follow-up Information   Follow up with [facility] In 4 weeks. (Postpartum visit)    Specialty:  Obstetrics and Gynecology   Contact information:   [place] [address] [phone]      Newborn Data: Live born female  Birth Weight: 8 lb 0.2 oz (3635 g) APGAR: 9, 9  Home with mother.  Alison Myron, Mendo[facility] FM PGY-1 12/10/2013, 7:47 AM  I was present for the exam and agree with above.  Alison Mendoza, [place] 12/10/2013 10:36 AM

## 2013-12-10 NOTE — Lactation Note (Signed)
This note was copied from the chart of Alison Mendoza. Lactation Consultation Note Follow up consult:  [place] Interpreter [phone] Baby 69 hours old and sleeping.  Mother states she has no breast milk yet.  Mother states she has only drops. Offered to review hand expression with mother but she states she knows how. Reviewed volume needs, cluster feeding and supply and demand with mother. She states she massages prior to breastfeeding and during.   Reviewed engorgement care, hand pump use and lactation support services. Encouraged mother to call if assistance is needed.   Patient Name: Alison Ixchel Duck NIOEV'O Date: 12/10/2013 Reason for consult: Follow-up assessment   Maternal Data    Feeding Feeding Type: Breast Fed Length of feed: 15 min  LATCH Score/Interventions Latch: Grasps breast easily, tongue down, lips flanged, rhythmical sucking.  Audible Swallowing: A few with stimulation  Type of Nipple: Everted at rest and after stimulation  Comfort (Breast/Nipple): Soft / non-tender     Hold (Positioning): No assistance needed to correctly position infant at breast.  LATCH Score: 9  Lactation Tools Discussed/Used     Consult Status Consult Status: PRN    Vivianne Master [facility] 12/10/2013, 10:21 AM

## 2013-12-10 NOTE — Discharge Instructions (Signed)
Parto vaginal, Cuidados posteriores  (Vaginal Delivery, Care After) Siga estas instrucciones durante las prximas semanas. Estas indicaciones para el alta le proporcionan informacin general acerca de cmo deber cuidarse despus del parto. El mdico tambin podr darle instrucciones especficas. El tratamiento ha sido planificado segn las prcticas mdicas actuales, pero en algunos casos pueden ocurrir problemas. Comunquese con el mdico si tiene algn problema o tiene preguntas al volver a su casa.  INSTRUCCIONES PARA EL CUIDADO EN EL HOGAR   Tome slo medicamentos de venta libre o recetados, segn las indicaciones del mdico o del Development worker, international aid.  No beba alcohol, especialmente si est amamantando o toma analgsicos.  No mastique tabaco ni fume.  No consuma drogas.  Contine con un adecuado cuidado perineal. El buen cuidado perineal incluye:  Higienizarse de adelante hacia atrs.  Mantener la zona perineal limpia.  No use tampones ni duchas vaginales hasta que su mdico la autorice.  Dchese, lvese el cabello y tome baos de inmersin segn las indicaciones de su mdico.  Utilice un sostn que le ajuste bien y que brinde buen soporte a sus Glass blower/designer.  Consuma alimentos saludables.  Beba suficiente lquido para Consulting civil engineer orina clara o de color amarillo plido.  Consuma alimentos ricos en fibra como cereales y panes integrales, arroz, frijoles y frutas y verduras frescas todos los [place]. Estos alimentos pueden ayudarla a prevenir o Cytogeneticist.  Siga las recomendaciones de su mdico relacionadas con la reanudacin de actividades como subir escaleras, conducir automviles, levantar objetos, hacer ejercicios o viajar.  Hable con su mdico acerca de reanudar la actividad sexual. Volver a la actividad sexual depende del riesgo de infeccin, la velocidad de la curacin y la comodidad y su deseo de Financial controller.  Trate de que alguien la ayude con las actividades del hogar y con  el recin nacido al menos durante un par de das despus de salir del hospital.  Descanse todo lo que pueda. Trate de descansar o tomar una siesta mientras el beb est durmiendo.  Aumente sus actividades gradualmente.  Cumpla con todas las visitas de control programadas para despus del parto. Es muy importante asistir a todas las Teacher, English as a foreign language de seguimiento. En estas citas, su mdico va a controlarla para asegurarse de que est sanando fsica y emocionalmente. SOLICITE ATENCIN MDICA SI:   Elimina cogulos grandes por la vagina. Guarde algunos cogulos para mostrarle al mdico.  Tiene una secrecin con feo olor que proviene de la vagina.  Tiene dificultad para orinar.  Orina con frecuencia.  Siente dolor al [employer].  Nota un cambio en sus movimientos intestinales.  Aumenta el enrojecimiento, el dolor o la hinchazn en la zona de la incisin vaginal (episiotoma) o el desgarro vaginal.  Tiene pus que drena por la episiotoma o el desgarro vaginal.  La episiotoma o el desgarro vaginal se abren.  Sus [employer]GM [employer]AGE duelen, estn duras o enrojecidas.  Sufre un dolor intenso de [place].  Tiene visin borrosa o ve manchas.  Se siente triste o deprimida.  Tiene pensamientos acerca de lastimarse o daar al recin nacido.  Tiene preguntas acerca de su cuidado personal, el cuidado del recin nacido o acerca de los medicamentos.  Se siente mareada o sufre un desmayo.  Tiene una erupcin.  Tiene nuseas o vmitos.  Usted amamant al beb y no ha tenido su perodo menstrual dentro de las 12 semanas despus de dejar de Economist.  No amamanta al beb y no tuvo su perodo menstrual en las ltimas 12 semanas despus del  partoLance Muss. SOLICITE ATENCIN MDICA DE INMEDIATO SI:   Siente dolor persistente.  Siente dolor en el pecho.  Le falta el aire.  Se desmaya.  Siente dolor en la pierna.  Siente Physiological scientist.  El sangrado vaginal satura dos o ms  apsitos en 1 hora. ASEGRESE DE QUE:   Comprende estas instrucciones.  Controlar su enfermedad.  Recibir ayuda de inmediato si no mejora o si empeora. Document Released: 09/04/2005 Document Revised: 05/07/2013 [facility] Patient Information 2014 [place], [place].  Lactancia materna (Breastfeeding) Decidir Museum/gallery exhibitions officer es una de las mejores elecciones que puede hacer por usted y su beb. El cambio hormonal durante el Psychiatrist produce el desarrollo del tejido mamario y [place] la cantidad y el tamao de los conductos galactforos. Estas hormonas tambin permiten que las protenas, los azcares y las grasas de la sangre produzcan la WPS Resources materna en las glndulas productoras de [place]. Las hormonas impiden que la leche materna sea liberada antes del nacimiento del beb, adems de impulsar el flujo de leche luego del nacimiento. Una vez que ha comenzado a Museum/gallery exhibitions officer, Conservation officer, nature beb, as Immunologist succin o Theatre manager, pueden estimular la liberacin de [place] de las glndulas productoras de [place].  LOS BENEFICIOS DE AMAMANTAR Para el beb  La primera leche (calostro) ayuda al mejor funcionamiento del sistema digestivo del beb.  La leche tiene anticuerpos que ayudan a Radio producer las infecciones en el beb.  El beb tiene una menor incidencia de asma, alergias y del sndrome de muerte sbita del lactante.  Los nutrientes en la [place] materna son mejores para el beb que la [place] maternizada y estn preparados exclusivamente para cubrir las necesidades del beb.  La leche materna mejora el desarrollo cerebral del beb.  Es menos probable que el beb desarrolle otras enfermedades, como obesidad infantil, asma o diabetes mellitus de tipo 2. Para usted   La lactancia materna favorece el desarrollo de un vnculo muy especial entre la madre y el beb.  Es conveniente. Siempre est disponible a la temperatura correcta y es [place].  La lactancia materna ayuda a quemar caloras y a perder el peso ganado  durante el [place].  Favorece la contraccin del tero al tamao que tena antes del embarazo de manera ms rpida y disminuye el sangrado (loquios) despus del parto.  La lactancia materna contribuye a reducir Nurse, adult de desarrollar diabetes mellitus de tipo 2, osteoporosis o cncer de mama o de ovario en el futuro. SIGNOS DE QUE EL BEB EST HAMBRIENTO Primeros signos de [address] de [place].  Se estira.  Mueve la cabeza de un lado a otro.  Mueve la cabeza y abre la boca cuando se le toca la mejilla o la comisura de la boca (reflejo de bsqueda).  Aumenta las vocalizaciones, tales como sonidos de succin, se relame los labios, emite arrullos, suspiros, o chirridos.  Mueve la [employer] boca.  Se chupa con ganas los dedos o las manos. Signos tardos de Fisher [employer].  Llora de manera intermitente. Signos de AES Corporation signos de hambre extrema requerirn que lo calme y lo consuele antes de que el beb pueda alimentarse adecuadamente. No espere a que se manifiesten los siguientes signos de hambre extrema para comenzar a Museum/gallery exhibitions officer:   Designer, jewellery.  Llanto intenso y fuerte.   Gritos. INFORMACIN BSICA SOBRE LA LACTANCIA MATERNA Iniciacin de la lactancia materna  Encuentre un lugar cmodo para sentarse o acostarse, con un buen  respaldo para el cuello y la espalda.  Coloque una almohada o una manta enrollada debajo del beb para acomodarlo a la altura de la mama (si est sentada). Las almohadas para Museum/gallery exhibitions officeramamantar se han diseado especialmente a fin de servir de apoyo para los brazos y el beb Smithfield Foodsmientras amamanta.  Asegrese de que el abdomen del beb est frente al suyo.  Masajee suavemente la mama. Con las yemas de los dedos, masajee la pared del pecho hacia el pezn en un movimiento circular. Esto estimula el flujo de Harveyleche. Es posible que Engineer, manufacturing systemsdeba continuar este movimiento mientras amamanta si la leche fluye lentamente.  Sostenga la mama  con el pulgar por arriba del pezn y los otros 4 dedos por debajo de la mama. Asegrese de que los dedos se encuentren lejos del pezn y de la boca del beb.  Empuje suavemente los labios del beb con el pezn o con el dedo.  Cuando la boca del beb se abra lo suficiente, acrquelo rpidamente a la mama e introduzca todo el pezn y la zona oscura que lo rodea (areola), tanto como sea posible, dentro de la boca del beb.  Debe haber ms areola visible por arriba del labio superior del beb que por debajo del labio inferior.  La lengua del beb debe estar entre la enca inferior y la Gruvermama.  Asegrese de que la boca del beb est en la posicin correcta alrededor del pezn (prendida). Los labios del beb deben crear un sello sobre la mama, doblndose hacia afuera (invertidos).  Es comn que el beb succione durante 2 a 3 minutos para que comience el flujo de [place] Cityleche materna. Cmo debe prenderse Es muy importante que le ensee al beb cmo prenderse adecuadamente a la mama. Si el beb no se prende adecuadamente, puede causarle dolor en el pezn y reducir la produccin de Hudsonleche materna, y hacer que el beb tenga un escaso aumento de Anchor Baypeso. Adems, si el beb no se prende adecuadamente al pezn, puede tragar aire durante la alimentacin. Esto puede causarle molestias al beb. Hacer eructar al beb al Pilar Platecambiar de mama puede ayudarlo a liberar el aire. Sin embargo, ensearle al beb cmo prenderse a la mama adecuadamente es la mejor manera de evitar que se sienta molesto por tragar Oceanographeraire mientras se alimenta. Signos de que el beb se ha prendido adecuadamente al pezn:   Payton Doughtyironea o succiona de modo silencioso, sin causarle dolor.  Se escucha que traga cada 3 o 4 succiones.   Hay movimientos musculares por arriba y por delante de sus odos al Printmakersuccionar. Signos de que el beb no se ha prendido Audiological scientistadecuadamente al pezn:   Hace ruidos de succin o de chasquido mientras se alimenta.  Dolor en el pezn. Si cree  que el beb no se prendi correctamente, deslice el dedo en la comisura de la boca y Ameren Corporationcolquelo entre las encas del beb para interrumpir la succin. Intente comenzar a amamantar nuevamente. Signos de Fish farm managerlactancia materna exitosa Signos del beb:   Disminucin gradual en el nmero de succiones o cese completo de la succin.  Se duerme.  Relaja el cuerpo.  Retiene una pequea cantidad de Pillagerleche en su boca.  Se desprende solo del pecho. Signos que presenta usted:  Las mamas han aumentado la firmeza, el peso y el tamao 1 a 3 horas despus de Museum/gallery exhibitions officeramamantar.  Estn ms blandas inmediatamente despus de amamantar.  Un aumento del volumen de Byronleche, y tambin el cambio de su consistencia y color se producen Psychiatric nursehacia el  [place].  Los pezones no duelen, ni estn agrietados ni sangran. Signos de que su beb recibe la cantidad de leche suficiente  Moja al menos 3 paales en 24 horas. La orina debe ser clara y de color amarillo plido a los [place].  Defeca al menos 3 veces en 24 horas a los 5 das de vida. La materia fecal debe ser blanda y Nikolai.  Defeca al menos 3 veces en 24 horas a los 7 das de vida. La materia fecal debe ser grumosa y [place].  No registra una prdida de peso mayor del 10% del peso al nacer durante los primeros [place].  Aumenta de peso un promedio de 4 a 7onzas (120 a 254ml) por semana despus de los [place].  Aumenta de [place], [place], de [place] consistente a Proofreader de los 5 das de vida, sin Museum/gallery curator prdida de peso despus de las 2 semanas de vida. Despus de alimentarse, es posible que el beb regurgite una pequea cantidad. Esto es frecuente. FRECUENCIA Y DURACIN DE LA LACTANCIA MATERNA El amamantamiento frecuente la ayudar a producir ms [place] y a Warden/ranger de Social research officer, government en los pezones e hinchazn en las [place]. Alimente al beb cuando muestre signos de hambre o si siente la necesidad de reducir la  congestin de las [place]. Esto se denomina "lactancia a demanda". Evite el uso del chupete mientras trabaja para establecer la lactancia (las primeras 4 a 6 semanas despus del nacimiento del beb). Despus de este perodo, podr ofrecerle un chupete. Las investigaciones demostraron que el uso del chupete durante el primer ao de vida del beb disminuye el riesgo de desarrollar el sndrome de muerte sbita del lactante (SMSL). Permita que el nio se alimente en cada mama todo lo que desee. Contine amamantando al beb hasta que haya terminado de alimentarse. Cuando el beb se desprende o se queda dormido mientras se est alimentando de la primera mama, ofrzcale la segunda. Debido a que, con frecuencia, los recin [place] las primeras semanas de vida, es posible que deba despertar a su beb para alimentarlo. Los horarios de Writer de un beb a otro. Sin embargo, las siguientes reglas pueden servir como gua para ayudarle a Engineer, materials que el beb se alimenta adecuadamente:  Se puede amamantar a los recin nacidos (bebs de 4 semanas o menos de vida) cada 1 a 3 horas.  No deben transcurrir ms de 3 horas durante el da o 5 horas durante la noche sin que se amamante a los recin nacidos.  Debe amamantar al beb 8 veces como mnimo, en un perodo de 24 horas, hasta que comience a introducir slidos en su dieta, a los 6 meses de vida aproximadamente. [place] extraccin y Recruitment consultant de la leche materna le permiten asegurarse de que el beb se alimente exclusivamente de [place], aun en momentos en los que no puede amamantar. Esto tiene especial importancia si debe regresar al [place] en el perodo en que an est amamantando o si no puede estar presente en los momentos en que el beb debe alimentarse. Su asesor en lactancia puede orientarla sobre cunto tiempo es [place].  El sacaleche es un aparato que le permite extraer  leche de la mama a un recipiente estril. Luego, la leche materna extrada puede almacenarse en un refrigerador o freezer. Algunos sacaleches son Theodore Demark, James Ivanoff otros son elctricos. Consulte a Personnel officer  qu tipo ser ms conveniente para usted. Los sacaleches se pueden comprar, sin embargo, algunos hospitales y grupos de apoyo a la lactancia materna alquilan Sports coachsacaleches mensualmente. Un asesor en lactancia puede ensearle cmo extraer W. R. Berkleyleche materna manualmente, en caso de que prefiera no usar un sacaleche.  CMO CUIDAR LAS MAMAS DURANTE LA LACTANCIA MATERNA Los pezones se secan, agrietan y duelen durante la Tour managerlactancia materna. Las siguientes recomendaciones pueden ayudarle a Pharmacologistmantener las TEPPCO Partnersmamas humectadas y sanas:  Careers information officervite usar jabn en los pezones.  Use un sostn de soporte. Aunque no son esenciales, las camisetas sin mangas o los sostenes especiales para Museum/gallery exhibitions officeramamantar estn diseados para acceder fcilmente a las mamas, para Museum/gallery exhibitions officeramamantar sin tener que quitarse todo el sostn o la camiseta. Evite usar sostenes con aro o sostenes muy ajustados.  Seque al aire sus pezones durante 3 a 4minutos despus de amamantar al beb.  Utilice solo apsitos de Haematologistalgodn en el sostn para Environmental health practitionerabsorber las prdidas de Arthurdaleleche. La prdida de un poco de Public Service Enterprise Groupleche materna entre las tomas es normal.  Utilice lanolina sobre los pezones luego de Museum/gallery exhibitions officeramamantar. La lanolina ayuda a mantener la humedad normal de la piel. Si Botswanausa lanolina pura, no tiene que lavarse los pezones antes de Corporate treasureralimentar al beb. La lanolina pura no es txica para el beb. Adems, puede extraer Beazer Homesmanualmente algunas gotas de Randallleche materna y Engineer, maintenance (IT)masajear suavemente esa Winn-Dixieleche sobre los pezones, para que la [place]-on-[place]yleche se seque al aire. Durante las primeras semanas despus de dar a luz, algunas mujeres pueden experimentar hinchazn en las mamas (congestin Royal Lakesmamaria). La congestin puede hacer que sienta las mamas pesadas, calientes y sensibles al tacto. El pico de la  congestin ocurre dentro de los 3 a 5 das despus del South Carthageparto. Las siguientes recomendaciones pueden ayudarle a Paramedicaliviar la congestin:  Vace por completo las mamas al QUALCOMMamamantar o Environmental health practitionerextraer leche. Puede aplicar calor hmedo en las mamas (en la ducha o con toallas hmedas para manos) antes de Museum/gallery exhibitions officeramamantar o extraer WPS Resourcesleche. Esto aumenta la circulacin y [place] and the Grenadinesayuda a que la [place] Clevelandleche fluya. Si el beb no vaca por completo las [address] D[address]amas cuando lo [address] Aveamamanta, extraiga la Meekerleche restante despus de que haya finalizado.  Use un sostn ajustado (para amamantar o comn) o camiseta sin mangas durante 1 o 2 das para indicar al cuerpo que disminuya ligeramente la produccin de [place].  Aplique compresas de hielo Yahoo! Incsobre las mamas, a menos que le resulte demasiado incmodo.  Asegrese de que el beb se encuentre en la posicin correcta mientras lo alimenta. Si la congestin persiste luego de 48 horas o despus de seguir estas recomendaciones, comunquese con su mdico o un Holiday representativeasesor en lactancia. RECOMENDACIONES GENERALES PARA EL CUIDADO DE LA SALUD DURANTE LA LACTANCIA MATERNA  Consuma alimentos saludables. Alterne comidas y colaciones, comiendo 3 de cada Agricultural engineeruna por da. Dado que lo que come Danaher Corporationafecta la leche materna, es posible que algunas comidas hagan que su beb se vuelva ms irritable de lo habitual. Evite comer este tipo de alimentos, si percibe que afectan de manera negativa al beb.  Beba leche, jugos de fruta y agua para Patent examinersatisfacer su sed (aproximadamente 10 vasos al Futures traderda).  Descanse con frecuencia, reljese y tome sus vitaminas prenatales para evitar la fatiga, el estrs y la anemia.  Contine con los autocontroles de la mama.  Evite masticar y fumar tabaco.  Evite el consumo de alcohol y drogas. Algunos medicamentos, que pueden ser perjudiciales para el beb, pueden pasar a travs de la Colgate Palmoliveleche materna. Es importante que consulte a  su mdico antes de Medical sales representative, incluidos todos los medicamentos recetados y de  [place], as como los suplementos vitamnicos y herbales. Puede quedar embarazada durante la lactancia. Si desea controlar la natalidad, consulte a su mdico cules son las opciones ms seguras para el beb. SOLICITE ATENCIN MDICA SI:   Usted siente que quiere dejar de Economist o se siente frustrada con la lactancia.  Siente dolor en las mamas o en los pezones.  Sus pezones estn agrietados o Control and instrumentation engineer.  Sus pechos estn irritados, sensibles o calientes.  Tiene un rea hinchada en cualquiera de las mamas.  Siente escalofros o fiebre.  Tiene nuseas o vmitos.  Presenta una secrecin de otro lquido distinto de la leche materna de los pezones.  Sus mamas no se llenan antes de Economist al beb para el 5. da despus del parto.  Se siente triste y deprimida.  El beb est demasiado somnoliento como para comer bien.  El beb tiene problemas para dormir.  Moja menos de 3 paales en 24 horas.  Defeca menos de 3 veces en 24 horas.  La piel del beb o la parte blanca de sus ojos est amarilla.  El beb no ha aumentado de [place] a los [place]. SOLICITE ATENCIN MDICA DE INMEDIATO SI:   El beb est muy cansado (aletargado) y no se despierta para comer.  Le sube la fiebre sin causa. Document Released: 09/04/2005 Document Revised: 12/30/2012 [facility] Patient Information 2014 [place], [place].

## 2013-12-11 ENCOUNTER — Ambulatory Visit: Payer: Self-pay

## 2013-12-11 NOTE — H&P (Signed)
Attestation of Attending Supervision of Fellow: Evaluation and management procedures were performed by the Fellow under my supervision and collaboration.  I have reviewed the Fellow's note and chart, and I agree with the management and plan.    

## 2013-12-11 NOTE — Lactation Note (Signed)
This note was copied from the chart of Alison Shawntavia Saunders. Lactation Consultation Note: Follow up visit with mom before DC. [place] interpreter assisted. Assisted mom with latch. Her breasts are feeling fuller this morning. Mom reports lots of pain with nursing. Reviewed wide open mouth and keeping the baby close to the breast throughout the feeding. She has been letting her slip to the nipple so the baby could breathe. Mom reports that feels better. Breast softened after nursing. Baby off to sleep. Reviewed engorgement prevention and treatment. Has manual pump for home use. No questions at present. Has comfort gels in room- placed on nipples and mom reports that feels good. Instructions on use and cleaning given.   Patient Name: Alison Mendoza IWLNL'[id number] Date: 12/11/2013 Reason for consult: Follow-up assessment   Maternal Data    Feeding Feeding Type: Breast Fed Length of feed: 15 min  LATCH Score/Interventions Latch: Grasps breast easily, tongue down, lips flanged, rhythmical sucking.  Audible Swallowing: A few with stimulation  Type of Nipple: Everted at rest and after stimulation  Comfort (Breast/Nipple): Filling, red/small blisters or bruises, mild/mod discomfort  Problem noted: Severe discomfort Interventions (Mild/moderate discomfort): Comfort gels  Hold (Positioning): Assistance needed to correctly position infant at breast and maintain latch. Intervention(s): Breastfeeding basics reviewed;Support Pillows;Position options;Skin to skin  LATCH Score: 7  Lactation Tools Discussed/Used     Consult Status Consult Status: Complete    Truddie Crumble 12/11/2013, 12:50 PM

## 2013-12-25 ENCOUNTER — Ambulatory Visit (INDEPENDENT_AMBULATORY_CARE_PROVIDER_SITE_OTHER): Payer: Self-pay | Admitting: Obstetrics and Gynecology

## 2013-12-25 ENCOUNTER — Encounter: Payer: Self-pay | Admitting: Obstetrics and Gynecology

## 2013-12-25 VITALS — BP 110/78 | Wt 142.2 lb

## 2013-12-25 DIAGNOSIS — N898 Other specified noninflammatory disorders of vagina: Secondary | ICD-10-CM

## 2013-12-25 DIAGNOSIS — Z130 Encounter for screening for diseases of the blood and blood-forming organs and certain disorders involving the immune mechanism: Secondary | ICD-10-CM

## 2013-12-25 DIAGNOSIS — N939 Abnormal uterine and vaginal bleeding, unspecified: Secondary | ICD-10-CM

## 2013-12-25 LAB — POCT URINALYSIS DIPSTICK
Blood, UA: 1
Glucose, UA: NEGATIVE
Ketones, UA: NEGATIVE
LEUKOCYTES UA: NEGATIVE
Nitrite, UA: NEGATIVE
Protein, UA: NEGATIVE

## 2013-12-25 LAB — POCT HEMOGLOBIN: Hemoglobin: 12.2 g/dL (ref 12.2–16.2)

## 2013-12-25 NOTE — Patient Instructions (Signed)
Constipacin - Adulto  (Constipation, Adult)  Constipacin significa que una persona tiene menos de 3 evacuaciones en una semana, hay dificultad para evacuar el intestino, o las heces son secas, duras, o ms grandes que lo normal. A medida que envejecemos el estreimiento es ms comn. Si intenta curar el estreimiento con medicamentos que producen la evacuacin intestinal (laxantes), el problema puede empeorar. El uso prolongado de laxantes puede hacer que los msculos del colon se debiliten. Una dieta baja en fibra, no tomar suficientes lquidos y el uso de ciertos medicamentos pueden Agricultural engineer.  CAUSAS   Ciertos medicamentos, como los antidepresivos, analgsicos, suplementos de hierro, anticidos y diurticos.   Algunas enfermedades, como la diabetes, el sndrome del colon irritable (SII), enfermedad de la tiroides, o depresin.   No beber suficiente agua.   No consumir suficientes alimentos ricos en fibra.   Situaciones de estrs o viajes.  Falta de actividad fsica o de ejercicio.  No ir al bao cuando siente la necesidad.  Ignorar la necesidad sbita de Film/video editor intestino.  Uso en exceso de laxantes. SNTOMAS   Evacuar el intestino menos de 3 veces a la semana.   Dificultad para mover el [place] y duras, o ms grandes que las normales.   Sensacin de estar lleno o distendido.   Dolor en la parte baja del abdomen  No se siente alivio despus de evacuar el intestino. DIAGNSTICO  El mdico le har una historia clnica y le har un examen fsico. Pueden hacerle exmenes adicionales para el estreimiento grave. Algunas pruebas son:   Un radiografa con enema de bario para examinar el recto, el colon y en algunos casos el intestino delgado.  Una sigmoidoscopia para examinar el colon inferior.  Una colonoscopia para examinar todo el colon. TRATAMIENTO  El tratamiento depender de la gravedad de la constipacin y de la  causa. Algunos tratamientos dietticos son beber ms lquidos y comer ms alimentos ricos en fibra. El cambio en el estilo de vida incluye hacer ejercicios de [place] regular. Si estas recomendaciones para Animator dieta y en el estilo de vida no ayudan, el mdico le puede indicar el uso de laxantes de venta libre para Industrial/product designer movimiento intestinal. Los medicamentos con Statistician se pueden prescribir si los medicamentos de venta libre no lo mejoran.  INSTRUCCIONES PARA EL CUIDADO EN EL HOGAR   Aumente el consumo de alimentos con [place], como frutas, verduras, granos enteros y frijoles. Limite los azcares ricos en grasas y procesados   en su dieta, tales como papas fritas, hamburguesas, galletas, dulces y refrescos.   Puede agregar un suplemento de fibra a su dieta si no obtiene lo suficiente de los alimentos.   Debe ingerir gran cantidad de lquido para mantener la orina de tono claro o color amarillo plido.   Haga ejercicios regularmente o segn las indicaciones de su mdico.   Vaya al bao cuando sienta la necesidad de ir. No espere.  Tome slo la medicacin que le indic el profesional.  No tome otros medicamentos para la constipacin sin Teacher, adult education a su mdico. SOLICITE ATENCIN MDICA DE INMEDIATO SI:   Observa sangre brillante en las heces.   La constipacin dura ms de 4 das o empeora.   Siente dolor abdominal o rectal.   Las heces son delgadas como un lpiz.  Pierde peso de [place] inexplicable. ASEGRESE DE QUE:   Comprende estas instrucciones.  Controlar su enfermedad.  Solicitar ayuda de inmediato si  no mejora o empeora. Document Released: 09/24/2007 Document Revised: 03/05/2012 [facility] Patient Information 2014 [place], [place].   Dulcolax -depositivo rapido Miralax - resultado in 2-3 dias

## 2013-12-25 NOTE — Progress Notes (Signed)
   [facility] Visit  Patient name: Alison Mendoza MRN [id number]  Date of birth: 1980/11/02  CC & HPI:  Alison Mendoza is a 33 y.o. female presenting today for vaginal bleeding post vaginal delivery. Pt has some concern re soreness, has stitches The VBAC was 12/10/13; she denies current abdominal pain. She has also experienced constipation.    ROS:  +Lochia + Constipation - Abdominal pain  HGB 12.2 No other complaints.   Pertinent History Reviewed:  Medical & Surgical Hx:  Reviewed: Significant for VBAC Medications: Reviewed & Updated - see associated section Social History: Reviewed -  reports that she has never smoked. She has never used smokeless tobacco.  Objective Findings:  Vitals: BP 110/78  Wt 142 lb 3.2 oz (64.501 kg)  Breastfeeding? Yes  Physical Examination: General appearance - alert, well appearing, and in no distress, oriented to person, place, and time, normal appearing weight and anxious Mental status - alert, oriented to person, place, and time, depressed mood Abdomen -  nontender, nondistended, no masses or organomegaly Pelvic - VULVA: normal appearing vulva with no masses, tenderness or lesions, VAGINA: normal appearing vagina with normal color and discharge, no lesions, ob laceration 2nd degree, reapproxomated , CERVIX: normal appearing cervix without discharge or lesions Pelvic exam performed with chaperon present. On exam, minimal lochia.    Assessment & Plan:   A:  1. Lochia WNL 2. Constipation   P: 1. Hemoglobin check 2. Use of Dulcolax, Miralax

## 2014-01-02 ENCOUNTER — Ambulatory Visit: Payer: Self-pay | Admitting: Obstetrics and Gynecology

## 2014-01-16 ENCOUNTER — Ambulatory Visit: Payer: Self-pay | Admitting: Obstetrics and Gynecology

## 2014-01-30 ENCOUNTER — Encounter: Payer: Self-pay | Admitting: Obstetrics and Gynecology

## 2014-01-30 ENCOUNTER — Ambulatory Visit (INDEPENDENT_AMBULATORY_CARE_PROVIDER_SITE_OTHER): Payer: Self-pay | Admitting: Obstetrics and Gynecology

## 2014-01-30 DIAGNOSIS — Z3202 Encounter for pregnancy test, result negative: Secondary | ICD-10-CM

## 2014-01-30 DIAGNOSIS — Z3200 Encounter for pregnancy test, result unknown: Secondary | ICD-10-CM

## 2014-01-30 LAB — POCT URINE PREGNANCY: PREG TEST UR: NEGATIVE

## 2014-01-30 NOTE — Patient Instructions (Addendum)
Por favor, evitar la actividad sexual durante 2 semanas ms mientras se recupera   Eleccin del mtodo anticonceptivo (Contraception Choices) La anticoncepcin (control de la natalidad) es el uso de cualquier mtodo o dispositivo para Therapist, occupational. A continuacin se indican algunos de esos mtodos. ANTICONCEPTIVOS HORMONALES  Un pequeo tubo colocado bajo la piel de la parte superior del brazo (implante). El tubo puede Nutritional therapist durante 3 aos. El implante debe quitarse despus de 3 aos.  Inyecciones que se aplican cada 3 meses.  Pldoras que deben [employer].  Parches que se cambian una vez por semana.  Un anillo que se coloca en la vagina (anillo vaginal). El anillo se deja en su lugar durante 3 semanas y se retira durante 1 semana. Luego se coloca un nuevo anillo en la vagina.  Pldoras para el control de la natalidad despus de Best boy sexo (relaciones sexuales) sin proteccin. ANTICONCEPTIVOS DE Ophelia Charter cubierta delgada que se [place] sobe el pene (condn masculino) que se coloca durante las relaciones sexuales.  Una cubierta blanda y suelta que se coloca en la vagina (condn femenino) antes de las relaciones sexuales.  Un dispositivo de goma que se aplica sobre el cuello del tero (diafragma). Este dispositivo debe ser hecho para usted. Se coloca en la vagina antes de Clinical biochemist. Debe dejarlo colocado en la vagina durante 6 a 8 horas despus de las [employer].  Un capuchn pequeo y [employer] se fija sobre el cuello del tero (capuchn cervical). Este capuchn debe ser hecho para usted. Debe dejarlo colocado en la vagina durante 48 horas despus de las [employer].  Una esponja que se coloca en la vagina antes de Clinical biochemist.  Una sustancia qumica que destruye o impide que los espermatozoides ingresen al cuello y al tero (espermicida). La sustancia qumica puede ser en crema, gel, espuma o  pldoras. DISPOSITIVO DE CONTROL INTRAUTERINO (DIU)   El DIU es un pequeo dispositivo plstico en forma de T. Se coloca dentro del tero. Hay dos tipos de DIU:  DIU de cobre. El dispositivo est recubierto en alambre de cobre. El cobre produce un lquido que [employer] espermatozoides. Puede permanecer colocado durante 10 aos.  DIU hormonal. La hormona impide que ocurra el embarazo. Puede permanecer colocado durante 5 aos. MTODOS PERMANENTES  La mujer puede hacerse sellar, ligar u obstruir las trompas de [place] durante [place]. Esto impide que el vulo llegue hasta el tero.  El mdico coloca un alambre diminuto o lo inserta en cada una de las trompas de [place]. Esto produce un tejido cicatrizal que obstruye las trompas de [place].  En el hombre pueden ligarse los conductos por los que pasan los espermatozoides (vasectoma). CONTROL DE LA NATALIDAD POR PLANIFICACIN FAMILIAR NATURAL   La planificacin familiar natural significa no tener Armed forces operational officer o usar un mtodo anticonceptivo de Chiropractor en los perodos frtiles de la [place].  Use a un almanaque para llevar un registro de la extensin de cada perodo y para [employer] que puede quedar [place].  Evite tener relaciones sexuales durante la ovulacin.  Use un termmetro para medir la Firefighter. Tambin reconozca los sntomas de la ovulacin.  El momento de [employer] relaciones sexuales debe ser despus de que la mujer [place]. Use condones para protegerse de las enfermedades de transmisin sexual (ETS). Hgalo independientemente del tipo de [employer] use. Hable con su mdico acerca de cul es el mejor mtodo  anticonceptivo para usted. Document Released: 12/20/2010 Document Revised: 05/07/2013 [facility] Patient Information 2014 [place], [place].

## 2014-01-30 NOTE — Progress Notes (Signed)
This chart was scribed by Jenne Campus, Medical Scribe, for Dr. Mallory Shirk on 01/30/14 at 10:54 AM. This chart was reviewed by Dr. Mallory Shirk for accuracy.   Patient ID: Alison Mendoza, female   DOB: 10/21/1980, 33 y.o.   MRN: [id number] Subjective:  Alison Mendoza is a 33 y.o. female who presents for a 6 weeks postpartum visit  Patient concerns: healing from delivery laceration  Prenatal and intrapartum course notable for none. EPDS is 24. Pt answered questions differently when asked in the room. She denies that she has a depression problem.   Patient is not sexually active.   The following portions of the patient's history were reviewed and updated as appropriate: allergies, current medications, past family history, past medical history, past surgical history and problem list.  Review of Systems    See Subjective, otherwise negative ROS.  Objective:  BP 96/68  Wt 140 lb (63.504 kg)  Breastfeeding? Yes  General:  alert, cooperative and no distress     Lungs: clear to auscultation bilaterally  Heart:  regular rate and rhythm, S1, S2 normal, no murmur  Abdomen: soft, non-tender; bowel sounds normal; no masses,  no organomegaly   Vulva:  normal, s/p deep secondary laceration  Vagina: normal vagina  Cervix:  normal  Corpus: normal size, contour, position, consistency, mobility, non-tender  Adnexa:  normal adnexa          Assessment:  1.  postpartum exam.  2. Contraception: condom 3. Avoid sexual activity for 2 more weeks  Plan:  F/u in one year

## 2014-01-30 NOTE — Progress Notes (Signed)
Patient ID: Alison Mendoza, female   DOB: 01-13-1981, 33 y.o.   MRN: [id number] Pt given spanish post partum depression scale that will be scanned into chart.

## 2014-06-15 ENCOUNTER — Inpatient Hospital Stay (HOSPITAL_COMMUNITY): Payer: Medicaid Other

## 2014-06-15 ENCOUNTER — Inpatient Hospital Stay (HOSPITAL_COMMUNITY)
Admission: AD | Admit: 2014-06-15 | Discharge: 2014-06-15 | Disposition: A | Discharge status: Home / Self Care | Payer: Medicaid Other | Source: Ambulatory Visit | Attending: Family Medicine | Admitting: Family Medicine

## 2014-06-15 ENCOUNTER — Encounter (HOSPITAL_COMMUNITY): Payer: Self-pay

## 2014-06-15 ENCOUNTER — Inpatient Hospital Stay (HOSPITAL_COMMUNITY): Payer: Self-pay

## 2014-06-15 DIAGNOSIS — N940 Mittelschmerz: Secondary | ICD-10-CM

## 2014-06-15 LAB — URINALYSIS, ROUTINE W REFLEX MICROSCOPIC
Bilirubin Urine: NEGATIVE
Glucose, UA: NEGATIVE mg/dL
Hgb urine dipstick: NEGATIVE
Ketones, ur: NEGATIVE mg/dL
LEUKOCYTES UA: NEGATIVE
NITRITE: NEGATIVE
PH: 6 (ref 5.0–8.0)
Protein, ur: NEGATIVE mg/dL
SPECIFIC GRAVITY, URINE: 1.025 (ref 1.005–1.030)
Urobilinogen, UA: 0.2 mg/dL (ref 0.0–1.0)

## 2014-06-15 LAB — CBC WITH DIFFERENTIAL/PLATELET
Basophils Absolute: 0 10*3/uL (ref 0.0–0.1)
Basophils Relative: 0 % (ref 0–1)
EOS ABS: 0.1 10*3/uL (ref 0.0–0.7)
Eosinophils Relative: 1 % (ref 0–5)
HCT: 38.1 % (ref 36.0–46.0)
Hemoglobin: 12.7 g/dL (ref 12.0–15.0)
Lymphocytes Relative: 26 % (ref 12–46)
Lymphs Abs: 1.8 10*3/uL (ref 0.7–4.0)
MCH: 29.7 pg (ref 26.0–34.0)
MCHC: 33.3 g/dL (ref 30.0–36.0)
MCV: 89 fL (ref 78.0–100.0)
Monocytes Absolute: 0.4 10*3/uL (ref 0.1–1.0)
Monocytes Relative: 6 % (ref 3–12)
NEUTROS PCT: 67 % (ref 43–77)
Neutro Abs: 4.6 10*3/uL (ref 1.7–7.7)
PLATELETS: 181 10*3/uL (ref 150–400)
RBC: 4.28 MIL/uL (ref 3.87–5.11)
RDW: 13.7 % (ref 11.5–15.5)
WBC: 6.9 10*3/uL (ref 4.0–10.5)

## 2014-06-15 LAB — COMPREHENSIVE METABOLIC PANEL
ALBUMIN: 3.7 g/dL (ref 3.5–5.2)
ALT: 12 U/L (ref 0–35)
ANION GAP: 12 (ref 5–15)
AST: 15 U/L (ref 0–37)
Alkaline Phosphatase: 127 U/L — ABNORMAL HIGH (ref 39–117)
BUN: 12 mg/dL (ref 6–23)
CO2: 25 mEq/L (ref 19–32)
Calcium: 8.5 mg/dL (ref 8.4–10.5)
Chloride: 105 mEq/L (ref 96–112)
Creatinine, Ser: 0.65 mg/dL (ref 0.50–1.10)
GFR calc Af Amer: >90 mL/min (ref >90)
Glucose, Bld: 85 mg/dL (ref 70–99)
POTASSIUM: 3.8 meq/L (ref 3.7–5.3)
SODIUM: 142 meq/L (ref 137–147)
TOTAL PROTEIN: 7.2 g/dL (ref 6.0–8.3)
Total Bilirubin: 0.3 mg/dL (ref 0.3–1.2)

## 2014-06-15 LAB — POCT PREGNANCY, URINE: Preg Test, Ur: NEGATIVE

## 2014-06-15 MED ORDER — IBUPROFEN 600 MG PO TABS: 600.0000 mg | ORAL_TABLET | Freq: Four times a day (QID) | ORAL | Status: DC | PRN

## 2014-06-15 MED ORDER — PROMETHAZINE HCL 25 MG PO TABS
25.0000 mg | ORAL_TABLET | Freq: Once | ORAL | Status: AC
  Administered 2014-06-15: 25 mg via ORAL
  Filled 2014-06-15: qty 1

## 2014-06-15 MED ORDER — KETOROLAC TROMETHAMINE 60 MG/2ML IM SOLN
60.0000 mg | Freq: Once | INTRAMUSCULAR | Status: AC
  Administered 2014-06-15: 60 mg via INTRAMUSCULAR
  Filled 2014-06-15: qty 2

## 2014-06-15 NOTE — MAU Provider Note (Signed)
Attestation of Attending Supervision of Advanced Practitioner (PA/CNM/NP): Evaluation and management procedures were performed by the Advanced Practitioner under my supervision and collaboration.  I have reviewed the Advanced Practitioner's note and chart, and I agree with the management and plan.  Jacob Stinson, DO Attending Physician Faculty Practice, [facility]   

## 2014-06-15 NOTE — MAU Note (Signed)
Pt presents complaining of abdominal pain that started Wednesday with vomiting. The pain is in the lower right quadrant and she rates it 9/10. Has not taken any medication for the pain. Denies vaginal bleeding or discharge. LMP 9/12.

## 2014-06-15 NOTE — Progress Notes (Signed)
I assisted Vaughan Basta NP with explanation of care plan,  [place] Interpreter

## 2014-06-15 NOTE — MAU Provider Note (Signed)
History     CSN: [id number]  Arrival date and time: 06/15/14 1022   None     Chief Complaint  Patient presents with  . Abdominal Pain   HPI  Alison Mendoza is a 33 y.o. [id number] Hispanic female that arrived to the MAU complaining of abdominal pain, headaches, and nausea. She states the abdominal pain and nausea began on Wednesday of last week and have been worsening. She grades the pain at a 9/10 and states that it is cramping burning pain. She localized the pain her lower abdomen with the right quadrant being worse than the left. The nausea has worsened with episodes of vomiting over the past 2 days she tried drinking herbal tea to alleviate the pain and it worked briefly. She also complains of a headache that started 2 days ago and is graded at a 7/10. She describes it as sharp and pulsating pain. The patient admits to having episodes of dizziness. She denies any vaginal bleeding or discharge, fevers, chills, double vision, constipation, diarrhea, dysuria, or changes in frequency of urination.    LMP: September 12   OB History   Grav Para Term Preterm Abortions TAB SAB Ect Mult Living   2 2 2  0 0 0 0 0 0 2      Past Medical History  Diagnosis Date  . Headache(784.0)   . Abnormal Pap smear   . History of worms     3 yrs ago    Past Surgical History  Procedure Laterality Date  . Colposcopy    . Cesarean section  2003    done in [place]; vertical ext scar; unknown int. scar    Family History  Problem Relation Age of Onset  . Diabetes Sister     History  Substance Use Topics  . Smoking status: Never Smoker   . Smokeless tobacco: Never Used  . Alcohol Use: No    Allergies: No Known Allergies  Prescriptions prior to admission  Medication Sig Dispense Refill  . docusate sodium (COLACE) 100 MG capsule Take 1 capsule (100 mg total) by mouth daily.  30 capsule  0  . oxyCODONE-acetaminophen (PERCOCET/ROXICET) 5-325 MG per tablet Take 1-2 tablets by mouth every 4  (four) hours as needed for severe pain (moderate - severe pain).  30 tablet  0  . Prenatal Vit-Fe Fumarate-FA (PRENATAL MULTIVITAMIN) TABS tablet Take 1 tablet by mouth daily at 12 noon.      Marland Kitchen zolpidem (AMBIEN) 10 MG tablet Take 1 tablet (10 mg total) by mouth at bedtime as needed for sleep.  15 tablet  0   Results for orders placed during the hospital encounter of 06/15/14 (from the past 24 hour(s))  URINALYSIS, ROUTINE W REFLEX MICROSCOPIC     Status: Abnormal   Collection Time    06/15/14 10:45 AM      Result Value Ref Range   Color, Urine YELLOW  YELLOW   APPearance HAZY (*) CLEAR   Specific Gravity, Urine 1.025  1.005 - 1.030   pH 6.0  5.0 - 8.0   Glucose, UA NEGATIVE  NEGATIVE mg/dL   Hgb urine dipstick NEGATIVE  NEGATIVE   Bilirubin Urine NEGATIVE  NEGATIVE   Ketones, ur NEGATIVE  NEGATIVE mg/dL   Protein, ur NEGATIVE  NEGATIVE mg/dL   Urobilinogen, UA 0.2  0.0 - 1.0 mg/dL   Nitrite NEGATIVE  NEGATIVE   Leukocytes, UA NEGATIVE  NEGATIVE  POCT PREGNANCY, URINE     Status: None   Collection Time  06/15/14 10:48 AM      Result Value Ref Range   Preg Test, Ur NEGATIVE  NEGATIVE  CBC WITH DIFFERENTIAL     Status: None   Collection Time    06/15/14 11:41 AM      Result Value Ref Range   WBC 6.9  4.0 - 10.5 K/uL   RBC 4.28  3.87 - 5.11 MIL/uL   Hemoglobin 12.7  12.0 - 15.0 g/dL   HCT 38.1  36.0 - 46.0 %   MCV 89.0  78.0 - 100.0 fL   MCH 29.7  26.0 - 34.0 pg   MCHC 33.3  30.0 - 36.0 g/dL   RDW 13.7  11.5 - 15.5 %   Platelets 181  150 - 400 K/uL   Neutrophils Relative % 67  43 - 77 %   Neutro Abs 4.6  1.7 - 7.7 K/uL   Lymphocytes Relative 26  12 - 46 %   Lymphs Abs 1.8  0.7 - 4.0 K/uL   Monocytes Relative 6  3 - 12 %   Monocytes Absolute 0.4  0.1 - 1.0 K/uL   Eosinophils Relative 1  0 - 5 %   Eosinophils Absolute 0.1  0.0 - 0.7 K/uL   Basophils Relative 0  0 - 1 %   Basophils Absolute 0.0  0.0 - 0.1 K/uL  COMPREHENSIVE METABOLIC PANEL     Status: Abnormal    Collection Time    06/15/14 11:41 AM      Result Value Ref Range   Sodium 142  137 - 147 mEq/L   Potassium 3.8  3.7 - 5.3 mEq/L   Chloride 105  96 - 112 mEq/L   CO2 25  19 - 32 mEq/L   Glucose, Bld 85  70 - 99 mg/dL   BUN 12  6 - 23 mg/dL   Creatinine, Ser 0.65  0.50 - 1.10 mg/dL   Calcium 8.5  8.4 - 10.5 mg/dL   Total Protein 7.2  6.0 - 8.3 g/dL   Albumin 3.7  3.5 - 5.2 g/dL   AST 15  0 - 37 U/L   ALT 12  0 - 35 U/L   Alkaline Phosphatase 127 (*) 39 - 117 U/L   Total Bilirubin 0.3  0.3 - 1.2 mg/dL   GFR calc non Af Amer >90  >90 mL/min   GFR calc Af Amer >90  >90 mL/min   Anion gap 12  5 - 15    US Transvaginal Non-ob  06/15/2014   CLINICAL DATA:  Pelvic pain on the right for 5 days.  EXAM: TRANSABDOMINAL AND TRANSVAGINAL ULTRASOUND OF PELVIS  DOPPLER ULTRASOUND OF OVARIES  TECHNIQUE: Both transabdominal and transvaginal ultrasound examinations of the pelvis were performed. Transabdominal technique was performed for global imaging of the pelvis including uterus, ovaries, adnexal regions, and pelvic cul-de-sac.  It was necessary to proceed with endovaginal exam following the transabdominal exam to visualize the adnexal. Color and duplex Doppler ultrasound was utilized to evaluate blood flow to the ovaries.  COMPARISON:  None.  FINDINGS: Uterus  Measurements: 7.6 x 3.6 x 5.0 cm. No fibroids or other mass visualized.  Endometrium  Thickness: 0.9 cm.  No focal abnormality visualized.  Right ovary  Measurements: 3.5 x 2.3 x 1.7 cm. Normal appearance/no adnexal mass.  Left ovary  Measurements: 2.3 x 1.5 x 1.2 cm. Normal appearance/no adnexal mass.  Pulsed Doppler evaluation of both ovaries demonstrates normal low-resistance arterial and venous waveforms.  Other findings  No free  fluid.  IMPRESSION: Negative for ovarian torsion.  Normal examination.   Electronically Signed   By: Inge Rise M.D.   On: 06/15/2014 14:31   US Pelvis Complete  06/15/2014   CLINICAL DATA:  Pelvic pain on the  right for 5 days.  EXAM: TRANSABDOMINAL AND TRANSVAGINAL ULTRASOUND OF PELVIS  DOPPLER ULTRASOUND OF OVARIES  TECHNIQUE: Both transabdominal and transvaginal ultrasound examinations of the pelvis were performed. Transabdominal technique was performed for global imaging of the pelvis including uterus, ovaries, adnexal regions, and pelvic cul-de-sac.  It was necessary to proceed with endovaginal exam following the transabdominal exam to visualize the adnexal. Color and duplex Doppler ultrasound was utilized to evaluate blood flow to the ovaries.  COMPARISON:  None.  FINDINGS: Uterus  Measurements: 7.6 x 3.6 x 5.0 cm. No fibroids or other mass visualized.  Endometrium  Thickness: 0.9 cm.  No focal abnormality visualized.  Right ovary  Measurements: 3.5 x 2.3 x 1.7 cm. Normal appearance/no adnexal mass.  Left ovary  Measurements: 2.3 x 1.5 x 1.2 cm. Normal appearance/no adnexal mass.  Pulsed Doppler evaluation of both ovaries demonstrates normal low-resistance arterial and venous waveforms.  Other findings  No free fluid.  IMPRESSION: Negative for ovarian torsion.  Normal examination.   Electronically Signed   By: Inge Rise M.D.   On: 06/15/2014 14:31   [place] Art/ven Flow Abd Pelv Doppler  06/15/2014   CLINICAL DATA:  Pelvic pain on the right for 5 days.  EXAM: TRANSABDOMINAL AND TRANSVAGINAL ULTRASOUND OF PELVIS  DOPPLER ULTRASOUND OF OVARIES  TECHNIQUE: Both transabdominal and transvaginal ultrasound examinations of the pelvis were performed. Transabdominal technique was performed for global imaging of the pelvis including uterus, ovaries, adnexal regions, and pelvic cul-de-sac.  It was necessary to proceed with endovaginal exam following the transabdominal exam to visualize the adnexal. Color and duplex Doppler ultrasound was utilized to evaluate blood flow to the ovaries.  COMPARISON:  None.  FINDINGS: Uterus  Measurements: 7.6 x 3.6 x 5.0 cm. No fibroids or other mass visualized.  Endometrium  Thickness: 0.9  cm.  No focal abnormality visualized.  Right ovary  Measurements: 3.5 x 2.3 x 1.7 cm. Normal appearance/no adnexal mass.  Left ovary  Measurements: 2.3 x 1.5 x 1.2 cm. Normal appearance/no adnexal mass.  Pulsed Doppler evaluation of both ovaries demonstrates normal low-resistance arterial and venous waveforms.  Other findings  No free fluid.  IMPRESSION: Negative for ovarian torsion.  Normal examination.   Electronically Signed   By: Inge Rise M.D.   On: 06/15/2014 14:31     Review of Systems  Constitutional: Positive for malaise/fatigue. Negative for fever and chills.  Cardiovascular: Negative for chest pain.  Gastrointestinal: Positive for nausea, vomiting and abdominal pain. Negative for diarrhea and constipation.  Genitourinary: Negative for dysuria, urgency and frequency.  Skin: Negative for rash.  Neurological: Positive for dizziness and headaches.   Physical Exam   Blood pressure 106/69, pulse 64, temperature 98.6 F (37 C), temperature source Oral, resp. rate 18, height 4\' 11"  (1.499 m), weight 62.596 kg (138 lb), last menstrual period 05/30/2014, currently breastfeeding.  Physical Exam  Vitals reviewed. Constitutional: She is oriented to person, place, and time. She appears well-developed and well-nourished. She appears distressed (mild).  HENT:  Head: Normocephalic.  Eyes: Conjunctivae are normal.  Neck: Normal range of motion.  Cardiovascular: Normal rate, regular rhythm and normal heart sounds.   Respiratory: Effort normal.  GI: Soft. Normal appearance and bowel sounds are normal. There is tenderness  in the right lower quadrant and suprapubic area. There is no rigidity, no rebound and no guarding.  Neurological: She is alert and oriented to person, place, and time.  Skin: Skin is warm and dry.  Pelvic exam: normal external genitalia, vulva, vagina, cervix, uterus and adnexa, exam chaperoned by Vaughan Basta .   MAU Course  Procedures none MDM Keterolac Inj  60mg  Promethazine 25 mg  Patient rates her pain 5/10; Pelvic US complete ordered   Assessment and Plan  Assessment: 1. Mittelschmerz   2.  Negative fever, negative rebound, normal white count  Plan: Discharge home in stable condition  Return to MAU if symptoms worsen RX: ibuprofen   Alison Mendoza 06/15/2014, 12:00 PM   Evaluation and management procedures were performed by the PA student under my supervision and collaboration. I have reviewed the note and chart, and I agree with the management and plan.  Darrelyn Hillock Emmett Arntz, NP 06/15/2014 3:00 PM

## 2014-06-15 NOTE — MAU Note (Signed)
Pt was going to ultrasound but was delayed due to breastfeeding her child. Transported to u/s at this time

## 2014-06-15 NOTE — Discharge Instructions (Signed)
Mittelschmerz  Consulting civil engineer)  Mittelschmerz es Conservation officer, historic buildings en la zona baja del abdomen que aparece entre los perodos [place]. Puede sentir Conservation officer, historic buildings inmediatamente antes, durante o despus de que el ovario libera el vulo(ovulacin). CUIDADOS EN EL HOGAR   Tome la medicacin slo como le haya indicado el mdico. No tome aspirina.  Anote el momento en que el dolor comienza. Anote la intensidad del dolor, si se acompaa de fiebre y la duracin del mismo. SOLICITE AYUDA DE INMEDIATO SI:   Siente un dolor ms intenso y los medicamentos no [employer].  Tiene una hemorragia por la vagina (ms que algunas manchas de [place]) y Tree surgeon.  Tiene Higher education careers adviser (nuseas) y vmitos.  Tiene fiebre.  Se siente dbil, sufre mareos o se desvanece (se desmaya). ASEGRESE DE QUE:   Comprende estas instrucciones.  Controlar su enfermedad.  Solicitar ayuda de inmediato si no mejora o si empeora. Document Released: 02/06/2011 Document Revised: 11/27/2011 [facility] Patient Information 2015 [place]. This information is not intended to replace advice given to you by your health care provider. Make sure you discuss any questions you have with your health care provider.

## 2014-07-20 ENCOUNTER — Encounter (HOSPITAL_COMMUNITY): Payer: Self-pay

## 2015-12-29 ENCOUNTER — Ambulatory Visit: Payer: Self-pay | Admitting: Physician Assistant

## 2015-12-29 ENCOUNTER — Encounter: Payer: Self-pay | Admitting: Physician Assistant

## 2015-12-29 VITALS — BP 102/64 | HR 80 | Temp 98.1°F | Ht 59.0 in | Wt 148.0 lb

## 2015-12-29 DIAGNOSIS — N898 Other specified noninflammatory disorders of vagina: Secondary | ICD-10-CM

## 2015-12-29 DIAGNOSIS — R51 Headache: Secondary | ICD-10-CM

## 2015-12-29 DIAGNOSIS — Z131 Encounter for screening for diabetes mellitus: Secondary | ICD-10-CM

## 2015-12-29 DIAGNOSIS — R519 Headache, unspecified: Secondary | ICD-10-CM | POA: Insufficient documentation

## 2015-12-29 DIAGNOSIS — R109 Unspecified abdominal pain: Secondary | ICD-10-CM

## 2015-12-29 DIAGNOSIS — Z13220 Encounter for screening for lipoid disorders: Secondary | ICD-10-CM

## 2015-12-29 LAB — GLUCOSE, POCT (MANUAL RESULT ENTRY): POC Glucose: 131 mg/dl — AB (ref 70–99)

## 2015-12-29 NOTE — Progress Notes (Signed)
BP 102/64 mmHg  Pulse 80  Temp(Src) 98.1 F (36.7 C)  Ht 4\' 11"  (1.499 m)  Wt 148 lb (67.132 kg)  BMI 29.88 kg/m2  SpO2 98%   Subjective:    Patient ID: Alison Mendoza, female    DOB: [birth year], 35 y.o.   MRN: [id number]  HPI: Alison Mendoza is a 35 y.o. female presenting on 12/29/2015 for New Patient (Initial Visit); Headache; and Abdominal Pain   HPI Chief Complaint  Patient presents with  . New Patient (Initial Visit)    previous pt of [facility]  . Headache    c/o of L sided HA. 4 days/week takes ibu and is sometimes helpful  . Abdominal Pain    R sided abd pain    During visit, pt is busy with her young child who is running around room.  -pt c/o frequent headaches x 1 year.   IBU helps some  -pt abdominal pain x 2 year.  Says she went to hospital for it about 2 years ago in [place] and they told her it was due to her menstrual cycle.  Says she gets vomiting and nausea when she gets her menses.  Pt says her menses are heavy but regular. They come once month and last for 3 days.  Chart review- nl [place] 06/15/14.   Pt had colposcopy she says maybe around 2011 but she doesn't really remember.   She says the [facility] told her she needed another one in November this year.  She had her prveious one done there.   When asked why she came to [facility] since she was already established pt of [place], she said it was b/c [facility] closer to [place] home.  Pt also c/o some vaginal itching.  She says she had this about a month ago and was seen [facility] it [facility].  She says they told her to continue to use the cream that she had been using.  Relevant past medical, surgical, family and social history reviewed and updated as indicated. Interim medical history since our last visit reviewed. Allergies and medications reviewed and updated.  No current outpatient prescriptions on file.   Review of Systems  Constitutional: Positive for chills. Negative for fever, diaphoresis, appetite  change, fatigue and unexpected weight change.  HENT: Positive for dental problem and sore throat. Negative for congestion, drooling, ear pain, facial swelling, hearing loss, mouth sores, sneezing and voice change.   Eyes: Negative for pain, discharge, redness, itching and visual disturbance.  Respiratory: Negative for cough, choking, shortness of breath and wheezing.   Cardiovascular: Negative for chest pain, palpitations and leg swelling.  Gastrointestinal: Positive for vomiting and diarrhea. Negative for abdominal pain, constipation and blood in stool.  Endocrine: Negative for cold intolerance, heat intolerance and polydipsia.  Genitourinary: Negative for dysuria, hematuria and decreased urine volume.  Musculoskeletal: Negative for back pain, arthralgias and gait problem.  Skin: Negative for rash.  Allergic/Immunologic: Negative for environmental allergies.  Neurological: Negative for seizures, syncope, light-headedness and headaches.  Hematological: Negative for adenopathy.  Psychiatric/Behavioral: Negative for suicidal ideas, dysphoric mood and agitation. The patient is nervous/anxious.     Per HPI unless specifically indicated above     Objective:    BP 102/64 mmHg  Pulse 80  Temp(Src) 98.1 F (36.7 C)  Ht 4\' 11"  (1.499 m)  Wt 148 lb (67.132 kg)  BMI 29.88 kg/m2  SpO2 98%  Wt Readings from Last 3 Encounters:  12/29/15 148 lb (67.132 kg)  06/15/14 138 lb (62.596 kg)  01/30/14 140 lb (63.504 kg)    Physical Exam  Constitutional: She is oriented to person, place, and time. She appears well-developed and well-nourished.  HENT:  Head: Normocephalic and atraumatic.  Mouth/Throat: Oropharynx is clear and moist. No oropharyngeal exudate.  Eyes: Conjunctivae and EOM are normal. Pupils are equal, round, and reactive to light.  Neck: Neck supple. No thyromegaly present.  Cardiovascular: Normal rate and regular rhythm.   Pulmonary/Chest: Effort normal and breath sounds normal.   Abdominal: Soft. Bowel sounds are normal. She exhibits no mass. There is no hepatosplenomegaly. There is no tenderness.  Musculoskeletal: She exhibits no edema.  Lymphadenopathy:    She has no cervical adenopathy.  Neurological: She is alert and oriented to person, place, and time. She has normal strength. She displays no tremor. No cranial nerve deficit or sensory deficit. She exhibits normal muscle tone. Coordination and gait normal.  Reflex Scores:      Patellar reflexes are 2+ on the right side and 2+ on the left side. Skin: Skin is warm and dry.  Psychiatric: She has a normal mood and affect. Her behavior is normal.  Vitals reviewed.   Results for orders placed or performed in visit on 12/29/15  POCT Glucose (CBG)  Result Value Ref Range   POC Glucose 131 (A) 70 - 99 mg/dl      Assessment & Plan:    Encounter Diagnoses  Name Primary?  . Headache, unspecified headache type   . Abdominal pain, unspecified abdominal location   . Vagina itching   . Screening for diabetes mellitus Yes  . Screening cholesterol level      -Get baseline labs -rec otc monitstat for vaginal itching.  Will evaluate further at next OV if it persists -discussed with pt that she would be able to focus on her visit better if she got someone to watch her child while she came in for her appointment -F/u 1 month

## 2015-12-31 LAB — CBC WITH DIFFERENTIAL/PLATELET
BASOS PCT: 1 %
Basophils Absolute: 96 cells/uL (ref 0–200)
Eosinophils Absolute: 192 cells/uL (ref 15–500)
Eosinophils Relative: 2 %
HEMATOCRIT: 40.2 % (ref 35.0–45.0)
HEMOGLOBIN: 13.3 g/dL (ref 11.7–15.5)
LYMPHS ABS: 1920 {cells}/uL (ref 850–3900)
Lymphocytes Relative: 20 %
MCH: 29.1 pg (ref 27.0–33.0)
MCHC: 33.1 g/dL (ref 32.0–36.0)
MCV: 88 fL (ref 80.0–100.0)
MONO ABS: 672 {cells}/uL (ref 200–950)
MPV: 13.5 fL — AB (ref 7.5–12.5)
Monocytes Relative: 7 %
NEUTROS ABS: 6720 {cells}/uL (ref 1500–7800)
Neutrophils Relative %: 70 %
Platelets: 196 10*3/uL (ref 140–400)
RBC: 4.57 MIL/uL (ref 3.80–5.10)
RDW: 14.2 % (ref 11.0–15.0)
WBC: 9.6 10*3/uL (ref 3.8–10.8)

## 2015-12-31 LAB — LIPID PANEL
CHOL/HDL RATIO: 3.3 ratio (ref <=5.0)
CHOLESTEROL: 189 mg/dL (ref 125–200)
HDL: 58 mg/dL (ref >=46)
LDL CALC: 93 mg/dL (ref <130)
TRIGLYCERIDES: 188 mg/dL — AB (ref <150)
VLDL: 38 mg/dL — AB (ref <30)

## 2015-12-31 LAB — COMPLETE METABOLIC PANEL WITH GFR
ALT: 14 U/L (ref 6–29)
AST: 15 U/L (ref 10–30)
Albumin: 4.4 g/dL (ref 3.6–5.1)
Alkaline Phosphatase: 62 U/L (ref 33–115)
BUN: 14 mg/dL (ref 7–25)
CHLORIDE: 104 mmol/L (ref 98–110)
CO2: 23 mmol/L (ref 20–31)
Calcium: 9.3 mg/dL (ref 8.6–10.2)
Creat: 0.58 mg/dL (ref 0.50–1.10)
GFR, Est Non African American: >89 mL/min (ref >=60)
Glucose, Bld: 87 mg/dL (ref 65–99)
POTASSIUM: 4 mmol/L (ref 3.5–5.3)
Sodium: 138 mmol/L (ref 135–146)
Total Bilirubin: 0.5 mg/dL (ref 0.2–1.2)
Total Protein: 7.5 g/dL (ref 6.1–8.1)

## 2015-12-31 LAB — HEMOGLOBIN A1C
Hgb A1c MFr Bld: 5.3 % (ref <5.7)
Mean Plasma Glucose: 105 mg/dL

## 2015-12-31 LAB — TSH: TSH: 2.64 mIU/L

## 2016-01-26 ENCOUNTER — Encounter: Payer: Self-pay | Admitting: Physician Assistant

## 2016-01-26 ENCOUNTER — Ambulatory Visit: Payer: Self-pay | Admitting: Physician Assistant

## 2016-01-26 VITALS — BP 104/68 | HR 94 | Temp 97.9°F | Ht 59.0 in | Wt 152.0 lb

## 2016-01-26 DIAGNOSIS — E669 Obesity, unspecified: Secondary | ICD-10-CM

## 2016-01-26 NOTE — Progress Notes (Signed)
BP 104/68 mmHg  Pulse 94  Temp(Src) 97.9 F (36.6 C)  Ht 4' 11"  (1.499 m)  Wt 152 lb (68.947 kg)  BMI 30.68 kg/m2  SpO2 98%  LMP 01/11/2016  Breastfeeding? No   Subjective:    Patient ID: Alison Mendoza, female    DOB: Oct 12, 1980, 35 y.o.   MRN: [id number]  HPI: Alison Mendoza is a 35 y.o. female presenting on 01/26/2016 for Headache and Abdominal Pain   HPI   Pt thinks she might be pregnant.  Her LMP was 4/25.  She thinks that she might be pregnant b/c she has been nauseous.  She states emesis also - yesterday 3 times.  Today no emesis just nausea.  She also c/o being very sleepy.  She denies diarrhea.    She has stopped breastfeeding her 104monthold.    Relevant past medical, surgical, family and social history reviewed and updated as indicated. Interim medical history since our last visit reviewed. Allergies and medications reviewed and updated.  No current outpatient prescriptions on file.   Review of Systems  Constitutional: Negative for fever, chills, diaphoresis, appetite change, fatigue and unexpected weight change.  HENT: Positive for sneezing. Negative for congestion, dental problem, drooling, ear pain, facial swelling, hearing loss, mouth sores, sore throat, trouble swallowing and voice change.   Eyes: Positive for redness. Negative for pain, discharge, itching and visual disturbance.  Respiratory: Negative for cough, choking, shortness of breath and wheezing.   Cardiovascular: Negative for chest pain, palpitations and leg swelling.  Gastrointestinal: Positive for nausea and vomiting. Negative for abdominal pain, diarrhea, constipation and blood in stool.  Endocrine: Negative for cold intolerance, heat intolerance and polydipsia.  Genitourinary: Negative for dysuria, hematuria and decreased urine volume.  Musculoskeletal: Positive for back pain. Negative for arthralgias and gait problem.  Skin: Negative for rash.  Allergic/Immunologic: Negative for  environmental allergies.  Neurological: Positive for headaches. Negative for seizures, syncope and light-headedness.  Hematological: Negative for adenopathy.  Psychiatric/Behavioral: Negative for suicidal ideas, dysphoric mood and agitation. The patient is not nervous/anxious.     Per HPI unless specifically indicated above     Objective:    BP 104/68 mmHg  Pulse 94  Temp(Src) 97.9 F (36.6 C)  Ht 4' 11"  (1.499 m)  Wt 152 lb (68.947 kg)  BMI 30.68 kg/m2  SpO2 98%  LMP 01/11/2016  Breastfeeding? No  Wt Readings from Last 3 Encounters:  01/26/16 152 lb (68.947 kg)  12/29/15 148 lb (67.132 kg)  06/15/14 138 lb (62.596 kg)    Physical Exam  Constitutional: She is oriented to person, place, and time. She appears well-developed and well-nourished.  HENT:  Head: Normocephalic and atraumatic.  Neck: Neck supple.  Cardiovascular: Normal rate and regular rhythm.   Pulmonary/Chest: Effort normal and breath sounds normal.  Abdominal: Soft. Bowel sounds are normal. She exhibits no mass. There is no hepatosplenomegaly. There is no tenderness.  Musculoskeletal: She exhibits no edema.  Lymphadenopathy:    She has no cervical adenopathy.  Neurological: She is alert and oriented to person, place, and time.  Skin: Skin is warm and dry.  Psychiatric: She has a normal mood and affect. Her behavior is normal.  Vitals reviewed.   Results for orders placed or performed in visit on 12/29/15  CBC w/Diff/Platelet  Result Value Ref Range   WBC 9.6 3.8 - 10.8 K/uL   RBC 4.57 3.80 - 5.10 MIL/uL   Hemoglobin 13.3 11.7 - 15.5 g/dL   HCT 40.2 35.0 - 45.0 %  MCV 88.0 80.0 - 100.0 fL   MCH 29.1 27.0 - 33.0 pg   MCHC 33.1 32.0 - 36.0 g/dL   RDW 14.2 11.0 - 15.0 %   Platelets 196 140 - 400 K/uL   MPV 13.5 (H) 7.5 - 12.5 fL   Neutro Abs 6720 1500 - 7800 cells/uL   Lymphs Abs 1920 850 - 3900 cells/uL   Monocytes Absolute 672 200 - 950 cells/uL   Eosinophils Absolute 192 15 - 500 cells/uL    Basophils Absolute 96 0 - 200 cells/uL   Neutrophils Relative % 70 %   Lymphocytes Relative 20 %   Monocytes Relative 7 %   Eosinophils Relative 2 %   Basophils Relative 1 %   Smear Review Criteria for review not met   COMPLETE METABOLIC PANEL WITH GFR  Result Value Ref Range   Sodium 138 135 - 146 mmol/L   Potassium 4.0 3.5 - 5.3 mmol/L   Chloride 104 98 - 110 mmol/L   CO2 23 20 - 31 mmol/L   Glucose, Bld 87 65 - 99 mg/dL   BUN 14 7 - 25 mg/dL   Creat 0.58 0.50 - 1.10 mg/dL   Total Bilirubin 0.5 0.2 - 1.2 mg/dL   Alkaline Phosphatase 62 33 - 115 U/L   AST 15 10 - 30 U/L   ALT 14 6 - 29 U/L   Total Protein 7.5 6.1 - 8.1 g/dL   Albumin 4.4 3.6 - 5.1 g/dL   Calcium 9.3 8.6 - 10.2 mg/dL   GFR, Est African American >89 >=60 mL/min   GFR, Est Non African American >89 >=60 mL/min  Lipid Profile  Result Value Ref Range   Cholesterol 189 125 - 200 mg/dL   Triglycerides 188 (H) <150 mg/dL   HDL 58 >=46 mg/dL   Total CHOL/HDL Ratio 3.3 <=5.0 Ratio   VLDL 38 (H) <30 mg/dL   LDL Cholesterol 93 <130 mg/dL  HgB A1c  Result Value Ref Range   Hgb A1c MFr Bld 5.3 <5.7 %   Mean Plasma Glucose 105 mg/dL  TSH  Result Value Ref Range   TSH 2.64 mIU/L  POCT Glucose (CBG)  Result Value Ref Range   POC Glucose 131 (A) 70 - 99 mg/dl      Assessment & Plan:   Encounter Diagnosis  Name Primary?  . Obesity, unspecified Yes   -reviewed labs with pt -recommended that pt get pregnancy test if she thinks she is pregnant.  They are available OTC and we are unable to provide that service at [facility] -F/u 6 months with pap at that appointment.  RTO sooner prn

## 2016-01-29 DIAGNOSIS — E669 Obesity, unspecified: Secondary | ICD-10-CM | POA: Insufficient documentation

## 2016-07-26 ENCOUNTER — Ambulatory Visit: Payer: Self-pay | Admitting: Physician Assistant

## 2016-07-31 ENCOUNTER — Encounter: Payer: Self-pay | Admitting: Physician Assistant

## 2016-08-14 ENCOUNTER — Ambulatory Visit: Payer: Self-pay | Admitting: Physician Assistant

## 2016-08-14 ENCOUNTER — Encounter: Payer: Self-pay | Admitting: Physician Assistant

## 2016-08-14 VITALS — BP 92/60 | HR 78 | Temp 98.1°F | Ht 59.0 in | Wt 156.5 lb

## 2016-08-14 DIAGNOSIS — E669 Obesity, unspecified: Secondary | ICD-10-CM

## 2016-08-14 DIAGNOSIS — Z6831 Body mass index (BMI) 31.0-31.9, adult: Principal | ICD-10-CM

## 2016-08-14 NOTE — Progress Notes (Signed)
BP 92/60 (BP Location: Left Arm, Patient Position: Sitting, Cuff Size: Normal)   Pulse 78   Temp 98.1 F (36.7 C)   Ht 4\' 11"  (1.499 m)   Wt 156 lb 8 oz (71 kg)   LMP 06/27/2016 (Exact Date)   SpO2 99%   BMI 31.61 kg/m    Subjective:    Patient ID: Alison Mendoza, female    DOB: [birth year], 35 y.o.   MRN: [id number]  HPI: Alison Mendoza is a 35 y.o. female presenting on 08/14/2016 for Gynecologic Exam   HPI  Pt is doing well today.   She was scheduled to get PAP today but she has her daughter with her who she says will cry if she is left in waiting room to watch cartoons.  Relevant past medical, surgical, family and social history reviewed and updated as indicated. Interim medical history since our last visit reviewed. Allergies and medications reviewed and updated.  No current outpatient prescriptions on file.   Review of Systems  Constitutional: Negative for appetite change, chills, diaphoresis, fatigue, fever and unexpected weight change.  HENT: Positive for sore throat. Negative for congestion, dental problem, drooling, ear pain, facial swelling, hearing loss, mouth sores, sneezing, trouble swallowing and voice change.   Eyes: Negative for pain, discharge, redness, itching and visual disturbance.  Respiratory: Negative for cough, choking, shortness of breath and wheezing.   Cardiovascular: Negative for chest pain, palpitations and leg swelling.  Gastrointestinal: Negative for abdominal pain, blood in stool, constipation, diarrhea and vomiting.  Endocrine: Negative for cold intolerance, heat intolerance and polydipsia.  Genitourinary: Negative for decreased urine volume, dysuria and hematuria.  Musculoskeletal: Negative for arthralgias, back pain and gait problem.  Skin: Negative for rash.  Allergic/Immunologic: Negative for environmental allergies.  Neurological: Positive for headaches. Negative for seizures, syncope and light-headedness.  Hematological: Negative  for adenopathy.  Psychiatric/Behavioral: Negative for agitation, dysphoric mood and suicidal ideas. The patient is not nervous/anxious.     Per HPI unless specifically indicated above     Objective:    BP 92/60 (BP Location: Left Arm, Patient Position: Sitting, Cuff Size: Normal)   Pulse 78   Temp 98.1 F (36.7 C)   Ht 4\' 11"  (1.499 m)   Wt 156 lb 8 oz (71 kg)   LMP 06/27/2016 (Exact Date)   SpO2 99%   BMI 31.61 kg/m   Wt Readings from Last 3 Encounters:  08/14/16 156 lb 8 oz (71 kg)  01/26/16 152 lb (68.9 kg)  12/29/15 148 lb (67.1 kg)    Physical Exam  Constitutional: She is oriented to person, place, and time. She appears well-developed and well-nourished.  HENT:  Head: Normocephalic and atraumatic.  Neck: Neck supple.  Cardiovascular: Normal rate and regular rhythm.   Pulmonary/Chest: Effort normal and breath sounds normal.  Abdominal: Soft. Bowel sounds are normal. She exhibits no mass. There is no hepatosplenomegaly. There is no tenderness.  Musculoskeletal: She exhibits no edema.  Lymphadenopathy:    She has no cervical adenopathy.  Neurological: She is alert and oriented to person, place, and time.  Skin: Skin is warm and dry.  Psychiatric: She has a normal mood and affect. Her behavior is normal.  Vitals reviewed.       Assessment & Plan:    Encounter Diagnosis  Name Primary?  . Class 1 obesity with body mass index (BMI) of 31.0 to 31.9 in adult, unspecified obesity type, unspecified whether serious comorbidity present Yes    Gave pt option of trying  PAP today but daughter is clingy and upset with mother on exam table to check heart and lung sounds. Discussed with pt and she will reschedule to get PAP over christmas break so that her 7yo daughter can stay with her 2yo in the waiting room.

## 2016-09-14 ENCOUNTER — Encounter: Payer: Self-pay | Admitting: Physician Assistant

## 2016-09-14 ENCOUNTER — Ambulatory Visit: Payer: Self-pay | Admitting: Physician Assistant

## 2016-09-14 ENCOUNTER — Other Ambulatory Visit: Payer: Self-pay | Admitting: Physician Assistant

## 2016-09-14 VITALS — BP 102/74 | HR 73 | Temp 98.1°F | Ht 59.0 in | Wt 156.0 lb

## 2016-09-14 DIAGNOSIS — Z124 Encounter for screening for malignant neoplasm of cervix: Secondary | ICD-10-CM

## 2016-09-14 DIAGNOSIS — J069 Acute upper respiratory infection, unspecified: Secondary | ICD-10-CM

## 2016-09-14 NOTE — Progress Notes (Signed)
BP 102/74 (BP Location: Left Arm, Patient Position: Sitting, Cuff Size: Normal)   Pulse 73   Temp 98.1 F (36.7 C) (Other (Comment))   Ht 4\' 11"  (1.499 m)   Wt 156 lb (70.8 kg)   LMP 08/23/2016 (Exact Date)   SpO2 99%   BMI 31.51 kg/m    Subjective:    Patient ID: Alison Mendoza, female    DOB: 1981/06/17, 35 y.o.   MRN: [id number]  HPI: Alison Mendoza is a 35 y.o. female presenting on 09/14/2016 for Gynecologic Exam   HPI   Pt here for PAP but she also c/o URI symptoms  Relevant past medical, surgical, family and social history reviewed and updated as indicated. Interim medical history since our last visit reviewed. Allergies and medications reviewed and updated.  CURRENT MEDS: none  Review of Systems  Constitutional: Positive for chills and fatigue. Negative for appetite change, diaphoresis, fever and unexpected weight change.  HENT: Positive for sore throat. Negative for congestion, drooling, ear pain, facial swelling, hearing loss, mouth sores, sneezing, trouble swallowing and voice change.   Eyes: Negative for pain, discharge, redness, itching and visual disturbance.  Respiratory: Positive for cough. Negative for choking, shortness of breath and wheezing.   Cardiovascular: Negative for chest pain, palpitations and leg swelling.  Gastrointestinal: Negative for abdominal pain, blood in stool, constipation, diarrhea and vomiting.  Endocrine: Negative for cold intolerance, heat intolerance and polydipsia.  Genitourinary: Negative for decreased urine volume, dysuria and hematuria.  Musculoskeletal: Negative for arthralgias, back pain and gait problem.  Skin: Negative for rash.  Allergic/Immunologic: Negative for environmental allergies.  Neurological: Positive for headaches. Negative for seizures, syncope and light-headedness.  Hematological: Negative for adenopathy.  Psychiatric/Behavioral: Negative for agitation, dysphoric mood and suicidal ideas. The patient is  not nervous/anxious.     Per HPI unless specifically indicated above     Objective:    BP 102/74 (BP Location: Left Arm, Patient Position: Sitting, Cuff Size: Normal)   Pulse 73   Temp 98.1 F (36.7 C) (Other (Comment))   Ht 4\' 11"  (1.499 m)   Wt 156 lb (70.8 kg)   LMP 08/23/2016 (Exact Date)   SpO2 99%   BMI 31.51 kg/m   Wt Readings from Last 3 Encounters:  09/14/16 156 lb (70.8 kg)  08/14/16 156 lb 8 oz (71 kg)  01/26/16 152 lb (68.9 kg)    Physical Exam  Constitutional: She is oriented to person, place, and time. She appears well-developed and well-nourished.  HENT:  Head: Normocephalic and atraumatic.  Right Ear: Hearing, tympanic membrane, external ear and ear canal normal.  Left Ear: Hearing, tympanic membrane, external ear and ear canal normal.  Nose: Nose normal.  Mouth/Throat: Uvula is midline and oropharynx is clear and moist. No oropharyngeal exudate.  Neck: Neck supple.  Cardiovascular: Normal rate and regular rhythm.   Pulmonary/Chest: Effort normal and breath sounds normal. She has no wheezes.  Breast exam normal  Abdominal: Soft. She exhibits no mass. There is no tenderness. There is no rebound and no guarding.  Genitourinary: Vagina normal and uterus normal. No breast swelling, tenderness, discharge or bleeding. There is no rash, tenderness or lesion on the right labia. There is no rash, tenderness or lesion on the left labia. Cervix exhibits no motion tenderness, no discharge and no friability. Right adnexum displays no mass, no tenderness and no fullness. Left adnexum displays no mass, no tenderness and no fullness.  Genitourinary Comments: (nurse Berenice assisted)  Lymphadenopathy:    She  has no cervical adenopathy.  Neurological: She is alert and oriented to person, place, and time.  Skin: Skin is warm and dry.  Psychiatric: She has a normal mood and affect. Her behavior is normal.  Nursing note and vitals reviewed.       Assessment & Plan:    Encounter Diagnoses  Name Primary?  . Pap smear for cervical cancer screening Yes  . Routine Papanicolaou smear   . Acute upper respiratory infection      -encouraged pt to rest, fluids, OTCs prn URI.   Gave handout -will call with PAP results -follow up one year.  RTO sooner prn

## 2016-09-14 NOTE — Patient Instructions (Addendum)
Infeccin del tracto respiratorio superior, adultos (Upper Respiratory Infection, Adult) La mayora de las infecciones del tracto respiratorio superior son infecciones virales de las vas que llevan el aire a los pulmones. Un infeccin del tracto respiratorio superior afecta la nariz, la garganta y las vas respiratorias superiores. El tipo ms frecuente de infeccin del tracto respiratorio superior es la nasofaringitis, que habitualmente se conoce como "resfro comn". Las infecciones del tracto respiratorio superior siguen su curso y por lo general se curan solas. En la mayora de los casos, la infeccin del tracto respiratorio superior no requiere atencin mdica, pero a veces, despus de una infeccin viral, puede surgir una infeccin bacteriana en las vas respiratorias superiores. Esto se conoce como infeccin secundaria. Las infecciones sinusales y en el odo medio son tipos frecuentes de infecciones secundarias en el tracto respiratorio superior. La neumona bacteriana tambin puede complicar un cuadro de infeccin del tracto respiratorio superior. Este tipo de infeccin puede empeorar el asma y la enfermedad pulmonar obstructiva crnica (EPOC). En algunos casos, estas complicaciones pueden requerir atencin mdica de emergencia y poner en peligro la vida. CAUSAS Casi todas las infecciones del tracto respiratorio superior se deben a los virus. Un virus es un tipo de microbio que puede contagiarse de una persona a otra. FACTORES DE RIESGO Puede estar en riesgo de sufrir una infeccin del tracto respiratorio superior si:  Fuma.  Tiene una enfermedad pulmonar o cardaca crnica.  Tiene debilitado el sistema de defensa (inmunitario) del cuerpo.  Es muy joven o de edad muy avanzada.  Tiene asma o alergias nasales.  Trabaja en reas donde hay mucha gente o poca ventilacin.  Trabaja en una escuela o en un centro de atencin mdica. SIGNOS Y SNTOMAS Habitualmente, los sntomas aparecen de  2a 3das despus de entrar en contacto con el virus del resfro. La mayora de las infecciones virales en el tracto respiratorio superior duran de 7a 10das. Sin embargo, las infecciones virales en el tracto respiratorio superior a causa del virus de la gripe pueden durar de 14a 18das y, habitualmente, son ms graves. Entre los sntomas se pueden incluir los siguientes:  Secrecin o congestin nasal.  Estornudos.  Tos.  Dolor de garganta.  Dolor de cabeza.  Fatiga.  Fiebre.  Prdida del apetito.  Dolor en la frente, detrs de los ojos y por encima de los pmulos (dolor sinusal).  Dolores musculares. DIAGNSTICO El mdico puede diagnosticar una infeccin del tracto respiratorio superior mediante los siguientes estudios:  Examen fsico.  Pruebas para verificar si los sntomas no se deben a otra afeccin, por ejemplo:  Faringitis estreptoccica.  Sinusitis.  Neumona.  Asma. TRATAMIENTO Esta infeccin desaparece sola, con el tiempo. No puede curarse con medicamentos, pero a menudo se prescriben para aliviar los sntomas. Los medicamentos pueden ser tiles para lo siguiente:  Bajar la fiebre.  Reducir la tos.  Aliviar la congestin nasal. INSTRUCCIONES PARA EL CUIDADO EN EL HOGAR  Tome los medicamentos solamente como se lo haya indicado el mdico.  A fin de aliviar el dolor de garganta, haga grgaras con solucin salina templada o consuma caramelos para la tos, como se lo haya indicado el mdico.  Use un humidificador de vapor clido o inhale el vapor de la ducha para aumentar la humedad del aire. Esto facilitar la respiracin.  Beba suficiente lquido para mantener la orina clara o de color amarillo plido.  Consuma sopas y otros caldos transparentes, y alimntese bien.  Descanse todo lo que sea necesario.  Regrese al trabajo cuando   la temperatura se le haya normalizado o cuando el mdico lo autorice. Es posible que deba quedarse en su casa durante un  tiempo prolongado, para no infectar a los dems. Tambin puede usar un barbijo y lavarse las manos con cuidado para evitar la propagacin del virus.  Aumente el uso del inhalador si tiene asma.  No consuma ningn producto que contenga tabaco, lo que incluye cigarrillos, tabaco de mascar o cigarrillos electrnicos. Si necesita ayuda para dejar de fumar, consulte al mdico. PREVENCIN La mejor manera de protegerse de un resfro es mantener una higiene adecuada.  Evite el contacto oral o fsico con personas que tengan sntomas de resfro.  En caso de contacto, lvese las manos con frecuencia. No hay pruebas claras de que la vitaminaC, la vitaminaE, la equincea o el ejercicio reduzcan la probabilidad de contraer un resfro. Sin embargo, siempre se recomienda descansar mucho, hacer ejercicio y alimentarse bien. SOLICITE ATENCIN MDICA SI:  Su estado empeora en lugar de mejorar.  Los medicamentos no logran controlar los sntomas.  Tiene escalofros.  La sensacin de falta de aire empeora.  Tiene mucosidad marrn o roja.  Tiene secrecin nasal amarilla o marrn.  Le duele la cara, especialmente al inclinarse hacia adelante.  Tiene fiebre.  Tiene los ganglios del cuello hinchados.  Siente dolor al tragar.  Tiene zonas blancas en la parte de atrs de la garganta. SOLICITE ATENCIN MDICA DE INMEDIATO SI:  Tiene sntomas intensos o persistentes de:  Dolor de cabeza.  Dolor de odos.  Dolor sinusal.  Dolor en el pecho.  Tiene enfermedad pulmonar crnica y cualquiera de estos sntomas:  Sibilancias.  Tos prolongada.  Tos con sangre.  Cambio en la mucosidad habitual.  Presenta rigidez en el cuello.  Tiene cambios en:  La visin.  La audicin.  El pensamiento.  El estado de nimo. ASEGRESE DE QUE:  Comprende estas instrucciones.  Controlar su afeccin.  Recibir ayuda de inmediato si no mejora o si empeora. Esta informacin no tiene como fin  reemplazar el consejo del mdico. Asegrese de hacerle al mdico cualquier pregunta que tenga. Document Released: 06/14/2005 Document Revised: 01/19/2015 Document Reviewed: 12/10/2013 Elsevier Interactive Patient Education  2017 [employer]  

## 2016-09-15 LAB — PAP, THINPREP RFLX HPV

## 2016-11-29 ENCOUNTER — Encounter: Payer: Self-pay | Admitting: Physician Assistant

## 2016-11-29 ENCOUNTER — Ambulatory Visit: Payer: Self-pay | Admitting: Physician Assistant

## 2016-11-29 VITALS — BP 102/72 | HR 72 | Temp 98.1°F | Ht 59.0 in | Wt 153.8 lb

## 2016-11-29 DIAGNOSIS — J029 Acute pharyngitis, unspecified: Secondary | ICD-10-CM

## 2016-11-29 LAB — POCT RAPID STREP A (OFFICE): Rapid Strep A Screen: NEGATIVE

## 2016-11-29 NOTE — Patient Instructions (Signed)
Faringitis (Pharyngitis) La faringitis ocurre cuando la faringe presenta enrojecimiento, dolor e hinchazn (inflamacin). CAUSAS Normalmente, la faringitis se debe a una infeccin. Generalmente, estas infecciones ocurren debido a virus (viral) y se presentan cuando las personas se resfran. Sin embargo, a veces la faringitis es provocada por bacterias (bacteriana). Las alergias tambin pueden ser una causa de la faringitis. La faringitis viral se puede contagiar de una persona a otra al toser, estornudar y compartir objetos o utensilios personales (tazas, tenedores, cucharas, cepillos de diente). La faringitis bacteriana se puede contagiar de una persona a otra a travs de un contacto ms ntimo, como besar. SIGNOS Y SNTOMAS Los sntomas de la faringitis incluyen los siguientes:  Dolor de garganta.  Cansancio (fatiga).  Fiebre no muy elevada.  Dolor de cabeza.  Dolores musculares y en las articulaciones.  Erupciones cutneas  Ganglios linfticos hinchados.  Una pelcula parecida a las placas en la garganta o las amgdalas (frecuente con la faringitis bacteriana). DIAGNSTICO El mdico le har preguntas sobre la enfermedad y sus sntomas. Normalmente, todo lo que se necesita para diagnosticar una faringitis son sus antecedentes mdicos y un examen fsico. A veces se realiza una prueba rpida para estreptococos. Tambin es posible que se realicen otros anlisis de laboratorio, segn la posible causa. TRATAMIENTO La faringitis viral normalmente mejorar en un plazo de 3 a 4das sin medicamentos. La faringitis bacteriana se trata con medicamentos que matan los grmenes (antibiticos). INSTRUCCIONES PARA EL CUIDADO EN EL HOGAR  Beba gran cantidad de lquido para mantener la orina de tono claro o color amarillo plido.  Tome solo medicamentos de venta libre o recetados, segn las indicaciones del mdico. ? Si le receta antibiticos, asegrese de terminarlos, incluso si comienza a sentirse  mejor. ? No tome aspirina.  Descanse lo suficiente.  Hgase grgaras con 8onzas (227ml) de agua con sal (cucharadita de sal por litro de agua) cada 1 o 2horas para calmar la garganta.  Puede usar pastillas (si no corre riesgo de ahogarse) o aerosoles para calmar la garganta.  SOLICITE ATENCIN MDICA SI:  Tiene bultos grandes y dolorosos en el cuello.  Tiene una erupcin cutnea.  Cuando tose elimina una expectoracin verde, amarillo amarronado o con sangre.  SOLICITE ATENCIN MDICA DE INMEDIATO SI:  El cuello se pone rgido.  Comienza a babear o no puede tragar lquidos.  Vomita o no puede retener los medicamentos ni los lquidos.  Siente un dolor intenso que no se alivia con los medicamentos recomendados.  Tiene dificultades para respirar (y no debido a la nariz tapada).  ASEGRESE DE QUE:  Comprende estas instrucciones.  Controlar su afeccin.  Recibir ayuda de inmediato si no mejora o si empeora.  Esta informacin no tiene como fin reemplazar el consejo del mdico. Asegrese de hacerle al mdico cualquier pregunta que tenga. Document Released: 06/14/2005 Document Revised: 06/25/2013 Document Reviewed: 05/12/2013 Elsevier Interactive Patient Education  2017 [employer] Inc.  

## 2016-11-29 NOTE — Progress Notes (Signed)
BP 102/72 (BP Location: Left Arm, Patient Position: Sitting, Cuff Size: Normal)   Pulse 72   Temp 98.1 F (36.7 C)   Ht 4\' 11"  (1.499 m)   Wt 153 lb 12 oz (69.7 kg)   SpO2 99%   BMI 31.05 kg/m    Subjective:    Patient ID: Alison Mendoza, female    DOB: 1981/07/24, 36 y.o.   MRN: [id number]  HPI: Alison Mendoza is a 36 y.o. female presenting on 11/29/2016 for Sore Throat (for a little over a week. pt states she has HA, chills at night, pressure on face, pulsing on left ear. pt states she previous had (subjective) fever on Monday. pt states taking Flanax and it is not helpful. pt states taking a type of aspirin sent from [place] (neumenogrina??) )   HPI Chief Complaint  Patient presents with  . Sore Throat    for a little over a week. pt states she has HA, chills at night, pressure on face, pulsing on left ear. pt states she previous had (subjective) fever on Monday. pt states taking Flanax and it is not helpful. pt states taking a type of aspirin sent from [place] (neumenogrina??)    Pt complains of  stuffy nose.  No cough.   One of her children is sick and coughing with nasal congestion.  No emesis.   Relevant past medical, surgical, family and social history reviewed and updated as indicated. Interim medical history since our last visit reviewed. Allergies and medications reviewed and updated.  No current outpatient prescriptions on file.   Review of Systems  Constitutional: Positive for chills, fatigue and fever (subjective). Negative for appetite change, diaphoresis and unexpected weight change.  HENT: Positive for congestion, sore throat and trouble swallowing. Negative for dental problem, drooling, ear pain, facial swelling, hearing loss, mouth sores, sneezing and voice change.   Eyes: Negative for pain, discharge, redness, itching and visual disturbance.  Respiratory: Negative for cough, choking, shortness of breath and wheezing.   Cardiovascular: Negative for chest  pain, palpitations and leg swelling.  Gastrointestinal: Negative for abdominal pain, blood in stool, constipation, diarrhea and vomiting.  Endocrine: Negative for cold intolerance, heat intolerance and polydipsia.  Genitourinary: Negative for decreased urine volume, dysuria and hematuria.  Musculoskeletal: Negative for arthralgias, back pain and gait problem.  Skin: Negative for rash.  Allergic/Immunologic: Negative for environmental allergies.  Neurological: Positive for headaches. Negative for seizures, syncope and light-headedness.  Hematological: Negative for adenopathy.  Psychiatric/Behavioral: Negative for agitation, dysphoric mood and suicidal ideas. The patient is not nervous/anxious.     Per HPI unless specifically indicated above     Objective:    BP 102/72 (BP Location: Left Arm, Patient Position: Sitting, Cuff Size: Normal)   Pulse 72   Temp 98.1 F (36.7 C)   Ht 4\' 11"  (1.499 m)   Wt 153 lb 12 oz (69.7 kg)   SpO2 99%   BMI 31.05 kg/m   Wt Readings from Last 3 Encounters:  11/29/16 153 lb 12 oz (69.7 kg)  09/14/16 156 lb (70.8 kg)  08/14/16 156 lb 8 oz (71 kg)    Physical Exam  Constitutional: She is oriented to person, place, and time. She appears well-developed and well-nourished.  HENT:  Head: Normocephalic and atraumatic.  Right Ear: Hearing, tympanic membrane, external ear and ear canal normal.  Left Ear: Hearing, tympanic membrane, external ear and ear canal normal.  Nose: Rhinorrhea present.  Mouth/Throat: Uvula is midline and oropharynx is clear and moist.  No oropharyngeal exudate.  Neck: Neck supple.  Cardiovascular: Normal rate and regular rhythm.   Pulmonary/Chest: Effort normal and breath sounds normal. She has no wheezes.  Lymphadenopathy:    She has no cervical adenopathy.  Neurological: She is alert and oriented to person, place, and time.  Skin: Skin is warm and dry.  Psychiatric: She has a normal mood and affect. Her behavior is normal.   Vitals reviewed.  RST -     Assessment & Plan:   Encounter Diagnosis  Name Primary?  . Sore throat Yes     -Counseled pt to gargle with warm salt water, rest, fluids.  IBU or APAP prn -follow up as scheduled.  RTO sooner prn

## 2017-09-20 ENCOUNTER — Ambulatory Visit: Payer: Self-pay | Admitting: Physician Assistant

## 2017-09-26 ENCOUNTER — Ambulatory Visit: Payer: Self-pay | Admitting: Physician Assistant

## 2017-09-27 ENCOUNTER — Encounter: Payer: Self-pay | Admitting: Physician Assistant

## 2017-10-11 ENCOUNTER — Encounter: Payer: Self-pay | Admitting: Physician Assistant

## 2017-10-11 ENCOUNTER — Ambulatory Visit: Payer: Self-pay | Admitting: Physician Assistant

## 2017-10-11 VITALS — BP 102/62 | HR 70 | Temp 98.1°F | Ht 59.0 in | Wt 164.0 lb

## 2017-10-11 DIAGNOSIS — Z0000 Encounter for general adult medical examination without abnormal findings: Secondary | ICD-10-CM

## 2017-10-11 DIAGNOSIS — S86912A Strain of unspecified muscle(s) and tendon(s) at lower leg level, left leg, initial encounter: Secondary | ICD-10-CM

## 2017-10-11 MED ORDER — NAPROXEN 500 MG PO TABS: ORAL_TABLET | ORAL | 0 refills | Status: DC

## 2017-10-11 NOTE — Progress Notes (Signed)
BP 102/62 (BP Location: Left Arm, Patient Position: Sitting, Cuff Size: Normal)   Pulse 70   Temp 98.1 F (36.7 C)   Ht 4\' 11"  (1.499 m)   Wt 164 lb (74.4 kg)   SpO2 99%   BMI 33.12 kg/m    Subjective:    Patient ID: Alison Mendoza, female    DOB: April 27, 1981, 37 y.o.   MRN: [id number]  HPI: Alison Mendoza is a 37 y.o. female presenting on 10/11/2017 for Follow-up   HPI   pt states she fell about 20 days ago and twisted her L ankle. pt states she feels 2 lumps on L side of head and believes it may be due to fall. pt states she is rubbing lumps with vaporub.   Recent URI mostly better now  Pt fell because she slid in mud.  She was on ground (not concrete or stairs).  She doesn't recall hitting her head.  She thins that the lumps on her head are getting smaller.  They do not hurt.   Relevant past medical, surgical, family and social history reviewed and updated as indicated. Interim medical history since our last visit reviewed. Allergies and medications reviewed and updated.  No current outpatient medications on file.   Review of Systems  Constitutional: Negative for appetite change, chills, diaphoresis, fatigue, fever and unexpected weight change.  HENT: Positive for sore throat. Negative for congestion, dental problem, drooling, ear pain, facial swelling, hearing loss, mouth sores, sneezing, trouble swallowing and voice change.   Eyes: Negative for pain, discharge, redness, itching and visual disturbance.  Respiratory: Negative for cough, choking, shortness of breath and wheezing.   Cardiovascular: Negative for chest pain, palpitations and leg swelling.  Gastrointestinal: Negative for abdominal pain, blood in stool, constipation, diarrhea and vomiting.  Endocrine: Negative for cold intolerance, heat intolerance and polydipsia.  Genitourinary: Negative for decreased urine volume, dysuria and hematuria.  Musculoskeletal: Negative for arthralgias, back pain and gait  problem.  Skin: Negative for rash.  Allergic/Immunologic: Negative for environmental allergies.  Neurological: Negative for seizures, syncope, light-headedness and headaches.  Hematological: Negative for adenopathy.  Psychiatric/Behavioral: Negative for agitation, dysphoric mood and suicidal ideas. The patient is not nervous/anxious.     Per HPI unless specifically indicated above     Objective:    BP 102/62 (BP Location: Left Arm, Patient Position: Sitting, Cuff Size: Normal)   Pulse 70   Temp 98.1 F (36.7 C)   Ht 4\' 11"  (1.499 m)   Wt 164 lb (74.4 kg)   SpO2 99%   BMI 33.12 kg/m   Wt Readings from Last 3 Encounters:  10/11/17 164 lb (74.4 kg)  11/29/16 153 lb 12 oz (69.7 kg)  09/14/16 156 lb (70.8 kg)    Physical Exam  Constitutional: She is oriented to person, place, and time. She appears well-developed and well-nourished.  HENT:  Head: Normocephalic and atraumatic.  Right Ear: Hearing, tympanic membrane, external ear and ear canal normal.  Left Ear: Hearing, tympanic membrane, external ear and ear canal normal.  Nose: Nose normal.  Mouth/Throat: Uvula is midline and oropharynx is clear and moist. No oropharyngeal exudate.  Neck: Neck supple.  Cardiovascular: Normal rate and regular rhythm.  Pulmonary/Chest: Effort normal and breath sounds normal. She has no wheezes.  Abdominal: Soft. Bowel sounds are normal. She exhibits no mass. There is no hepatosplenomegaly. There is no tenderness.  Musculoskeletal: She exhibits no edema.       Left ankle: Normal.  Left foot: There is tenderness. There is normal range of motion, no bony tenderness, no swelling, normal capillary refill, no crepitus, no deformity and no laceration.       Feet:  There is tenderness along the foot over the tendon.    Lymphadenopathy:    She has no cervical adenopathy.  Neurological: She is alert and oriented to person, place, and time.  Skin: Skin is warm and dry.  Psychiatric: She has a  normal mood and affect. Her behavior is normal.  Vitals reviewed.       Assessment & Plan:    Encounter Diagnoses  Name Primary?  . Well adult exam Yes  . Strain of tendon of left lower extremity, initial encounter     -for ankle discuss likely strain of tendon.  Encouraged to ice it 10-20 minutes 3-4 times daily, avoid overly tightening shoes, rx naproxen (LMP 2 wk ago).  Also to elevate the foot when seated.   -pt to RTO if it is not better in 1 month -discussed with pt that head bumps should resolve and go away over the next few weeks.  They feel like enlarged lymph nodes from recent URI and pt already can tell they are getting smaller -will schedule follow up in 1 year.  She is to RTO sooner prn as discussed

## 2018-07-27 ENCOUNTER — Encounter (HOSPITAL_COMMUNITY): Payer: Self-pay

## 2018-07-27 ENCOUNTER — Emergency Department (HOSPITAL_COMMUNITY)
Admission: EM | Admit: 2018-07-27 | Discharge: 2018-07-27 | Disposition: A | Discharge status: Home / Self Care | Payer: Self-pay | Attending: Emergency Medicine | Admitting: Emergency Medicine

## 2018-07-27 ENCOUNTER — Other Ambulatory Visit: Payer: Self-pay

## 2018-07-27 DIAGNOSIS — J020 Streptococcal pharyngitis: Secondary | ICD-10-CM | POA: Insufficient documentation

## 2018-07-27 LAB — GROUP A STREP BY PCR: Group A Strep by PCR: DETECTED — AB

## 2018-07-27 MED ORDER — BENZONATATE 100 MG PO CAPS: 100.0000 mg | ORAL_CAPSULE | Freq: Three times a day (TID) | ORAL | 0 refills | Status: DC

## 2018-07-27 MED ORDER — ACETAMINOPHEN 500 MG PO TABS
1000.0000 mg | ORAL_TABLET | Freq: Once | ORAL | Status: AC
  Administered 2018-07-27: 1000 mg via ORAL
  Filled 2018-07-27: qty 2

## 2018-07-27 MED ORDER — AMOXICILLIN 500 MG PO CAPS: 1000.0000 mg | ORAL_CAPSULE | Freq: Two times a day (BID) | ORAL | 0 refills | Status: DC

## 2018-07-27 MED ORDER — IBUPROFEN 800 MG PO TABS
800.0000 mg | ORAL_TABLET | Freq: Once | ORAL | Status: AC
  Administered 2018-07-27: 800 mg via ORAL
  Filled 2018-07-27: qty 1

## 2018-07-27 MED ORDER — ALBUTEROL SULFATE HFA 108 (90 BASE) MCG/ACT IN AERS
2.0000 | INHALATION_SPRAY | Freq: Once | RESPIRATORY_TRACT | Status: AC
  Administered 2018-07-27: 2 via RESPIRATORY_TRACT
  Filled 2018-07-27: qty 6.7
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
## 2018-07-27 NOTE — Discharge Instructions (Signed)
Take tylenol 2 pills 4 times a day and motrin 4 pills 3 times a day.  Drink plenty of fluids.  Return for worsening shortness of breath, headache, confusion. Follow up with your family doctor.   

## 2018-07-27 NOTE — ED Triage Notes (Signed)
Patient states she has a cough for the last 5 days.  States her stomach has started to hurt from all the coughing. No nausea or vomiting.  Afebrile but does endorse chills.  Also having a sore throat.

## 2018-09-13 ENCOUNTER — Emergency Department (HOSPITAL_COMMUNITY)
Admission: EM | Admit: 2018-09-13 | Discharge: 2018-09-13 | Disposition: A | Discharge status: Home / Self Care | Payer: Self-pay | Attending: Emergency Medicine | Admitting: Emergency Medicine

## 2018-09-13 ENCOUNTER — Encounter (HOSPITAL_COMMUNITY): Payer: Self-pay | Admitting: Emergency Medicine

## 2018-09-13 ENCOUNTER — Other Ambulatory Visit: Payer: Self-pay

## 2018-09-13 DIAGNOSIS — K0889 Other specified disorders of teeth and supporting structures: Secondary | ICD-10-CM | POA: Insufficient documentation

## 2018-09-13 DIAGNOSIS — Z79899 Other long term (current) drug therapy: Secondary | ICD-10-CM | POA: Insufficient documentation

## 2018-09-13 MED ORDER — HYDROCODONE-ACETAMINOPHEN 5-325 MG PO TABS
1.0000 | ORAL_TABLET | Freq: Once | ORAL | Status: AC
  Administered 2018-09-13: 1 via ORAL
  Filled 2018-09-13: qty 1

## 2018-09-13 MED ORDER — HYDROCODONE-ACETAMINOPHEN 5-325 MG PO TABS: 1.0000 | ORAL_TABLET | ORAL | 0 refills | Status: DC | PRN

## 2018-09-13 MED ORDER — HYDROCODONE-ACETAMINOPHEN 5-325 MG PO TABS: 2.0000 | ORAL_TABLET | ORAL | 0 refills | Status: DC | PRN

## 2018-09-13 NOTE — ED Triage Notes (Addendum)
Pt c/o dental pain x one week. Pt states she had bridgework done a week ago.

## 2018-09-13 NOTE — ED Notes (Signed)
Patient states warm liquids are causing her pain after having a dental procedure.

## 2018-09-13 NOTE — Discharge Instructions (Addendum)
Finish the antibiotics you are currently taking.  You may take the hydrocodone prescribed for pain relief.  This will make you drowsy - do not drive within 4 hours of taking this medication.  See the list of dentists provided and seek dental care if you pain persists.  It should stop hurting as your mouth heals from this bridgework procedure.
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
## 2018-09-16 MED FILL — Hydrocodone-Acetaminophen Tab 5-325 MG: ORAL | Qty: 6 | Status: AC

## 2018-10-10 ENCOUNTER — Ambulatory Visit: Payer: Self-pay | Admitting: Physician Assistant

## 2018-10-10 ENCOUNTER — Encounter: Payer: Self-pay | Admitting: Physician Assistant

## 2018-10-10 VITALS — BP 112/70 | HR 86 | Temp 98.1°F | Ht 59.0 in | Wt 165.0 lb

## 2018-10-10 DIAGNOSIS — Z0000 Encounter for general adult medical examination without abnormal findings: Secondary | ICD-10-CM

## 2018-10-10 DIAGNOSIS — J00 Acute nasopharyngitis [common cold]: Secondary | ICD-10-CM

## 2018-10-10 MED ORDER — PROMETHAZINE-DM 6.25-15 MG/5ML PO SYRP: ORAL_SOLUTION | ORAL | 0 refills | Status: DC

## 2018-10-10 NOTE — Progress Notes (Signed)
BP 112/70 (BP Location: Left Arm, Patient Position: Sitting, Cuff Size: Normal)   Pulse 86   Temp 98.1 F (36.7 C)   Ht 4\' 11"  (1.499 m)   Wt 165 lb (74.8 kg)   SpO2 98%   BMI 33.33 kg/m    Subjective:    Patient ID: Alison Mendoza, female    DOB: 06/05/1981, 38 y.o.   MRN: [id number]  HPI: Alison Mendoza is a 38 y.o. female presenting on 10/10/2018 for Follow-up; Diarrhea (and nausea/vomitting or 2 weeks. pt states she took a pregnancy test a week ago and was negative. pt states sx occurs after she eats. pt has taken pepto-bismol and alka seltzer with mineral water); and Cough (runny nose, congestion, R ear pain, HA, some sob. for past 3 days. pt states she has taken XL-3 which don't help.)   HPI   Pt is here for annual check up.  She has complaints of diarrhea and cough.    Chief Complaint  Patient presents with  . Diarrhea    and nausea/vomitting or 2 weeks. pt states she took a pregnancy test a week ago and was negative. pt states sx occurs after she eats. pt has taken pepto-bismol and alka seltzer with mineral water  . Cough    runny nose, congestion, R ear pain, HA, some sob. for past 3 days. pt states she has taken XL-3 which don't help.    Last emesis was yesterday.  She is only having one episode emesis daily.  She is nausea a lot.  She is eating.    LMP January 3.   She is using condoms for contraception  Pt works at a [employer].  It is cold there.    Pt vomits after she has a coughing fit.   Relevant past medical, surgical, family and social history reviewed and updated as indicated. Interim medical history since our last visit reviewed. Allergies and medications reviewed and updated.  No current outpatient medications on file.   Review of Systems  Constitutional: Positive for chills. Negative for appetite change, diaphoresis, fatigue, fever and unexpected weight change.  HENT: Positive for ear pain. Negative for congestion, dental problem,  drooling, facial swelling, hearing loss, mouth sores, sneezing, sore throat, trouble swallowing and voice change.   Eyes: Negative for pain, discharge, redness, itching and visual disturbance.  Respiratory: Positive for cough. Negative for choking, shortness of breath and wheezing.   Cardiovascular: Negative for chest pain, palpitations and leg swelling.  Gastrointestinal: Positive for diarrhea and vomiting. Negative for abdominal pain, blood in stool and constipation.  Endocrine: Negative for cold intolerance, heat intolerance and polydipsia.  Genitourinary: Negative for decreased urine volume, dysuria and hematuria.  Musculoskeletal: Negative for arthralgias, back pain and gait problem.  Skin: Negative for rash.  Allergic/Immunologic: Negative for environmental allergies.  Neurological: Positive for headaches. Negative for seizures, syncope and light-headedness.  Hematological: Negative for adenopathy.  Psychiatric/Behavioral: Negative for agitation, dysphoric mood and suicidal ideas. The patient is not nervous/anxious.     Per HPI unless specifically indicated above     Objective:    BP 112/70 (BP Location: Left Arm, Patient Position: Sitting, Cuff Size: Normal)   Pulse 86   Temp 98.1 F (36.7 C)   Ht 4\' 11"  (1.499 m)   Wt 165 lb (74.8 kg)   SpO2 98%   BMI 33.33 kg/m   Wt Readings from Last 3 Encounters:  10/10/18 165 lb (74.8 kg)  09/13/18 150 lb (68 kg)  10/11/17 164  lb (74.4 kg)    Physical Exam Vitals signs reviewed.  Constitutional:      Appearance: She is well-developed.  HENT:     Head: Normocephalic and atraumatic.     Right Ear: Hearing, tympanic membrane, ear canal and external ear normal.     Left Ear: Hearing, tympanic membrane, ear canal and external ear normal.     Nose: Congestion and rhinorrhea present.     Mouth/Throat:     Pharynx: Uvula midline. No oropharyngeal exudate.  Neck:     Musculoskeletal: Neck supple.  Cardiovascular:     Rate and  Rhythm: Normal rate and regular rhythm.  Pulmonary:     Effort: Pulmonary effort is normal.     Breath sounds: Normal breath sounds. No wheezing.  Abdominal:     General: Bowel sounds are normal.     Palpations: Abdomen is soft. There is no mass.     Tenderness: There is no abdominal tenderness.  Lymphadenopathy:     Cervical: No cervical adenopathy.  Skin:    General: Skin is warm and dry.  Neurological:     Mental Status: She is alert and oriented to person, place, and time.  Psychiatric:        Behavior: Behavior normal.          Assessment & Plan:    Encounter Diagnoses  Name Primary?  . Well adult exam Yes  . Acute nasopharyngitis      - counseled pt on OTCs to use as needed to help symptoms.  Rest. Fluids. Warm salt water gargles.  rx Promethazine dm to help with cough and nausea. Counseled pt to avoid driving on this medication due to sedation -OTCs prn -pt to follow up 1 year.  Will update PAP at that time.  She is to RTO sooner if needed.

## 2018-10-10 NOTE — Patient Instructions (Signed)
Infeccin de las vas respiratorias superiores, en adultos Upper Respiratory Infection, Adult Una infeccin de las vas respiratorias superiores (IVRS) es una infeccin viral comn de la [place], garganta y de las vas respiratorias superiores que conducen el aire a los pulmones. El tipo ms comn de IVRS es el resfro comn. Las IVRS generalmente mejoran solas, sin tratamiento mdico. Cules son las causas? La causa es un virus. Se puede contagiar este virus:  Al aspirar las gotitas que una persona infectada elimina al toser o Brewing technologist.  Al tocar algo que estuvo expuesto al virus (fue contaminado) y luego tocarse la boca, nariz u ojos. Qu incrementa el riesgo? Es ms propenso a Armed forces technical officer IVRS si:  Es muy pequeo o de edad muy [place].  Es el otoo o el invierno.  Tiene contacto cercano con otros, como en [place], escuela o centro de atencin mdica.  Fuma.  Tiene una enfermedad cardaca o pulmonar a largo plazo (crnica).  Tiene debilitado el sistema que combate las enfermedades (inmunitario).  Tiene asma o alergias nasales.  Est sufriendo mucho estrs.  Governor Rooks en una rea con mala circulacin de aire.  Tienen un dficit nutricional. Cules son los signos o los sntomas? La IVRS suele presentar alguno de los siguientes sntomas:  Secrecin o congestin nasal.  Estornudos.  Tos.  Dolor de Investment banker, operational.  Dolor de [place].  Fatiga.  Cristy Hilts.  Prdida del apetito.  Dolor en la frente, detrs de los ojos y por encima de los pmulos (dolor sinusal).  Dolores musculares.  Enrojecimiento o irritacin de los ojos.  Presin en los odos o la cara. Cmo se diagnostica? Esta afeccin se puede diagnosticar en funcin de los antecedentes mdicos, los sntomas y un examen fsico. El mdico puede usar un hisopo para tomar una muestra de mucosidad de la nariz (hisopado nasal). Esta muestra puede analizarse para determinar qu virus est provocando la  enfermedad. Cmo se trata? Las IVRS generalmente mejoran por s solas en un perodo de entre 7 y [place]. Puede tomar [employer] en su casa para aliviar los sntomas. Los medicamentos no curan las IVRS, Armed forces training and education officer el mdico puede recomendarle ciertos medicamentos para ayudar a [employer], como por ejemplo:  Medicamentos para la tos de [place].  Antitusivos. Toser es un tipo de defensa contra una infeccin que ayuda a limpiar el sistema respiratorio, de modo que tome estos medicamentos solo segn se lo recomiende el mdico.  Medicamentos para bajar la fiebre. Siga estas indicaciones en su casa: Actividad  Descanse todo lo que sea necesario.  Si tiene fiebre, qudese en su casa, es decir, no vaya al trabajo o la escuela, hasta que no tenga fiebre o hasta que el mdico le diga que ya no Financial trader. El mdico puede pedirle que use una mscara facial para evitar que disemine la infeccin. Para aliviar los sntomas  [employer] grgaras con una mezcla de agua y sal 3 o 4veces al da, o cuando sea necesario. Para preparar la mezcla de agua y sal, disuelva totalmente de media a 1cucharadita de sal en 1taza de agua tibia.  Use un humidificador de aire fro para agregar humedad al aire. Esto puede ayudarlo a que respire mejor. Qu debe comer y beber   Beba suficiente lquido como para mantener la orina de color amarillo plido.  Tome sopas y caldos transparentes. Instrucciones generales   [employer] de venta libre y los recetados solamente como se lo haya indicado el mdico. Estos incluyen medicamentos para el resfro, para  bajar la fiebre y antitusivos.  No consuma ningn producto que contenga nicotina o tabaco, como cigarrillos y Psychologist, sport and exercise. Si necesita ayuda para dejar de fumar, consulte al mdico.  Aljese del humo de otros fumadores.  Mantngase al da con todas las inmunizaciones, incluso la vacuna anual (una vez al ao) contra la gripe.  Concurra a todas las  visitas de seguimiento como se lo haya indicado el mdico. Esto es importante. Cmo evitar contagiar la infeccin a otros   Las IVRS se transmiten de Ardelia Mems persona a Alcus Dad (son contagiosas). Para evitar que la infeccin se propague, tome las siguientes medidas: ? Lvese las manos frecuentemente con agua y [place]. Use desinfectante para manos si no dispone de [place] y [place]. ? Evite tocarse la boca, la cara, los ojos o la [place]. ? Tosa o estornude en un pauelo de papel o en su manga o codo en lugar de hacerlo en la mano o en el aire. Comunquese con un mdico si:  Empeora en lugar de mejorar.  Tiene fiebre o siente escalofros.  Tiene mucosidad marrn o roja.  Tiene una secrecin amarilla o marrn de la Lawyer.  Le duele la cara, especialmente al inclinarse hacia adelante.  Tiene los ganglios del cuello hinchados.  Siente dolor al tragar.  Tiene zonas blancas en la parte de atrs de la garganta. Solicite ayuda de inmediato si:  La falta de aire empeora.  Tiene sntomas intensos o persistentes de: ? Dolor de [place]. ? Dolor de odo. ? Dolor sinusal. ? Tourist information centre manager.  Tiene enfermedad pulmonar crnica junto con cualquiera de estos sntomas: ? Sibilancias. ? Tos prolongada. ? Tos con Carma Lair. ? Cambio en la mucosidad habitual.  Presenta rigidez en el cuello.  Tiene cambios en: ? La visin. ? La audicin. ? El razonamiento. ? El [place] de nimo. Resumen  Una infeccin de las vas respiratorias superiores (IVRS) es una infeccin comn de la nariz, garganta y de las vas respiratorias superiores que conducen el aire a los pulmones.  La causa es un virus.  Las IVRS generalmente mejoran por s solas en un perodo de entre 7 y [place].  Los medicamentos no curan las IVRS, pero el mdico puede recomendarle ciertos medicamentos para ayudar a [employer]. Esta informacin no tiene Marine scientist el consejo del mdico. Asegrese de hacerle al mdico cualquier  pregunta que tenga. Document Released: 06/14/2005 Document Revised: 07/06/2017 Document Reviewed: 07/06/2017 Elsevier Interactive Patient Education  2019 [employer].

## 2018-12-24 ENCOUNTER — Emergency Department (HOSPITAL_COMMUNITY)
Admission: EM | Admit: 2018-12-24 | Discharge: 2018-12-24 | Disposition: A | Discharge status: Home / Self Care | Payer: Self-pay | Attending: Emergency Medicine | Admitting: Emergency Medicine

## 2018-12-24 ENCOUNTER — Encounter (HOSPITAL_COMMUNITY): Payer: Self-pay

## 2018-12-24 ENCOUNTER — Other Ambulatory Visit: Payer: Self-pay

## 2018-12-24 DIAGNOSIS — K0889 Other specified disorders of teeth and supporting structures: Secondary | ICD-10-CM | POA: Insufficient documentation

## 2018-12-24 MED ORDER — PENICILLIN V POTASSIUM 500 MG PO TABS: 500.0000 mg | ORAL_TABLET | Freq: Four times a day (QID) | ORAL | 0 refills | Status: AC

## 2018-12-24 MED ORDER — OXYCODONE-ACETAMINOPHEN 5-325 MG PO TABS
1.0000 | ORAL_TABLET | Freq: Once | ORAL | Status: AC
  Administered 2018-12-24: 14:00:00 1 via ORAL
  Filled 2018-12-24: qty 1

## 2018-12-24 NOTE — ED Triage Notes (Signed)
Pt arrives POV from home for upper dental pain since Sunday. Pt attempted ibuprofen at home with no success. Pt speaks spanish, interpreter at bedside.

## 2018-12-24 NOTE — Discharge Instructions (Addendum)
Please read attached information. If you experience any new or worsening signs or symptoms please return to the emergency room for evaluation. Please follow-up with your primary care provider or specialist as discussed. Please use medication prescribed only as directed and discontinue taking if you have any concerning signs or symptoms.   °
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
## 2018-12-24 NOTE — ED Notes (Signed)
Patient verbalizes understanding of discharge instructions. Opportunity for questioning and answers were provided. Armband removed by staff, pt discharged from ED. Pt ambulatory to lobby. Prescriptions and follow up care reviewed. Interpreter used.

## 2018-12-24 NOTE — ED Notes (Signed)
ED Provider at bedside. 

## 2019-02-02 ENCOUNTER — Emergency Department (HOSPITAL_COMMUNITY)
Admission: EM | Admit: 2019-02-02 | Discharge: 2019-02-02 | Disposition: A | Discharge status: Home / Self Care | Payer: Self-pay | Attending: Emergency Medicine | Admitting: Emergency Medicine

## 2019-02-02 ENCOUNTER — Encounter (HOSPITAL_COMMUNITY): Payer: Self-pay | Admitting: Emergency Medicine

## 2019-02-02 ENCOUNTER — Emergency Department (HOSPITAL_COMMUNITY): Payer: Self-pay

## 2019-02-02 ENCOUNTER — Other Ambulatory Visit: Payer: Self-pay

## 2019-02-02 DIAGNOSIS — R197 Diarrhea, unspecified: Secondary | ICD-10-CM | POA: Insufficient documentation

## 2019-02-02 DIAGNOSIS — R1031 Right lower quadrant pain: Secondary | ICD-10-CM | POA: Insufficient documentation

## 2019-02-02 DIAGNOSIS — R102 Pelvic and perineal pain: Secondary | ICD-10-CM | POA: Insufficient documentation

## 2019-02-02 DIAGNOSIS — R112 Nausea with vomiting, unspecified: Secondary | ICD-10-CM | POA: Insufficient documentation

## 2019-02-02 LAB — URINALYSIS, ROUTINE W REFLEX MICROSCOPIC
Bilirubin Urine: NEGATIVE
Glucose, UA: NEGATIVE mg/dL
Hgb urine dipstick: NEGATIVE
Ketones, ur: NEGATIVE mg/dL
Leukocytes,Ua: NEGATIVE
Nitrite: NEGATIVE
Protein, ur: NEGATIVE mg/dL
Specific Gravity, Urine: 1.042 — ABNORMAL HIGH (ref 1.005–1.030)
pH: 6 (ref 5.0–8.0)

## 2019-02-02 LAB — COMPREHENSIVE METABOLIC PANEL
ALT: 27 U/L (ref 0–44)
AST: 23 U/L (ref 15–41)
Albumin: 3.7 g/dL (ref 3.5–5.0)
Alkaline Phosphatase: 98 U/L (ref 38–126)
Anion gap: 10 (ref 5–15)
BUN: 10 mg/dL (ref 6–20)
CO2: 23 mmol/L (ref 22–32)
Calcium: 9 mg/dL (ref 8.9–10.3)
Chloride: 105 mmol/L (ref 98–111)
Creatinine, Ser: 0.8 mg/dL (ref 0.44–1.00)
GFR calc Af Amer: >60 mL/min (ref >60)
GFR calc non Af Amer: >60 mL/min (ref >60)
Glucose, Bld: 111 mg/dL — ABNORMAL HIGH (ref 70–99)
Potassium: 3.9 mmol/L (ref 3.5–5.1)
Sodium: 138 mmol/L (ref 135–145)
Total Bilirubin: 0.6 mg/dL (ref 0.3–1.2)
Total Protein: 7 g/dL (ref 6.5–8.1)

## 2019-02-02 LAB — CBC
HCT: 39.3 % (ref 36.0–46.0)
Hemoglobin: 12.8 g/dL (ref 12.0–15.0)
MCH: 28.7 pg (ref 26.0–34.0)
MCHC: 32.6 g/dL (ref 30.0–36.0)
MCV: 88.1 fL (ref 80.0–100.0)
Platelets: 225 10*3/uL (ref 150–400)
RBC: 4.46 MIL/uL (ref 3.87–5.11)
RDW: 14.2 % (ref 11.5–15.5)
WBC: 11.6 10*3/uL — ABNORMAL HIGH (ref 4.0–10.5)
nRBC: 0 % (ref 0.0–0.2)

## 2019-02-02 LAB — WET PREP, GENITAL
Clue Cells Wet Prep HPF POC: NONE SEEN
Sperm: NONE SEEN
Trich, Wet Prep: NONE SEEN
Yeast Wet Prep HPF POC: NONE SEEN

## 2019-02-02 LAB — I-STAT BETA HCG BLOOD, ED (MC, WL, AP ONLY): I-stat hCG, quantitative: <5 m[IU]/mL (ref <5)

## 2019-02-02 LAB — LIPASE, BLOOD: Lipase: 40 U/L (ref 11–51)

## 2019-02-02 MED ORDER — SODIUM CHLORIDE 0.9% FLUSH
3.0000 mL | Freq: Once | INTRAVENOUS | Status: AC
  Administered 2019-02-02: 3 mL via INTRAVENOUS

## 2019-02-02 MED ORDER — SODIUM CHLORIDE 0.9 % IV SOLN
INTRAVENOUS | Status: DC
  Administered 2019-02-02: 05:00:00 via INTRAVENOUS

## 2019-02-02 MED ORDER — PROMETHAZINE HCL 25 MG/ML IJ SOLN
25.0000 mg | Freq: Once | INTRAMUSCULAR | Status: AC
  Administered 2019-02-02: 25 mg via INTRAVENOUS
  Filled 2019-02-02: qty 1

## 2019-02-02 MED ORDER — MORPHINE SULFATE (PF) 4 MG/ML IV SOLN
4.0000 mg | Freq: Once | INTRAVENOUS | Status: AC
  Administered 2019-02-02: 05:00:00 4 mg via INTRAVENOUS
  Filled 2019-02-02: qty 1

## 2019-02-02 MED ORDER — IOHEXOL 300 MG/ML  SOLN
100.0000 mL | Freq: Once | INTRAMUSCULAR | Status: AC | PRN
  Administered 2019-02-02: 100 mL via INTRAVENOUS

## 2019-02-02 MED ORDER — MORPHINE SULFATE (PF) 4 MG/ML IV SOLN
4.0000 mg | Freq: Once | INTRAVENOUS | Status: AC
  Administered 2019-02-02: 04:00:00 4 mg via INTRAVENOUS
  Filled 2019-02-02: qty 1

## 2019-02-02 MED ORDER — ONDANSETRON HCL 4 MG/2ML IJ SOLN
4.0000 mg | Freq: Once | INTRAMUSCULAR | Status: AC
  Administered 2019-02-02: 4 mg via INTRAVENOUS
  Filled 2019-02-02: qty 2

## 2019-02-02 MED ORDER — ONDANSETRON HCL 4 MG PO TABS: 4.0000 mg | ORAL_TABLET | Freq: Four times a day (QID) | ORAL | 0 refills | Status: DC

## 2019-02-02 MED ORDER — DICYCLOMINE HCL 20 MG PO TABS: 20.0000 mg | ORAL_TABLET | Freq: Two times a day (BID) | ORAL | 0 refills | Status: DC

## 2019-02-02 NOTE — ED Triage Notes (Signed)
Pt c/o right side lower abd pain for the past 2 days with nausea and vomiting getting worse tonight, pt denies any urinary symptoms. Spanish speaker only.

## 2019-02-02 NOTE — ED Provider Notes (Signed)
TIME SEEN: 2:42 AM  CHIEF COMPLAINT: Abdominal pain  HPI: Patient is a 38 year old female with history of previous C-sections who presents to the emergency department with complaints of abdominal pain for the past couple of days.  She describes a sharp, severe in the right lower quadrant.  Has had nausea and vomiting today.  Did have diarrhea for 3 days but this has improved.  No fevers but reports chills.  No cough, chest pain or shortness of breath.  No vaginal bleeding or discharge.  No dysuria or hematuria.  No aggravating or alleviating factors.  Spanish interpreter used..  ROS: See HPI Constitutional: no fever; + chills Eyes: no drainage  ENT: no runny nose   Cardiovascular:  no chest pain  Resp: no SOB  GI: + Vomiting and diarrhea GU: no dysuria Integumentary: no rash  Allergy: no hives  Musculoskeletal: no leg swelling  Neurological: no slurred speech ROS otherwise negative  PAST MEDICAL HISTORY/PAST SURGICAL HISTORY:  Past Medical History:  Diagnosis Date  . Abnormal Pap smear   . Headache(784.0)   . History of worms    3 yrs ago  . Salmonella 2006    MEDICATIONS:  Prior to Admission medications   Medication Sig Start Date End Date Taking? Authorizing Provider  promethazine-dextromethorphan (PROMETHAZINE-DM) 6.25-15 MG/5ML syrup 1 teaspoon (5cc) every 6 hours as needed for cough.   1 cucharadita cada 6 horas cuando sea necesario para la toz 10/10/18   Soyla Dryer, PA-C    ALLERGIES:  No Known Allergies  SOCIAL HISTORY:  Social History   Tobacco Use  . Smoking status: Never Smoker  . Smokeless tobacco: Never Used  Substance Use Topics  . Alcohol use: Yes    Comment: ocassionally drinks beer    FAMILY HISTORY: Family History  Problem Relation Age of Onset  . Diabetes Sister   . Lung disease Mother   . Diabetes Paternal Aunt     EXAM: BP 114/60 (BP Location: Right Arm)   Pulse 84   Temp 98.3 F (36.8 C) (Oral)   Resp 18   Ht 5\' 2"  (1.575 m)    Wt 72.6 kg   LMP 01/13/2019   SpO2 99%   BMI 29.26 kg/m  CONSTITUTIONAL: Alert and oriented and responds appropriately to questions. Well-appearing; well-nourished HEAD: Normocephalic EYES: Conjunctivae clear, pupils appear equal, EOMI ENT: normal nose; moist mucous membranes NECK: Supple, no meningismus, no nuchal rigidity, no LAD  CARD: RRR; S1 and S2 appreciated; no murmurs, no clicks, no rubs, no gallops RESP: Normal chest excursion without splinting or tachypnea; breath sounds clear and equal bilaterally; no wheezes, no rhonchi, no rales, no hypoxia or respiratory distress, speaking full sentences ABD/GI: Normal bowel sounds; non-distended; soft, there to palpation throughout the entire right abdomen but more so in the right lower quadrant at McBurney's point without guarding or rebound, negative Murphy sign, no peritoneal signs, no pelvic tenderness GU:  Normal external genitalia. No lesions, rashes noted. Patient has no vaginal bleeding on exam. No significant vaginal discharge.  Patient has some right adnexal tenderness on exam but no mass or fullness.  No left adnexal tenderness, fullness or mass.  No cervical motion tenderness.  Cervix is not appear friable.  Cervix is closed.  Chaperone present for exam. BACK:  The back appears normal and is non-tender to palpation, there is no CVA tenderness EXT: Normal ROM in all joints; non-tender to palpation; no edema; normal capillary refill; no cyanosis, no calf tenderness or swelling  SKIN: Normal color for age and race; warm; no rash NEURO: Moves all extremities equally PSYCH: The patient's mood and manner are appropriate. Grooming and personal hygiene are appropriate.  MEDICAL DECISION MAKING: Patient here with pain at McBurney's point.  Reports chills, nausea, vomiting and diarrhea.  Differential includes appendicitis, colitis, diverticulitis, bowel obstruction, gastroenteritis, UTI, pyelonephritis, kidney stone.  Less likely TOA,  ectopic, ovarian cyst.  Will obtain labs, urine and perform a CT of the abdomen pelvis.  Will give IV fluids, pain and nausea medicine.  She states her husband drove her to the emergency department.  ED PROGRESS: Patient CT scan shows no acute abnormality.  Appendix appears normal.  She still points to her pelvic region as a source of her pain.  Pelvic exam performed with some adnexal tenderness.  Will obtain transvaginal ultrasound with Doppler to rule out torsion.  No cervical motion tenderness, discharge on exam.  Doubt PID, cervicitis.  She is sexually active with her husband.  Pregnancy test today is negative.  Urinalysis pending.  Labs unremarkable other than white count of 11.6.   7:05 AM  Pt's ultrasound pending.  Wet prep unremarkable.  Signed out to oncoming ED physician to follow-up on imaging.  If imaging does not show acute abnormality, patient will be discharged home with prescriptions for Bentyl, Zofran, Imodium for possible viral gastroenteritis.  I do not think that patient has coronavirus.   I reviewed all nursing notes, vitals, pertinent previous records, EKGs, lab and urine results, imaging (as available).   EKG Interpretation  Date/Time:  Sunday Feb 02 2019 02:34:46 EDT Ventricular Rate:  71 PR Interval:    QRS Duration: 99 QT Interval:  405 QTC Calculation: 441 R Axis:   78 Text Interpretation:  Age not entered, assumed to be  38 years old for purpose of ECG interpretation Sinus rhythm No old tracing to compare Confirmed by Ward, Cyril Mourning [phone]) on 02/02/2019 2:42:02 AM           Ward, Delice Bison, DO 02/02/19 4801

## 2019-02-02 NOTE — ED Notes (Signed)
Patient transported to Ultrasound 

## 2019-02-02 NOTE — Discharge Instructions (Addendum)
CT scan and ultrasound of the abdomen without any acute findings.  Take medications as directed.  Follow-up with your doctor.

## 2019-02-02 NOTE — ED Notes (Signed)
Patient transported to CT 

## 2019-02-02 NOTE — ED Provider Notes (Signed)
Patient's ultrasound without any acute findings.  Also CT scan without acute findings.  Patient stable for discharge home.   Fredia Sorrow, MD 02/02/19 [phone]

## 2019-02-02 NOTE — ED Notes (Signed)
Pt returned to room  

## 2019-02-03 LAB — URINE CULTURE

## 2019-02-03 LAB — GC/CHLAMYDIA PROBE AMP (~~LOC~~) NOT AT ARMC
Chlamydia: NEGATIVE
Neisseria Gonorrhea: NEGATIVE

## 2019-04-02 ENCOUNTER — Telehealth: Payer: Self-pay | Admitting: Student

## 2019-04-02 ENCOUNTER — Other Ambulatory Visit: Payer: Self-pay

## 2019-04-02 ENCOUNTER — Encounter: Payer: Self-pay | Admitting: Physician Assistant

## 2019-04-02 ENCOUNTER — Ambulatory Visit: Payer: Self-pay | Admitting: Physician Assistant

## 2019-04-02 DIAGNOSIS — Z20822 Contact with and (suspected) exposure to covid-19: Secondary | ICD-10-CM

## 2019-04-02 DIAGNOSIS — R112 Nausea with vomiting, unspecified: Secondary | ICD-10-CM

## 2019-04-02 DIAGNOSIS — R109 Unspecified abdominal pain: Secondary | ICD-10-CM

## 2019-04-02 DIAGNOSIS — R5383 Other fatigue: Secondary | ICD-10-CM

## 2019-04-02 DIAGNOSIS — R61 Generalized hyperhidrosis: Secondary | ICD-10-CM

## 2019-04-02 DIAGNOSIS — R6883 Chills (without fever): Secondary | ICD-10-CM

## 2019-04-02 MED ORDER — PROMETHAZINE HCL 25 MG PO TABS: ORAL_TABLET | ORAL | 0 refills | Status: DC

## 2019-04-02 MED ORDER — OMEPRAZOLE 40 MG PO CPDR: DELAYED_RELEASE_CAPSULE | ORAL | 1 refills | Status: DC

## 2019-04-02 NOTE — Telephone Encounter (Signed)
-----   Message from Soyla Dryer, [place] sent at 04/02/2019 12:52 PM EDT -----  ----- Message ----- From: Addison Naegeli, RN Sent: 04/02/2019  12:43 PM EDT To: Soyla Dryer, PA-C  Hello Larene Beach, as a 04/02/2019 an appointment is not required for testing. Please contact the pt and inform her that she can go to any testing site. Also as the provider, please place the order. Thank You. ----- Message ----- From: Soyla Dryer, PA-C Sent: 04/02/2019  12:11 PM EDT To: [facility]  Ms Artist Pais needs to be tested for covid 19 due to night sweats, subjective fever, fatigue, nausea, vomiting.   She only speaks spanish.  She can be reached by phone at [phone]  Thank you

## 2019-04-02 NOTE — Progress Notes (Signed)
There were no vitals taken for this visit.   Subjective:    Patient ID: Alison Mendoza, female    DOB: 09-Dec-1980, 38 y.o.   MRN: [id number]  HPI: Alison Mendoza is a 38 y.o. female presenting on 04/02/2019 for Nausea (for about 3 months. pt c/o nausea, vomitting, fatigue and abd pain. pt has drunk teas to help.)   HPI    There is a telemedicine visit through [employer] due to coronavirus pandemic  I connected with  Alison Mendoza on 04/02/19 by a video enabled telemedicine application and verified that I am speaking with the correct person using two identifiers.   I discussed the limitations of evaluation and management by telemedicine. The patient expressed understanding and agreed to proceed.   Pt is at work (she works as a IT consultant).  Provider is at office.    Pt not feeling well  Pt with same symptoms as when seen in ER in may-  CT normal, [place] normal  Pt is vomiting.    Yesterday after eating.  She ate twice and threw up once.  No emesis today.  She ate breakfast.    Pt is at work now.  She works doing Herbalist.     No changes in symptoms past week.    She says she gets chills sometimes.    She is taking some pills for nausea- fermamine  (?dramamine?)- it's something OTC from [facility].  LMP June 22.  She is using condoms.  No pain with intercourse.   No vaginal symptoms  Some R sided abd pain.   Pain worse after eating.    Sometimes feels bloated.    She says she got meds given by ER in may and it helped for a couple of days and then it didn't work any more.   BMs normal.      abd pain and nausea constant /always present although sometimes worse and sometimes better.    She does report having night sweats and subjective fever.    No blood in emesis or PR.   She is  fatigued      Relevant past medical, surgical, family and social history reviewed and updated as indicated. Interim medical history since our last visit reviewed. Allergies  and medications reviewed and updated.   Current Outpatient Medications:  .  Acetaminophen (TYLENOL PO), Take 1 tablet by mouth daily., Disp: , Rfl:    Review of Systems  Per HPI unless specifically indicated above     Objective:    There were no vitals taken for this visit.  Wt Readings from Last 3 Encounters:  02/02/19 160 lb (72.6 kg)  10/10/18 165 lb (74.8 kg)  09/13/18 150 lb (68 kg)    Physical Exam Constitutional:      General: She is not in acute distress.    Appearance: Normal appearance. She is not toxic-appearing.  HENT:     Head: Normocephalic and atraumatic.  Pulmonary:     Effort: Pulmonary effort is normal. No respiratory distress.  Abdominal:     General: There is no distension.  Neurological:     Mental Status: She is alert and oriented to person, place, and time.  Psychiatric:        Attention and Perception: Attention normal.        Speech: Speech normal.        Behavior: Behavior is cooperative.         Assessment & Plan:   Encounter Diagnoses  Name Primary?  Marland Kitchen  Nausea and vomiting, intractability of vomiting not specified, unspecified vomiting type Yes  . Night sweats   . Fatigue, unspecified type   . Chills   . Abdominal pain, unspecified abdominal location       -rx phenergan and omeprazole.  She is reminded to avoid driving on phenergan due to sedation.  She is to avoid spicy and greasy foods -will refer for covid 19 test.  She is counseled to self-isolate until she gets her results -pt to follow up 1 month.  She is to contact office sooner prn worsneing or new symptoms

## 2019-04-02 NOTE — Progress Notes (Signed)
lab

## 2019-04-06 LAB — NOVEL CORONAVIRUS, NAA: SARS-CoV-2, NAA: NOT DETECTED

## 2019-04-19 ENCOUNTER — Emergency Department (HOSPITAL_COMMUNITY)
Admission: EM | Admit: 2019-04-19 | Discharge: 2019-04-20 | Disposition: A | Discharge status: Home / Self Care | Payer: Self-pay | Attending: Emergency Medicine | Admitting: Emergency Medicine

## 2019-04-19 ENCOUNTER — Encounter (HOSPITAL_COMMUNITY): Payer: Self-pay | Admitting: Emergency Medicine

## 2019-04-19 ENCOUNTER — Other Ambulatory Visit: Payer: Self-pay

## 2019-04-19 DIAGNOSIS — R109 Unspecified abdominal pain: Secondary | ICD-10-CM | POA: Insufficient documentation

## 2019-04-19 DIAGNOSIS — R531 Weakness: Secondary | ICD-10-CM | POA: Insufficient documentation

## 2019-04-19 DIAGNOSIS — Z79899 Other long term (current) drug therapy: Secondary | ICD-10-CM | POA: Insufficient documentation

## 2019-04-19 DIAGNOSIS — R197 Diarrhea, unspecified: Secondary | ICD-10-CM | POA: Insufficient documentation

## 2019-04-19 DIAGNOSIS — R112 Nausea with vomiting, unspecified: Secondary | ICD-10-CM | POA: Insufficient documentation

## 2019-04-19 LAB — I-STAT BETA HCG BLOOD, ED (MC, WL, AP ONLY): I-stat hCG, quantitative: <5 m[IU]/mL (ref <5)

## 2019-04-19 LAB — URINALYSIS, ROUTINE W REFLEX MICROSCOPIC
Bilirubin Urine: NEGATIVE
Glucose, UA: NEGATIVE mg/dL
Hgb urine dipstick: NEGATIVE
Ketones, ur: NEGATIVE mg/dL
Leukocytes,Ua: NEGATIVE
Nitrite: NEGATIVE
Protein, ur: NEGATIVE mg/dL
Specific Gravity, Urine: 1.024 (ref 1.005–1.030)
pH: 6 (ref 5.0–8.0)

## 2019-04-19 LAB — CBC
HCT: 40.5 % (ref 36.0–46.0)
Hemoglobin: 13 g/dL (ref 12.0–15.0)
MCH: 28.8 pg (ref 26.0–34.0)
MCHC: 32.1 g/dL (ref 30.0–36.0)
MCV: 89.6 fL (ref 80.0–100.0)
Platelets: 213 10*3/uL (ref 150–400)
RBC: 4.52 MIL/uL (ref 3.87–5.11)
RDW: 13.4 % (ref 11.5–15.5)
WBC: 10.7 10*3/uL — ABNORMAL HIGH (ref 4.0–10.5)
nRBC: 0 % (ref 0.0–0.2)

## 2019-04-19 NOTE — ED Triage Notes (Signed)
Patient reports chronic right abdominal pain for 4 months with intermittent emesis /diarrhea and fatigue , denies fever or chills .

## 2019-04-20 LAB — COMPREHENSIVE METABOLIC PANEL
ALT: 23 U/L (ref 0–44)
AST: 18 U/L (ref 15–41)
Albumin: 3.7 g/dL (ref 3.5–5.0)
Alkaline Phosphatase: 98 U/L (ref 38–126)
Anion gap: 10 (ref 5–15)
BUN: 10 mg/dL (ref 6–20)
CO2: 22 mmol/L (ref 22–32)
Calcium: 9 mg/dL (ref 8.9–10.3)
Chloride: 107 mmol/L (ref 98–111)
Creatinine, Ser: 0.75 mg/dL (ref 0.44–1.00)
GFR calc Af Amer: >60 mL/min (ref >60)
GFR calc non Af Amer: >60 mL/min (ref >60)
Glucose, Bld: 125 mg/dL — ABNORMAL HIGH (ref 70–99)
Potassium: 3.7 mmol/L (ref 3.5–5.1)
Sodium: 139 mmol/L (ref 135–145)
Total Bilirubin: 0.4 mg/dL (ref 0.3–1.2)
Total Protein: 7 g/dL (ref 6.5–8.1)

## 2019-04-20 LAB — LIPASE, BLOOD: Lipase: 39 U/L (ref 11–51)

## 2019-04-20 MED ORDER — METOCLOPRAMIDE HCL 10 MG PO TABS: 10.0000 mg | ORAL_TABLET | Freq: Four times a day (QID) | ORAL | 0 refills | Status: DC | PRN

## 2019-04-20 MED ORDER — PREDNISONE 20 MG PO TABS: 60.0000 mg | ORAL_TABLET | Freq: Every day | ORAL | 0 refills | Status: DC

## 2019-04-20 MED ORDER — METOCLOPRAMIDE HCL 5 MG/ML IJ SOLN
10.0000 mg | Freq: Once | INTRAMUSCULAR | Status: AC
  Administered 2019-04-20: 02:00:00 10 mg via INTRAMUSCULAR
  Filled 2019-04-20: qty 2

## 2019-04-20 MED ORDER — METHYLPREDNISOLONE SODIUM SUCC 125 MG IJ SOLR
60.0000 mg | Freq: Once | INTRAMUSCULAR | Status: AC
  Administered 2019-04-20: 02:00:00 60 mg via INTRAMUSCULAR
  Filled 2019-04-20: qty 2

## 2019-04-20 NOTE — Discharge Instructions (Addendum)
A pesar de lo mal que te sientas, tus anlisis de sangre son normales. Sospecho que puede tener enfermedad inflamatoria intestinal, pero ese diagnstico debe ser realizado por Radiation protection practitioner. Por favor, haga un seguimiento con Materials engineer. Regrese si los sntomas empeoran.

## 2019-04-20 NOTE — ED Notes (Signed)
ED Provider at bedside. 

## 2019-04-20 NOTE — ED Notes (Signed)
Discharge instructions discussed with pt. Pt verbalized understanding. Pt to follow up with gastro.
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
## 2019-04-21 ENCOUNTER — Encounter: Payer: Self-pay | Admitting: Internal Medicine

## 2019-05-06 ENCOUNTER — Ambulatory Visit: Payer: Self-pay | Admitting: Physician Assistant

## 2019-05-06 ENCOUNTER — Encounter: Payer: Self-pay | Admitting: Physician Assistant

## 2019-05-06 ENCOUNTER — Encounter: Payer: Self-pay | Admitting: Internal Medicine

## 2019-05-06 ENCOUNTER — Ambulatory Visit: Payer: Self-pay | Admitting: Internal Medicine

## 2019-05-06 ENCOUNTER — Other Ambulatory Visit: Payer: Self-pay

## 2019-05-06 VITALS — BP 110/70 | HR 76 | Temp 98.0°F | Ht 59.0 in | Wt 171.0 lb

## 2019-05-06 DIAGNOSIS — R197 Diarrhea, unspecified: Secondary | ICD-10-CM

## 2019-05-06 DIAGNOSIS — R1031 Right lower quadrant pain: Secondary | ICD-10-CM

## 2019-05-06 DIAGNOSIS — Z789 Other specified health status: Secondary | ICD-10-CM

## 2019-05-06 DIAGNOSIS — R112 Nausea with vomiting, unspecified: Secondary | ICD-10-CM

## 2019-05-06 DIAGNOSIS — G8929 Other chronic pain: Secondary | ICD-10-CM

## 2019-05-06 DIAGNOSIS — R109 Unspecified abdominal pain: Secondary | ICD-10-CM

## 2019-05-06 MED ORDER — PLENVU 140 G PO SOLR: 1.0000 | Freq: Once | ORAL | 0 refills | Status: AC

## 2019-05-06 NOTE — Patient Instructions (Addendum)
You have been scheduled for an endoscopy and colonoscopy. Please follow the written instructions given to you at your visit today. Please use the plenvu kit you have been given today. If you use inhalers (even only as needed), please bring them with you on the day of your procedure.    I appreciate the opportunity to care for you. Silvano Rusk, MD, [facility]

## 2019-05-06 NOTE — Progress Notes (Signed)
Alison Mendoza 38 y.o. 1981/01/14 [id number]  Assessment & Plan:   Encounter Diagnoses  Name Primary?  . Diarrhea, unspecified type Yes  . Chronic RLQ pain   . Intractable vomiting with nausea, unspecified vomiting type     She needs evaluation with an EGD and a colonoscopy.  I agree with Dr. Roxanne Mins that she may have inflammatory bowel disease.  It sounds like a small trial of prednisone was somewhat helpful.  I considered the possibility of parasites or something like that but that seems unlikely given the overall history and lack of weight loss.  So proceeding with endoscopic evaluation at this time makes the most sense to me.  The risks and benefits as well as alternatives of endoscopic procedure(s) have been discussed and reviewed. All questions answered. The patient agrees to proceed.   Plenvu samples provided  Subjective:   Chief Complaint: Diarrhea, right lower quadrant pain nausea and vomiting x3 months  HPI  The patient is a 38 year old Alison Mendoza with a 53-month history of right lower quadrant right mid quadrant pain associated with watery diarrheal stools and nausea and vomiting.  She is been tired.  She has been to the emergency department at least twice, CT of the abdomen and pelvis and pelvic ultrasound unrevealing in May labs unrevealing, no weight loss in fact perhaps some weight gain.  She recently went in early August and a trial of prednisone was provided by Dr. Roxanne Mins and she thinks that helped some.  He was wondering about the possibility of inflammatory bowel disease.  She has some subjective fever.  She works as a Production manager but these symptoms started prior to that employment she has had no sick contacts.  She does not recall having a problem like this before though there is a remote history of Salmonella and parasites when she was in [place] it sounds like.    There is no rectal bleeding or blood in the stool.  She has not taken her temperature.   Symptoms are most prominent in the mornings.  She has not had incontinence.  She has some right gluteal and right lower extremity pain associated with this as well and some right back pain.  There does not seem to be any menstrual irregularities.  No Known Allergies Current Meds  Medication Sig  . metoCLOPramide (REGLAN) 10 MG tablet Take 1 tablet (10 mg total) by mouth every 6 (six) hours as needed for nausea (or headache).   Past Medical History:  Diagnosis Date  . Abnormal Pap smear   . Headache(784.0)   . History of worms    3 yrs ago  . Salmonella 2006   Past Surgical History:  Procedure Laterality Date  . CESAREAN SECTION  2003   done in [place]; vertical ext scar; unknown int. scar  . COLPOSCOPY     Social History   Social History Narrative   Boyfriend/Husband live w/ daughters   Plumber's helper   fromi [place]      Daughter 2015   Daughter 2003      Never smoker, no drugs, rare EtOH         family history includes Diabetes in her paternal aunt and sister; Lung disease in her mother.   Review of Systems All other review of systems appears negative.  Objective:   Physical Exam @BP  110/70 (BP Location: Left Arm, Patient Position: Sitting, Cuff Size: Normal)   Pulse 76   Temp 98 F (36.7 C)   Ht 4\' 11"  (  1.499 m) Comment: height measured without shoes  Wt 171 lb (77.6 kg)   LMP 04/08/2019   BMI 34.54 kg/m @  General:  Well-developed, well-nourished and in no acute distress Eyes:  anicteric. ENT:   Mouth and posterior pharynx free of lesions.  Neck:   supple w/o thyromegaly or mass.  Lungs: Clear to auscultation bilaterally. Heart:  S1S2, no rubs, murmurs, gallops. Abdomen:  soft, mildly tender RLQ, no hepatosplenomegaly, hernia, or mass and BS+.  Rectal: This is deferred until colonoscopy. Lymph:  no cervical or supraclavicular adenopathy. Extremities:   no edema, cyanosis or clubbing Skin   no rash. Neuro:  A&O x 3.  Psych:  appropriate mood and   Affect.   Data Reviewed: ER visits, labs, CT scanning ultrasound all things mentioned in the HPI

## 2019-05-06 NOTE — Progress Notes (Signed)
LMP 04/09/2019    Subjective:    Patient ID: Alison Mendoza, female    DOB: 1980-12-28, 38 y.o.   MRN: [id number]  HPI: Alison Mendoza is a 38 y.o. female presenting on 05/06/2019 for No chief complaint on file.   HPI    This is a telemedicine appointment through [employer] due to coronavirus pandemic  I connected with  Alison Mendoza on 05/06/19 by a video enabled telemedicine application and verified that I am speaking with the correct person using two identifiers.   I discussed the limitations of evaluation and management by telemedicine. The patient expressed understanding and agreed to proceed.  Pt is at home.  Provider is at office.    Pt recently went to ER- she says her pain got worse ad she had vomiting and diarrhea. She was referred to GI and has appointment ot follow up with GI and is scheduled for testing with that provider.  Pt has no other problems except for the GI issues.    Relevant past medical, surgical, family and social history reviewed and updated as indicated. Interim medical history since our last visit reviewed. Allergies and medications reviewed and updated.    Current Outpatient Medications:  .  metoCLOPramide (REGLAN) 10 MG tablet, Take 1 tablet (10 mg total) by mouth every 6 (six) hours as needed for nausea (or headache)., Disp: 30 tablet, Rfl: 0 .  PEG-KCl-NaCl-NaSulf-Na Asc-C (PLENVU) 140 g SOLR, Take 1 kit by mouth once for 1 dose., Disp: 1 each, Rfl: 0   Review of Systems  Per HPI unless specifically indicated above     Objective:    LMP 04/09/2019   Wt Readings from Last 3 Encounters:  05/06/19 171 lb (77.6 kg)  02/02/19 160 lb (72.6 kg)  10/10/18 165 lb (74.8 kg)    Physical Exam Constitutional:      General: She is not in acute distress.    Appearance: Normal appearance. She is obese. She is not ill-appearing.  HENT:     Head: Normocephalic and atraumatic.  Pulmonary:     Effort: Pulmonary effort is normal. No  respiratory distress.  Neurological:     Mental Status: She is alert and oriented to person, place, and time.  Psychiatric:        Attention and Perception: Attention normal.        Speech: Speech normal.        Behavior: Behavior is cooperative.     Results for orders placed or performed during the hospital encounter of 04/19/19  Lipase, blood  Result Value Ref Range   Lipase 39 11 - 51 U/L  Comprehensive metabolic panel  Result Value Ref Range   Sodium 139 135 - 145 mmol/L   Potassium 3.7 3.5 - 5.1 mmol/L   Chloride 107 98 - 111 mmol/L   CO2 22 22 - 32 mmol/L   Glucose, Bld 125 (H) 70 - 99 mg/dL   BUN 10 6 - 20 mg/dL   Creatinine, Ser 0.75 0.44 - 1.00 mg/dL   Calcium 9.0 8.9 - 10.3 mg/dL   Total Protein 7.0 6.5 - 8.1 g/dL   Albumin 3.7 3.5 - 5.0 g/dL   AST 18 15 - 41 U/L   ALT 23 0 - 44 U/L   Alkaline Phosphatase 98 38 - 126 U/L   Total Bilirubin 0.4 0.3 - 1.2 mg/dL   GFR calc non Af Amer >60 >60 mL/min   GFR calc Af Amer >60 >60 mL/min   Anion gap 10  5 - 15  CBC  Result Value Ref Range   WBC 10.7 (H) 4.0 - 10.5 K/uL   RBC 4.52 3.87 - 5.11 MIL/uL   Hemoglobin 13.0 12.0 - 15.0 g/dL   HCT 40.5 36.0 - 46.0 %   MCV 89.6 80.0 - 100.0 fL   MCH 28.8 26.0 - 34.0 pg   MCHC 32.1 30.0 - 36.0 g/dL   RDW 13.4 11.5 - 15.5 %   Platelets 213 150 - 400 K/uL   nRBC 0.0 0.0 - 0.2 %  Urinalysis, Routine w reflex microscopic  Result Value Ref Range   Color, Urine YELLOW YELLOW   APPearance CLEAR CLEAR   Specific Gravity, Urine 1.024 1.005 - 1.030   pH 6.0 5.0 - 8.0   Glucose, UA NEGATIVE NEGATIVE mg/dL   Hgb urine dipstick NEGATIVE NEGATIVE   Bilirubin Urine NEGATIVE NEGATIVE   Ketones, ur NEGATIVE NEGATIVE mg/dL   Protein, ur NEGATIVE NEGATIVE mg/dL   Nitrite NEGATIVE NEGATIVE   Leukocytes,Ua NEGATIVE NEGATIVE  I-Stat beta hCG blood, ED  Result Value Ref Range   I-stat hCG, quantitative <5.0 <5 mIU/mL   Comment 3              Assessment & Plan:   Encounter Diagnoses   Name Primary?  . Abdominal pain, unspecified abdominal location Yes  . Not proficient in English language      -pt to follow up with GI as scheduled -Pt counseled on [facility] financial assistance program and application is mailed to her.  Recommended that she discuss this with GI office prior to her procedure later this week. -pt to follow up in January with update PAP at that appointemnt.  Pt to contact office sooner prn

## 2019-05-08 ENCOUNTER — Telehealth: Payer: Self-pay

## 2019-05-08 NOTE — Telephone Encounter (Signed)
We should NOT do her procedures tomorrow. Cancel them  I want to check back with her on Monday to see how she is regarding the respiratory symptoms  If she develops more breathing problems, worse cough, fevers  or other difficulties she should seek help from primary care   If she continues to fell this way tomorrow she should get a Covid-19 test - she does not need an order or an appointment she can just go to the testing center - she did this in July already (was negative then) so should know how and where to go (think she did it in [place])

## 2019-05-08 NOTE — Telephone Encounter (Signed)
Informed patient of Dr.Gessner recommendations. Patient aware procedure is cancelled for tomorrow.

## 2019-05-08 NOTE — Telephone Encounter (Signed)
Covid-19 screening questions   Do you now or have you had a fever in the last 14 days? NO  Do you have any respiratory symptoms of shortness of breath or cough now or in the last 14 days?   NO, shortness of breath, YES, mild cough started yesterday with some cold symptoms.    Do you have any family members or close contacts with diagnosed or suspected Covid-19 in the past 14 days? NO  Have you been tested for Covid-19 and found to be positive? NO, tested but results were negative.       Confirmed with patient. Patient states her cold symptoms started yesterday with a mild cough. She states she doesn't have a fever. Forwarding to provider and admitting for further review.

## 2019-05-09 ENCOUNTER — Encounter: Payer: Self-pay | Admitting: Internal Medicine

## 2019-05-12 NOTE — Telephone Encounter (Signed)
Alison Mendoza,  Need to contact patient to get an update re: Covid sxs and status  I do not see a test in the chart  Will see if we can reschedule for this week depending upon status  Spanish speaking staff needed  Abigail Butts was quite helpful before

## 2019-05-12 NOTE — Telephone Encounter (Signed)
Alison Mendoza, [facility] spoke with the patient in [place].  She is doing much better.  She did not go for testing.  She has rescheduled her procedure for 05/16/19 1:30.

## 2019-05-15 ENCOUNTER — Telehealth: Payer: Self-pay

## 2019-05-15 NOTE — Telephone Encounter (Signed)
Covid-19 screening questions   Do you now or have you had a fever in the last 14 days? NO   Do you have any respiratory symptoms of shortness of breath or cough now or in the last 14 days? NO  Do you have any family members or close contacts with diagnosed or suspected Covid-19 in the past 14 days? NO  Have you been tested for Covid-19 and found to be positive? NO        

## 2019-05-16 ENCOUNTER — Other Ambulatory Visit: Payer: Self-pay

## 2019-05-16 ENCOUNTER — Ambulatory Visit (AMBULATORY_SURGERY_CENTER): Payer: Self-pay | Admitting: Internal Medicine

## 2019-05-16 ENCOUNTER — Encounter: Payer: Self-pay | Admitting: Internal Medicine

## 2019-05-16 VITALS — BP 103/63 | HR 52 | Temp 98.2°F | Resp 12 | Ht 59.0 in | Wt 171.0 lb

## 2019-05-16 DIAGNOSIS — K633 Ulcer of intestine: Secondary | ICD-10-CM

## 2019-05-16 DIAGNOSIS — G8929 Other chronic pain: Secondary | ICD-10-CM

## 2019-05-16 DIAGNOSIS — K52832 Lymphocytic colitis: Secondary | ICD-10-CM

## 2019-05-16 DIAGNOSIS — R197 Diarrhea, unspecified: Secondary | ICD-10-CM

## 2019-05-16 DIAGNOSIS — R112 Nausea with vomiting, unspecified: Secondary | ICD-10-CM

## 2019-05-16 DIAGNOSIS — R1031 Right lower quadrant pain: Secondary | ICD-10-CM

## 2019-05-16 MED ORDER — DICYCLOMINE HCL 20 MG PO TABS: 20.0000 mg | ORAL_TABLET | Freq: Three times a day (TID) | ORAL | 0 refills | Status: DC

## 2019-05-16 MED ORDER — SODIUM CHLORIDE 0.9 % IV SOLN: 500.0000 mL | Freq: Once | INTRAVENOUS | Status: DC

## 2019-05-16 NOTE — Progress Notes (Signed)
Report given to PACU, vss 

## 2019-05-16 NOTE — Progress Notes (Signed)
Interpretor at bedside in recovery  Pt is c/o abdominal cramping.  Per Dr. Carlean Purl- Levsin 0.125 mg SL tablets given  Pt is passing air.  She states she feels better when she releases air.  She has been passing air entire time while in the RR and abdomen is soft. Pt states she feels some relief as she continues to pass air   Low fiber diet handout given

## 2019-05-16 NOTE — Op Note (Signed)
[name] Procedure Date: 05/16/2019 1:34 PM MRN: [id number] Endoscopist: Gatha Mayer , MD Age: 38 Referring MD:  Date of Birth: Jun 20, 1981 Gender: Female Account #: [id number] Procedure:                Colonoscopy Indications:              Abdominal pain in the right lower quadrant,                            Clinically significant diarrhea of unexplained                            origin Medicines:                Propofol per Anesthesia, Monitored Anesthesia Care Procedure:                Pre-Anesthesia Assessment:                           - Prior to the procedure, a History and Physical                            was performed, and patient medications and                            allergies were reviewed. The patient's tolerance of                            previous anesthesia was also reviewed. The risks                            and benefits of the procedure and the sedation                            options and risks were discussed with the patient.                            All questions were answered, and informed consent                            was obtained. Prior Anticoagulants: The patient has                            taken no previous anticoagulant or antiplatelet                            agents. ASA Grade Assessment: I - A normal, healthy                            patient. After reviewing the risks and benefits,                            the patient was deemed in satisfactory condition to  undergo the procedure.                           After obtaining informed consent, the colonoscope                            was passed under direct vision. Throughout the                            procedure, the patient's blood pressure, pulse, and                            oxygen saturations were monitored continuously. The                            Colonoscope was introduced through the anus  and                            advanced to the the terminal ileum, with                            identification of the appendiceal orifice and IC                            valve. The colonoscopy was performed without                            difficulty. The patient tolerated the procedure                            well. The quality of the bowel preparation was                            excellent. The bowel preparation used was Plenvu                            via split dose instruction. The terminal ileum,                            ileocecal valve, appendiceal orifice, and rectum                            were photographed. Scope In: 1:46:00 PM Scope Out: 1:58:59 PM Scope Withdrawal Time: 0 hours 10 minutes 18 seconds  Total Procedure Duration: 0 hours 12 minutes 59 seconds  Findings:                 The perianal and digital rectal examinations were                            normal.                           The terminal ileum contained a single (solitary)  two mm ulcer. No bleeding was present. Biopsies                            were taken with a cold forceps for histology.                            Verification of patient identification for the                            specimen was done. Estimated blood loss was minimal.                           The exam was otherwise without abnormality on                            direct and retroflexion views.                           Biopsies for histology were taken with a cold                            forceps from the ascending colon, transverse colon,                            descending colon, sigmoid colon and rectum for                            evaluation of microscopic colitis.                           The exam was otherwise without abnormality on                            direct and retroflexion views. Complications:            No immediate complications. Estimated Blood Loss:      Estimated blood loss was minimal. Impression:               - A single (solitary) ulcer in the terminal ileum.                            Biopsied. ? if could have IBD but only one tiny                            ulcer can be incidental - she did possibly get                            benefit from empiric trial of steroids?                           - The examination was otherwise normal on direct                            and retroflexion views.                           -  The examination was otherwise normal on direct                            and retroflexion views.                           - Biopsies were taken with a cold forceps from the                            ascending colon, transverse colon, descending                            colon, sigmoid colon and rectum for evaluation of                            microscopic colitis. Recommendation:           - Patient has a contact number available for                            emergencies. The signs and symptoms of potential                            delayed complications were discussed with the                            patient. Return to normal activities tomorrow.                            Written discharge instructions were provided to the                            patient.                           - Low fiber diet.                           - Continue present medications.                           - Await pathology results.                           - Use Bentyl (dicyclomine) 20 mg PO QID 30 min AC. Gatha Mayer, MD 05/16/2019 2:14:35 PM This report has been signed electronically.

## 2019-05-16 NOTE — Progress Notes (Signed)
Pt's states no medical or surgical changes since previsit or office visit.  [facility] [place]

## 2019-05-16 NOTE — Op Note (Signed)
[facility] Patient Name: Alison Mendoza Procedure Date: 05/16/2019 1:35 PM MRN: [id number] Endoscopist: Gatha Mayer , MD Age: 38 Referring MD:  Date of Birth: 02/24/1981 Gender: Female Account #: [id number] Procedure:                Upper GI endoscopy Indications:              Abdominal pain in the right lower quadrant,                            Diarrhea, Nausea with vomiting Medicines:                Propofol per Anesthesia, Monitored Anesthesia Care Procedure:                Pre-Anesthesia Assessment:                           - Prior to the procedure, a History and Physical                            was performed, and patient medications and                            allergies were reviewed. The patient's tolerance of                            previous anesthesia was also reviewed. The risks                            and benefits of the procedure and the sedation                            options and risks were discussed with the patient.                            All questions were answered, and informed consent                            was obtained. Prior Anticoagulants: The patient has                            taken no previous anticoagulant or antiplatelet                            agents. ASA Grade Assessment: I - A normal, healthy                            patient. After reviewing the risks and benefits,                            the patient was deemed in satisfactory condition to                            undergo the procedure.  After obtaining informed consent, the endoscope was                            passed under direct vision. Throughout the                            procedure, the patient's blood pressure, pulse, and                            oxygen saturations were monitored continuously. The                            Endoscope was introduced through the mouth, and                            advanced to the  second part of duodenum. The upper                            GI endoscopy was accomplished without difficulty.                            The patient tolerated the procedure well. Scope In: Scope Out: Findings:                 The esophagus was normal.                           The stomach was normal.                           The examined duodenum was normal.                           The cardia and gastric fundus were normal on                            retroflexion. Complications:            No immediate complications. Estimated Blood Loss:     Estimated blood loss: none. Impression:               - Normal esophagus.                           - Normal stomach.                           - Normal examined duodenum.                           - No specimens collected. Recommendation:           - Patient has a contact number available for                            emergencies. The signs and symptoms of potential  delayed complications were discussed with the                            patient. Return to normal activities tomorrow.                            Written discharge instructions were provided to the                            patient.                           - Resume previous diet.                           - Continue present medications.                           - See the other procedure note for documentation of                            additional recommendations. Colonoscopy next Gatha Mayer, MD 05/16/2019 2:10:54 PM This report has been signed electronically.

## 2019-05-16 NOTE — Progress Notes (Signed)
Called to room to assist during endoscopic procedure.  Patient ID and intended procedure confirmed with present staff. Received instructions for my participation in the procedure from the performing physician.  

## 2019-05-16 NOTE — Patient Instructions (Addendum)
The only abnormality I found was one small spot ov ulceration or inflammation in the ileum or end of small intestine.  Everything else looked ok.  I took biopsies to see what things look like underthe microscope and understand your problems better.  No cancer or bad things seen.  The one tiny ulcer is not enough to make a disease diagnosis at this time.  All tests and findings so far are reassuring but you may have something called Irritable Bowel Syndrome.  Once I get the biopsy results we will call and explain them and what to do next.  For now try taking dicyclomine before meals and at bedtime - it slows diarrhea and relieves abdominal cramps. Go to the [facility] to pick up.  I appreciate the opportunity to care for you. Gatha Mayer, MD, FACG  USTED TUVO UN PROCEDIMIENTO ENDOSCPICO HOY EN EL [facility]:   Lea el informe del procedimiento que se le entreg para cualquier pregunta especfica sobre lo que se encontr durante su examen.  Si el informe del examen no responde a sus preguntas, por favor llame a su gastroenterlogo para aclararlo.  Si usted solicit que no se le den [employer] de lo que se Estate manager/land agent en su procedimiento al [employer] va a cuidar, entonces el informe del procedimiento se ha incluido en un sobre sellado para que usted lo revise despus cuando le sea ms conveniente.   LO QUE PUEDE ESPERAR: Algunas sensaciones de hinchazn en el abdomen.  Puede tener ms gases de lo normal.  El caminar puede ayudarle a eliminar el aire que se le puso en el tracto gastrointestinal durante el procedimiento y reducir la hinchazn.  Si le hicieron una endoscopia inferior (como una colonoscopia o una sigmoidoscopia flexible), podra notar manchas de sangre en las heces fecales o en el papel higinico.  Si se someti a una preparacin intestinal para su procedimiento, es posible que no tenga una evacuacin intestinal normal durante [employer].   Tenga en cuenta:  Es  posible que note un poco de irritacin y congestin en la nariz o algn drenaje.  Esto es debido al oxgeno [employer] durante su procedimiento.  No hay que preocuparse y esto debe desaparecer ms o Scientist, research (medical).   SNTOMAS PARA REPORTAR INMEDIATAMENTE:  Despus de una endoscopia inferior (colonoscopia o sigmoidoscopia flexible):  Cantidades excesivas de sangre en las heces fecales  Sensibilidad significativa o empeoramiento de los dolores abdominales   Hinchazn aguda del abdomen que antes no tena   Fiebre de 100F o ms   Despus de la endoscopia superior (EGD)  Vmitos de Biochemist, clinical o material como caf molido   Dolor en el pecho o dolor debajo de los omplatos que antes no tena   Dolor o dificultad persistente para tragar  Falta de aire que antes no tena   Fiebre de 100F o ms  Heces fecales negras y pegajosas   Para asuntos urgentes o de Freight forwarder, puede comunicarse con un gastroenterlogo a cualquier hora llamando al [phone].  DIETA:  Recomendamos una comida pequea al principio, pero luego puede continuar con su dieta normal.  Tome muchos lquidos, Teacher, adult education las bebidas alcohlicas durante 24 horas.    ACTIVIDAD:  Debe planear tomarse las cosas con calma por el resto del da y no debe CONDUCIR ni usar maquinaria pesada Programmer, applications (debido a los medicamentos de sedacin utilizados durante el examen).     SEGUIMIENTO: Nuestro personal llamar al nmero que aparece  en su historial al siguiente da hbil de su procedimiento para ver cmo se siente y para responder cualquier pregunta o inquietud que pueda tener con respecto a la informacin que se le dio despus del procedimiento. Si no podemos contactarle, le dejaremos un mensaje.  Sin embargo, si se siente bien y no tiene Paediatric nurse, no es necesario que nos devuelva la llamada.  Asumiremos que ha regresado a sus actividades diarias normales sin incidentes. Si se le tomaron algunas biopsias, le contactaremos por  telfono o por carta en las prximas 3 semanas.  Si no ha sabido [employer] biopsias en el transcurso de 3 semanas, por favor llmenos al [phone].   FIRMAS/CONFIDENCIALIDAD: Usted y/o el acompaante que le cuide han firmado documentos que se ingresarn en su historial mdico electrnico.  Estas firmas atestiguan el hecho de que la informacin anterior

## 2019-05-20 ENCOUNTER — Telehealth: Payer: Self-pay | Admitting: *Deleted

## 2019-05-20 NOTE — Telephone Encounter (Signed)
  Follow up Call-  Call back number 05/16/2019  Post procedure Call Back phone  # [phone] - can speak with daughter, Rosanne Sack  Permission to leave phone message Yes  Some recent data might be hidden     Patient questions:  Do you have a fever, pain , or abdominal swelling? No. Pain Score  0 *  Have you tolerated food without any problems? Yes.    Have you been able to return to your normal activities? Yes.    Do you have any questions about your discharge instructions: Diet   No. Medications  No. Follow up visit  No.  Do you have questions or concerns about your Care? No.  Actions: * If pain score is 4 or above: No action needed, pain <4.  1. Have you developed a fever since your procedure? no  2.   Have you had an respiratory symptoms (SOB or cough) since your procedure? no  3.   Have you tested positive for COVID 19 since your procedure no  4.   Have you had any family members/close contacts diagnosed with the COVID 19 since your procedure?  no   If yes to any of these questions please route to Joylene John, RN and Alphonsa Gin, Therapist, sports.

## 2019-05-23 ENCOUNTER — Encounter: Payer: Self-pay | Admitting: Internal Medicine

## 2019-05-23 ENCOUNTER — Telehealth: Payer: Self-pay

## 2019-05-23 DIAGNOSIS — K52832 Lymphocytic colitis: Secondary | ICD-10-CM

## 2019-05-23 HISTORY — DX: Lymphocytic colitis: K52.832

## 2019-05-23 MED ORDER — PREDNISONE 5 MG PO TABS: 20.0000 mg | ORAL_TABLET | Freq: Every day | ORAL | 0 refills | Status: DC

## 2019-05-23 NOTE — Progress Notes (Signed)
[facility] - no letter or recall  Office - please have Spanish speaking staff explain that there was some inflammation in the colon - no infections or cancer   Plan  1) Prednisone taper 5 mg tabs  4/day x 1 week 3/day x 1 week 2/day x 1 week 1/day x 1 week  2) F/u me 1 month

## 2019-05-23 NOTE — Telephone Encounter (Signed)
Patient notified in Spanish be Alvester Chou, [facility].  All questions answered rx sent Follow up arranged for October 7

## 2019-05-23 NOTE — Telephone Encounter (Signed)
-----   Message from Gatha Mayer, MD sent at 05/23/2019  7:48 AM EDT ----- [facility] - no letter or recall  Office - please have Spanish speaking staff explain that there was some inflammation in the colon - no infections or cancer   Plan  1) Prednisone taper 5 mg tabs  4/day x 1 week 3/day x 1 week 2/day x 1 week 1/day x 1 week  2) F/u me 1 month

## 2019-06-27 ENCOUNTER — Ambulatory Visit: Payer: Self-pay | Admitting: Internal Medicine

## 2019-10-01 ENCOUNTER — Ambulatory Visit: Payer: Self-pay | Admitting: Physician Assistant

## 2019-10-09 ENCOUNTER — Other Ambulatory Visit (HOSPITAL_COMMUNITY)
Admission: RE | Admit: 2019-10-09 | Discharge: 2019-10-09 | Disposition: A | Discharge status: Home / Self Care | Payer: Self-pay | Source: Ambulatory Visit | Attending: Physician Assistant | Admitting: Physician Assistant

## 2019-10-09 ENCOUNTER — Other Ambulatory Visit: Payer: Self-pay

## 2019-10-09 ENCOUNTER — Encounter: Payer: Self-pay | Admitting: Physician Assistant

## 2019-10-09 ENCOUNTER — Ambulatory Visit: Payer: Self-pay | Admitting: Physician Assistant

## 2019-10-09 VITALS — BP 102/64 | HR 82 | Temp 97.2°F | Wt 169.6 lb

## 2019-10-09 DIAGNOSIS — Z131 Encounter for screening for diabetes mellitus: Secondary | ICD-10-CM

## 2019-10-09 DIAGNOSIS — G8929 Other chronic pain: Secondary | ICD-10-CM

## 2019-10-09 DIAGNOSIS — Z124 Encounter for screening for malignant neoplasm of cervix: Secondary | ICD-10-CM

## 2019-10-09 DIAGNOSIS — Z789 Other specified health status: Secondary | ICD-10-CM

## 2019-10-09 LAB — GLUCOSE, POCT (MANUAL RESULT ENTRY): POC Glucose: 102 mg/dl — AB (ref 70–99)

## 2019-10-09 NOTE — Progress Notes (Signed)
BP 102/64   Pulse 82   Temp (!) 97.2 F (36.2 C)   Wt 169 lb 9.6 oz (76.9 kg)   LMP 09/24/2019   SpO2 98%   BMI 34.26 kg/m    Subjective:    Patient ID: Alison Mendoza, female    DOB: 12-Feb-1981, 39 y.o.   MRN: [id number]  HPI: Alison Mendoza is a 39 y.o. female presenting on 10/09/2019 for Gynecologic Exam   HPI   Pt had a negative covid 19 screening questionnaire.    Pt presents to office today to update PAP.    She is still having GI issues.  Pt was referred to GI and had colonoscopy and UGI on 05/16/19.  She was a no-show to her follow up appointment with them.  She keeps saying that they couldn't tell her why she was having problems.  Discussed with her that finding problems and solutions can be a process that takes more than one visit.    She has no other complaints today.       Relevant past medical, surgical, family and social history reviewed and updated as indicated. Interim medical history since our last visit reviewed. Allergies and medications reviewed and updated.   Current Outpatient Medications:  .  dicyclomine (BENTYL) 20 MG tablet, Take 1 tablet (20 mg total) by mouth 4 (four) times daily -  before meals and at bedtime., Disp: 120 tablet, Rfl: 0 .  metoCLOPramide (REGLAN) 10 MG tablet, Take 1 tablet (10 mg total) by mouth every 6 (six) hours as needed for nausea (or headache)., Disp: 30 tablet, Rfl: 0    Review of Systems  Per HPI unless specifically indicated above     Objective:    BP 102/64   Pulse 82   Temp (!) 97.2 F (36.2 C)   Wt 169 lb 9.6 oz (76.9 kg)   LMP 09/24/2019   SpO2 98%   BMI 34.26 kg/m   Wt Readings from Last 3 Encounters:  10/09/19 169 lb 9.6 oz (76.9 kg)  05/16/19 171 lb (77.6 kg)  05/06/19 171 lb (77.6 kg)    Physical Exam Vitals and nursing note reviewed.  Constitutional:      General: She is not in acute distress.    Appearance: Normal appearance. She is well-developed. She is not ill-appearing.   HENT:     Head: Normocephalic and atraumatic.  Pulmonary:     Effort: Pulmonary effort is normal.  Abdominal:     Palpations: Abdomen is soft. There is no mass.     Tenderness: There is no abdominal tenderness. There is no guarding or rebound.  Genitourinary:    Labia:        Right: No rash, tenderness or lesion.        Left: No rash, tenderness or lesion.      Vagina: Normal.     Cervix: No cervical motion tenderness, discharge or friability.     Adnexa:        Right: No mass, tenderness or fullness.         Left: No mass, tenderness or fullness.       Comments: (nurse Berenice assisted) Skin:    General: Skin is warm and dry.  Neurological:     Mental Status: She is alert and oriented to person, place, and time.  Psychiatric:        Behavior: Behavior normal.     Results for orders placed or performed in visit on 10/09/19  POCT Glucose (CBG)  Result Value Ref Range   POC Glucose 102 (A) 70 - 99 mg/dl      Assessment & Plan:   Encounter Diagnoses  Name Primary?  . Routine Papanicolaou smear Yes  . Screening for diabetes mellitus (DM)   . Chronic abdominal pain   . Not proficient in [place] language      -Will renew referral to GI for ongoing GI problems -she is given another application for financial assistance -Pt to follow up here 1 year.  She is to contact office sooner prn

## 2019-10-09 NOTE — Patient Instructions (Signed)
[place] [place] [phone]

## 2019-10-13 LAB — CYTOLOGY - PAP
Comment: NEGATIVE
Diagnosis: NEGATIVE
High risk HPV: NEGATIVE

## 2019-10-28 ENCOUNTER — Encounter: Payer: Self-pay | Admitting: Nurse Practitioner

## 2019-11-07 ENCOUNTER — Ambulatory Visit: Payer: Self-pay | Admitting: Nurse Practitioner

## 2019-11-11 ENCOUNTER — Encounter: Payer: Self-pay | Admitting: Physician Assistant

## 2019-11-21 ENCOUNTER — Ambulatory Visit (HOSPITAL_COMMUNITY)
Admission: RE | Admit: 2019-11-21 | Discharge: 2019-11-21 | Disposition: A | Discharge status: Home / Self Care | Payer: Self-pay | Source: Ambulatory Visit | Attending: Physician Assistant | Admitting: Physician Assistant

## 2019-11-21 ENCOUNTER — Other Ambulatory Visit: Payer: Self-pay

## 2019-11-21 ENCOUNTER — Encounter: Payer: Self-pay | Admitting: Physician Assistant

## 2019-11-21 ENCOUNTER — Other Ambulatory Visit (INDEPENDENT_AMBULATORY_CARE_PROVIDER_SITE_OTHER): Payer: Self-pay

## 2019-11-21 ENCOUNTER — Ambulatory Visit: Payer: Self-pay | Admitting: Physician Assistant

## 2019-11-21 VITALS — BP 124/80 | HR 78 | Temp 97.3°F | Ht 59.0 in | Wt 168.1 lb

## 2019-11-21 DIAGNOSIS — R6883 Chills (without fever): Secondary | ICD-10-CM

## 2019-11-21 DIAGNOSIS — R1011 Right upper quadrant pain: Secondary | ICD-10-CM | POA: Insufficient documentation

## 2019-11-21 DIAGNOSIS — R112 Nausea with vomiting, unspecified: Secondary | ICD-10-CM | POA: Insufficient documentation

## 2019-11-21 DIAGNOSIS — K219 Gastro-esophageal reflux disease without esophagitis: Secondary | ICD-10-CM

## 2019-11-21 LAB — LIPASE: Lipase: 22 U/L (ref 11.0–59.0)

## 2019-11-21 LAB — COMPREHENSIVE METABOLIC PANEL
ALT: 15 U/L (ref 0–35)
AST: 15 U/L (ref 0–37)
Albumin: 4.1 g/dL (ref 3.5–5.2)
Alkaline Phosphatase: 85 U/L (ref 39–117)
BUN: 12 mg/dL (ref 6–23)
CO2: 25 mEq/L (ref 19–32)
Calcium: 9.2 mg/dL (ref 8.4–10.5)
Chloride: 107 mEq/L (ref 96–112)
Creatinine, Ser: 0.67 mg/dL (ref 0.40–1.20)
GFR: 97.54 mL/min (ref >60.00)
Glucose, Bld: 112 mg/dL — ABNORMAL HIGH (ref 70–99)
Potassium: 3.5 mEq/L (ref 3.5–5.1)
Sodium: 140 mEq/L (ref 135–145)
Total Bilirubin: 0.4 mg/dL (ref 0.2–1.2)
Total Protein: 7.6 g/dL (ref 6.0–8.3)

## 2019-11-21 LAB — CBC WITH DIFFERENTIAL/PLATELET
Basophils Absolute: 0 10*3/uL (ref 0.0–0.1)
Basophils Relative: 0.5 % (ref 0.0–3.0)
Eosinophils Absolute: 0.1 10*3/uL (ref 0.0–0.7)
Eosinophils Relative: 1.1 % (ref 0.0–5.0)
HCT: 39.2 % (ref 36.0–46.0)
Hemoglobin: 13 g/dL (ref 12.0–15.0)
Lymphocytes Relative: 22.5 % (ref 12.0–46.0)
Lymphs Abs: 2 10*3/uL (ref 0.7–4.0)
MCHC: 33.1 g/dL (ref 30.0–36.0)
MCV: 86.4 fl (ref 78.0–100.0)
Monocytes Absolute: 0.7 10*3/uL (ref 0.1–1.0)
Monocytes Relative: 7.4 % (ref 3.0–12.0)
Neutro Abs: 6.2 10*3/uL (ref 1.4–7.7)
Neutrophils Relative %: 68.5 % (ref 43.0–77.0)
Platelets: 212 10*3/uL (ref 150.0–400.0)
RBC: 4.54 Mil/uL (ref 3.87–5.11)
RDW: 14.2 % (ref 11.5–15.5)
WBC: 9.1 10*3/uL (ref 4.0–10.5)

## 2019-11-21 MED ORDER — ONDANSETRON 4 MG PO TBDP: 4.0000 mg | ORAL_TABLET | Freq: Four times a day (QID) | ORAL | 2 refills | Status: DC | PRN

## 2019-11-21 MED ORDER — OMEPRAZOLE 40 MG PO CPDR: 40.0000 mg | DELAYED_RELEASE_CAPSULE | Freq: Two times a day (BID) | ORAL | 3 refills | Status: DC

## 2019-11-21 NOTE — Progress Notes (Signed)
Chief Complaint: Right upper quadrant pain  HPI:    [name] is a 39 year old Hispanic female, known to Dr. Carlean Purl, who was referred to me by Soyla Dryer, PA-C for a complaint of right upper quadrant pain.      02/02/2019 CT abdomen pelvis with contrast with normal appendix and no CT findings to account for right lower quadrant pain.    05/16/2019 colonoscopy for right lower quadrant pain with a single solitary ulcer in the terminal ileum exam is otherwise normal.  Biopsies consistent with lymphocytic colitis from random sites.  Terminal ileum biopsy was normal.  Patient started on Bentyl 20 mg p.o. 4 times daily 30 minutes AC.  She was given a Prednisone taper 5 mg tabs 4/day x 1 week then 3/day x 1 week then 2/day x 1 days then 1/day x 1 week.    05/16/2019 EGD with normal esophagus, stomach and duodenum.    Today, the patient presents to clinic accompanied by an interpreter and explains that she has continued with right-sided abdominal pain over the past year since being seen by Dr. Carlean Purl.  Tells me though that this pain is in the right upper quadrant today.  Explains that when it starts will radiate down into her right lower quadrant.  Along with this has been nauseous, but over the past 3 weeks she has also been experiencing chills and has felt feverish as well as fatigue which is almost constant.  She has also started having some vomiting typically when she tries to eat, this can occur up to 2-3 times a day" nothing seems to settle".  Tells me though that bowel movements are normal.  Does remember being on the steroids and feels like they helped when she was on them, but ever since stopping them her symptoms have come back.    Also describes reflux symptoms daily and frequently.  She has never tried any medication for this.    Denies weight loss or change in bowel habits.  Past Medical History:  Diagnosis Date  . Abnormal Pap smear   . Headache(784.0)   . History of worms    3  yrs ago  . Lymphocytic colitis 05/23/2019  . Salmonella 2006    Past Surgical History:  Procedure Laterality Date  . CESAREAN SECTION  2003   done in [place]; vertical ext scar; unknown int. scar  . COLPOSCOPY      Current Outpatient Medications  Medication Sig Dispense Refill  . dicyclomine (BENTYL) 20 MG tablet Take 1 tablet (20 mg total) by mouth 4 (four) times daily -  before meals and at bedtime. 120 tablet 0  . metoCLOPramide (REGLAN) 10 MG tablet Take 1 tablet (10 mg total) by mouth every 6 (six) hours as needed for nausea (or headache). 30 tablet 0   No current facility-administered medications for this visit.    Allergies as of 11/21/2019  . (No Known Allergies)    Family History  Problem Relation Age of Onset  . Diabetes Sister   . Lung disease Mother   . Diabetes Paternal Aunt   . Colon cancer Neg Hx   . Esophageal cancer Neg Hx   . Rectal cancer Neg Hx   . Stomach cancer Neg Hx     Social History   Socioeconomic History  . Marital status: Significant Other    Spouse name: Not on file  . Number of children: 2  . Years of education: Not on file  . Highest education level: Not  on file  Occupational History  . Not on file  Tobacco Use  . Smoking status: Never Smoker  . Smokeless tobacco: Never Used  Substance and Sexual Activity  . Alcohol use: Yes    Comment: ocassionally drinks beer  . Drug use: No  . Sexual activity: Yes    Birth control/protection: Condom  Other Topics Concern  . Not on file  Social History Narrative   Boyfriend/Husband live w/ daughters   Plumber's helper   fromi [place]      Daughter 2015   Daughter 2003      Never smoker, no drugs, rare EtOH         Social Determinants of Health   Financial Resource Strain:   . Difficulty of Paying Living Expenses: Not on file  Food Insecurity:   . Worried About Charity fundraiser in the Last Year: Not on file  . Ran Out of Food in the Last Year: Not on file  Transportation Needs:    . Lack of Transportation (Medical): Not on file  . Lack of Transportation (Non-Medical): Not on file  Physical Activity:   . Days of Exercise per Week: Not on file  . Minutes of Exercise per Session: Not on file  Stress:   . Feeling of Stress : Not on file  Social Connections:   . Frequency of Communication with Friends and Family: Not on file  . Frequency of Social Gatherings with Friends and Family: Not on file  . Attends Religious Services: Not on file  . Active Member of Clubs or Organizations: Not on file  . Attends Archivist Meetings: Not on file  . Marital Status: Not on file  Intimate Partner Violence:   . Fear of Current or Ex-Partner: Not on file  . Emotionally Abused: Not on file  . Physically Abused: Not on file  . Sexually Abused: Not on file    Review of Systems:    Constitutional: No weight loss Cardiovascular: No chest pain  Respiratory: No SOB Gastrointestinal: See HPI and otherwise negative   Physical Exam:  Vital signs: BP 124/80 (BP Location: Left Arm, Patient Position: Sitting, Cuff Size: Normal)   Pulse 78   Temp (!) 97.3 F (36.3 C)   Ht 4\' 11"  (1.499 m)   Wt 168 lb 2 oz (76.3 kg)   SpO2 99%   BMI 33.96 kg/m   Constitutional:   Pleasant Hispanic female appears to be in mild distress, Well developed, Well nourished, alert and cooperative Respiratory: Respirations even and unlabored. Lungs clear to auscultation bilaterally.   No wheezes, crackles, or rhonchi.  Cardiovascular: Normal S1, S2. No MRG. Regular rate and rhythm. No peripheral edema, cyanosis or pallor.  Gastrointestinal:  Soft, nondistended, moderate RUQ/RLQ ttp with some involuntary guarding in RUQ. Normal bowel sounds. No appreciable masses or hepatomegaly. Rectal:  Not performed.  Psychiatric: Demonstrates good judgement and reason without abnormal affect or behaviors.  NO recent labs/imaging.  Assessment: 1.  Right upper quadrant abdominal pain: Over the past year per  patient, worse over the past 3 weeks now with a fever and chills and nausea and vomiting, no recent labs or imaging; concern for gallbladder origin versus possibly less likely IBD?  With benefit from steroids in the past and small ulcer in the ileum 2.  Fever/chills 3.  Nausea/vomiting 4.  GERD: Uncontrolled symptoms, possibly worse from bile reflux?  Versus gastritis, the last EGD was normal  Plan: 1.  Reviewed patient's colonoscopy, at that  time there was some concern given the small ulcer in the ileum, she was given Prednisone which she reports helped her, though biopsies did not show IBD, question if this is the case versus possible gallbladder origin given nausea and vomiting with right upper quadrant pain and fever and chills recently.  Concern for acute cholecystitis. 2.  Ordered right upper quadrant ultrasound stat today 3.  Ordered labs including CBC, CMP and lipase 4.  Prescribe Zofran 4 mg ODT every 4-6 hours as needed for nausea #30 with 2 refills 5.  Prescribed Omeprazole 40 mg daily, 30 to 60 minutes before breakfast #30 with 3 refills. 6. Results from above studies will determine when patient returns to clinic.  Recommendations to be given at that time.  Ellouise Newer, PA-C [facility] 11/21/2019, 11:44 AM  Cc: Soyla Dryer, PA-C

## 2019-11-21 NOTE — Patient Instructions (Addendum)
If you are age 40 or older, your body mass index should be between 23-30. Your Body mass index is 33.96 kg/m. If this is out of the aforementioned range listed, please consider follow up with your Primary Care Provider.  If you are age 4 or younger, your body mass index should be between 19-25. Your Body mass index is 33.96 kg/m. If this is out of the aformentioned range listed, please consider follow up with your Primary Care Provider.   Your provider has requested that you go to the basement level for lab work before leaving today. Press "B" on the elevator. The lab is located at the first door on the left as you exit the elevator.  We have sent the following medications to your pharmacy for you to pick up at your convenience: Omeprazole 40 mg daily 30-60 minutes before breakfast.  Zofran 4 mg ODT every 4-6 hours as needed for nausea.   You have been scheduled for an abdominal ultrasound at [facility] on TODAY at 2:30 pm. Please arrive 15 minutes prior to your appointment for registration. Make certain not to have anything to eat or drink 6 hours prior to your appointment. Should you need to reschedule your appointment, please contact radiology at [phone]. This test typically takes about 30 minutes to perform.

## 2019-11-30 ENCOUNTER — Encounter (HOSPITAL_COMMUNITY): Payer: Self-pay | Admitting: Emergency Medicine

## 2019-11-30 ENCOUNTER — Other Ambulatory Visit: Payer: Self-pay

## 2019-11-30 ENCOUNTER — Emergency Department (HOSPITAL_COMMUNITY)
Admission: EM | Admit: 2019-11-30 | Discharge: 2019-11-30 | Disposition: A | Discharge status: Home / Self Care | Payer: Self-pay | Attending: Emergency Medicine | Admitting: Emergency Medicine

## 2019-11-30 DIAGNOSIS — L299 Pruritus, unspecified: Secondary | ICD-10-CM | POA: Insufficient documentation

## 2019-11-30 DIAGNOSIS — R21 Rash and other nonspecific skin eruption: Secondary | ICD-10-CM | POA: Insufficient documentation

## 2019-11-30 DIAGNOSIS — Z79899 Other long term (current) drug therapy: Secondary | ICD-10-CM | POA: Insufficient documentation

## 2019-11-30 MED ORDER — HYDROXYZINE HCL 25 MG PO TABS: 25.0000 mg | ORAL_TABLET | ORAL | 0 refills | Status: DC | PRN

## 2019-11-30 MED ORDER — PREDNISONE 20 MG PO TABS: ORAL_TABLET | ORAL | 0 refills | Status: DC

## 2019-11-30 MED ORDER — HYDROXYZINE HCL 25 MG PO TABS
25.0000 mg | ORAL_TABLET | Freq: Once | ORAL | Status: AC
  Administered 2019-11-30: 25 mg via ORAL
  Filled 2019-11-30: qty 1

## 2019-11-30 MED ORDER — METHYLPREDNISOLONE SODIUM SUCC 125 MG IJ SOLR
125.0000 mg | Freq: Once | INTRAMUSCULAR | Status: AC
  Administered 2019-11-30: 125 mg via INTRAMUSCULAR
  Filled 2019-11-30: qty 2

## 2019-11-30 MED ORDER — DIPHENHYDRAMINE HCL 25 MG PO CAPS
50.0000 mg | ORAL_CAPSULE | Freq: Once | ORAL | Status: AC
  Administered 2019-11-30: 50 mg via ORAL
  Filled 2019-11-30: qty 2

## 2019-11-30 NOTE — ED Notes (Signed)
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 Pt advised she started itching at 1900 after being exposed at 1600 on 11/29/2019.

## 2019-11-30 NOTE — ED Notes (Signed)
Discharge instructions reviewed with pt. Pt verbalized understanding.   

## 2019-11-30 NOTE — ED Triage Notes (Signed)
Pt c/o rash to bilat arms, pt reports she was working in the yard and may have been exposed to poison ivy

## 2019-12-01 NOTE — ED Provider Notes (Signed)
[facility] EMERGENCY DEPARTMENT Provider Note   CSN: [id number] Arrival date & time: 11/30/19  0459     History Chief Complaint  Patient presents with  . Rash    Alison Mendoza is a 39 y.o. female.  The history is provided by the patient.  Rash Location:  Shoulder/arm, torso and leg Quality: itchiness and redness   Severity:  Mild Onset quality:  Gradual Duration:  5 hours Timing:  Constant Progression:  Waxing and waning Chronicity:  New Context: chemical exposure, plant contact and sun exposure   Context: not exposure to similar rash, not new detergent/soap and not pollen   Relieved by:  Nothing Worsened by:  Nothing Ineffective treatments:  Antihistamines Associated symptoms: no abdominal pain, no fever and no joint pain        Past Medical History:  Diagnosis Date  . Abnormal Pap smear   . Headache(784.0)   . History of worms    3 yrs ago  . Lymphocytic colitis 05/23/2019  . Salmonella 2006    Patient Active Problem List   Diagnosis Date Noted  . Lymphocytic colitis 05/23/2019  . Obesity 01/29/2016    Past Surgical History:  Procedure Laterality Date  . CESAREAN SECTION  2003   done in [place]; vertical ext scar; unknown int. scar  . COLPOSCOPY       OB History    Gravida  2   Para  2   Term  2   Preterm  0   AB  0   Living  2     SAB  0   TAB  0   Ectopic  0   Multiple  0   Live Births  2           Family History  Problem Relation Age of Onset  . Diabetes Sister   . Lung disease Mother   . Diabetes Paternal Aunt   . Colon cancer Neg Hx   . Esophageal cancer Neg Hx   . Rectal cancer Neg Hx   . Stomach cancer Neg Hx     Social History   Tobacco Use  . Smoking status: Never Smoker  . Smokeless tobacco: Never Used  Substance Use Topics  . Alcohol use: Yes    Comment: ocassionally drinks beer  . Drug use: No    Home Medications Prior to Admission medications   Medication Sig Start Date  End Date Taking? Authorizing Provider  dicyclomine (BENTYL) 20 MG tablet Take 1 tablet (20 mg total) by mouth 4 (four) times daily -  before meals and at bedtime. 05/16/19   Gatha Mayer, MD  hydrOXYzine (ATARAX/VISTARIL) 25 MG tablet Take 1 tablet (25 mg total) by mouth every 4 (four) hours as needed for itching. 11/30/19   Brianna Bennett, Corene Cornea, MD  metoCLOPramide (REGLAN) 10 MG tablet Take 1 tablet (10 mg total) by mouth every 6 (six) hours as needed for nausea (or headache). [id number]   Delora Fuel, MD  omeprazole (PRILOSEC) 40 MG capsule Take 1 capsule (40 mg total) by mouth 2 (two) times daily. 11/21/19   Levin Erp, PA  ondansetron (ZOFRAN ODT) 4 MG disintegrating tablet Take 1 tablet (4 mg total) by mouth every 6 (six) hours as needed for nausea or vomiting. 11/21/19   Levin Erp, PA  predniSONE (DELTASONE) 20 MG tablet 3 tabs po daily x 3 days, then 2 tabs x 3 days, then 1.5 tabs x 3 days, then 1 tab x 3 days,  then 0.5 tabs x 3 days 11/30/19   Charnese Federici, Corene Cornea, MD  promethazine (PHENERGAN) 25 MG tablet 1 po q 8 hour prn nausea.  1 tab por boca cada 8 horas cuando sea necesario para las nausea 04/02/19 04/20/19  Soyla Dryer, PA-C    Allergies    Patient has no known allergies.  Review of Systems   Review of Systems  Constitutional: Negative for fever.  Gastrointestinal: Negative for abdominal pain.  Musculoskeletal: Negative for arthralgias.  Skin: Positive for rash.  All other systems reviewed and are negative.   Physical Exam Updated Vital Signs BP 111/74   Pulse 68   Temp 98.4 F (36.9 C) (Oral)   Resp 19   LMP 11/07/2019   SpO2 99%   Physical Exam Vitals and nursing note reviewed.  Constitutional:      Appearance: She is well-developed.  HENT:     Head: Normocephalic and atraumatic.  Eyes:     Pupils: Pupils are equal, round, and reactive to light.  Cardiovascular:     Rate and Rhythm: Normal rate and regular rhythm.  Pulmonary:     Effort: No  respiratory distress.     Breath sounds: Normal breath sounds. No stridor.  Abdominal:     General: There is no distension.     Palpations: Abdomen is soft.  Musculoskeletal:        General: Normal range of motion.     Cervical back: Normal range of motion.     Right lower leg: No edema.     Left lower leg: No edema.  Skin:    General: Skin is warm and dry.     Findings: Rash (arms, legs, back with erythematous areas with excoriation. no large papules. no petechiae. no purpura. no target lesions.) present.  Neurological:     General: No focal deficit present.     Mental Status: She is alert.     ED Results / Procedures / Treatments   Labs (all labs ordered are listed, but only abnormal results are displayed) Labs Reviewed - No data to display  EKG None  Radiology No results found.  Procedures Procedures (including critical care time)  Medications Ordered in ED Medications  diphenhydrAMINE (BENADRYL) capsule 50 mg (50 mg Oral Given 11/30/19 0601)  methylPREDNISolone sodium succinate (SOLU-MEDROL) 125 mg/2 mL injection 125 mg (125 mg Intramuscular Given 11/30/19 0602)  hydrOXYzine (ATARAX/VISTARIL) tablet 25 mg (25 mg Oral Given 11/30/19 0701)    ED Course  I have reviewed the triage vital signs and the nursing notes.  Pertinent labs & imaging results that were available during my care of the patient were reviewed by me and considered in my medical decision making (see chart for details).    MDM Rules/Calculators/A&P                      Rash of unclear etiology.  Will treat symptomatically.  Seems consistent with some type of allergic reaction rather than contact dermatitis.  She does not have any evidence of anaphylaxis.  Symptoms improved with treatment in the emergency room.  We will continue with the same at home and strict return precautions.  Final Clinical Impression(s) / ED Diagnoses Final diagnoses:  Rash    Rx / DC Orders ED Discharge Orders          Ordered    predniSONE (DELTASONE) 20 MG tablet     11/30/19 0639    hydrOXYzine (ATARAX/VISTARIL) 25 MG tablet  Every 4  hours PRN     11/30/19 0640           Shayonna Ocampo, Corene Cornea, MD 12/01/19 0830

## 2020-10-06 ENCOUNTER — Encounter: Payer: Self-pay | Admitting: Physician Assistant

## 2020-10-06 ENCOUNTER — Ambulatory Visit: Payer: Self-pay | Admitting: Physician Assistant

## 2020-10-06 DIAGNOSIS — R059 Cough, unspecified: Secondary | ICD-10-CM

## 2020-10-06 DIAGNOSIS — B349 Viral infection, unspecified: Secondary | ICD-10-CM

## 2020-10-06 DIAGNOSIS — Z1239 Encounter for other screening for malignant neoplasm of breast: Secondary | ICD-10-CM

## 2020-10-06 DIAGNOSIS — Z789 Other specified health status: Secondary | ICD-10-CM

## 2020-10-06 MED ORDER — BENZONATATE 100 MG PO CAPS: ORAL_CAPSULE | ORAL | 3 refills | Status: DC

## 2020-10-06 NOTE — Progress Notes (Unsigned)
   There were no vitals taken for this visit.   Subjective:    Patient ID: Alison Mendoza, female    DOB: [birth year], 40 y.o.   MRN: [id number]  HPI: Alison Mendoza is a 40 y.o. female presenting on 10/06/2020 for No chief complaint on file.   HPI    This is a telemedicine appointment through [employer] due to coronavirus pandemic.  I connected with  Alison Mendoza on 10/06/20 by a video enabled telemedicine application and verified that I am speaking with the correct person using two identifiers.   I discussed the limitations of evaluation and management by telemedicine. The patient expressed understanding and agreed to proceed.  Pt is in her parked car preparing to go into work.  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    Pt is 57yoF with appointment for routine follow up.  Pt reports Having fevers for a week.   Monday last week was last day she had fever.  She stayed home from work for a week.  She did a home covid test that was negative but it was a home test.   Her daughter was also sick at the time.    She got the covid vaccination- 2 doses.  Her Last dose was in august.     She is doing okay but is still with cough and congestion.    Her stomach is better.  Sometimes it hurts a little bit but not a lot.       Relevant past medical, surgical, family and social history reviewed and updated as indicated. Interim medical history since our last visit reviewed. Allergies and medications reviewed and updated.   CURRENT MEDS: OTC tukol   Review of Systems  Per HPI unless specifically indicated above     Objective:    There were no vitals taken for this visit.  Wt Readings from Last 3 Encounters:  11/21/19 168 lb 2 oz (76.3 kg)  10/09/19 169 lb 9.6 oz (76.9 kg)  05/16/19 171 lb (77.6 kg)    Physical Exam Constitutional:      General: She is not in acute distress.    Appearance: She is not toxic-appearing.  HENT:     Head: Normocephalic and  atraumatic.  Pulmonary:     Effort: Pulmonary effort is normal. No respiratory distress.  Neurological:     Mental Status: She is alert and oriented to person, place, and time.  Psychiatric:        Attention and Perception: Attention normal.        Speech: Speech normal.        Behavior: Behavior normal. Behavior is cooperative.              Assessment & Plan:    Encounter Diagnoses  Name Primary?  . Cough Yes  . Viral illness   . Encounter for screening for malignant neoplasm of breast, unspecified screening modality   . Not proficient in English language        -Recommended pt get covid booster when she recovers from her current illness -will refer for screening Mammogram -rx tessalon to help with her cough -pt to follow up one year.  She is to contact office sooner prn

## 2020-10-07 ENCOUNTER — Other Ambulatory Visit: Payer: Self-pay | Admitting: Student

## 2020-10-07 DIAGNOSIS — Z1239 Encounter for other screening for malignant neoplasm of breast: Secondary | ICD-10-CM

## 2020-11-03 ENCOUNTER — Other Ambulatory Visit: Payer: Self-pay

## 2020-11-03 ENCOUNTER — Encounter (HOSPITAL_COMMUNITY): Payer: Self-pay

## 2020-11-03 ENCOUNTER — Ambulatory Visit (HOSPITAL_COMMUNITY)
Admission: RE | Admit: 2020-11-03 | Discharge: 2020-11-03 | Disposition: A | Discharge status: Home / Self Care | Payer: Self-pay | Source: Ambulatory Visit | Attending: Physician Assistant | Admitting: Physician Assistant

## 2020-11-03 DIAGNOSIS — Z1239 Encounter for other screening for malignant neoplasm of breast: Secondary | ICD-10-CM | POA: Insufficient documentation

## 2021-01-27 ENCOUNTER — Telehealth: Payer: Self-pay | Admitting: Physician Assistant

## 2021-01-27 ENCOUNTER — Ambulatory Visit: Payer: Self-pay | Admitting: Physician Assistant

## 2021-01-27 NOTE — Telephone Encounter (Signed)
Nurse & translator [place] called pt.  Pt reports she is no longer having problems with her eye but would like to be seen for headache.  Pt was offered to come in today before lunchtime (due to availablitily of translator) but she declined, states she can only come in at 2:30pm.  She is scheduled for 02/08/21.

## 2021-02-08 ENCOUNTER — Other Ambulatory Visit: Payer: Self-pay

## 2021-02-08 ENCOUNTER — Ambulatory Visit: Payer: Self-pay | Admitting: Physician Assistant

## 2021-02-08 ENCOUNTER — Encounter: Payer: Self-pay | Admitting: Physician Assistant

## 2021-02-08 VITALS — BP 130/78 | HR 58 | Temp 97.5°F | Wt 181.0 lb

## 2021-02-08 DIAGNOSIS — Z9119 Patient's noncompliance with other medical treatment and regimen: Secondary | ICD-10-CM

## 2021-02-08 DIAGNOSIS — K219 Gastro-esophageal reflux disease without esophagitis: Secondary | ICD-10-CM

## 2021-02-08 DIAGNOSIS — Z789 Other specified health status: Secondary | ICD-10-CM

## 2021-02-08 DIAGNOSIS — Z91199 Patient's noncompliance with other medical treatment and regimen due to unspecified reason: Secondary | ICD-10-CM

## 2021-02-08 MED ORDER — PANTOPRAZOLE SODIUM 40 MG PO TBEC: 40.0000 mg | DELAYED_RELEASE_TABLET | Freq: Every day | ORAL | 3 refills | Status: DC

## 2021-02-08 NOTE — Patient Instructions (Signed)
[facility] Current Therapy 2021 (pp. 213-216). [place], [place]: Elsevier.">  Enfermedad de reflujo gastroesofgico en los adultos Gastroesophageal Reflux Disease, Adult El reflujo gastroesofgico (RGE) ocurre cuando el cido del estmago sube por el tubo que conecta la boca con el estmago (esfago). Normalmente, la comida baja por el esfago y se mantiene en el estmago, donde se la digiere. Sin embargo, cuando una persona tiene [place], los alimentos y el cido estomacal suelen volver al esfago. Si esto se vuelve un problema ms grave, a la persona se le puede diagnosticar una enfermedad llamada enfermedad de reflujo gastroesofgico (ERGE). La ERGE ocurre cuando el reflujo:  Sucede a menudo.  Causa sntomas frecuentes o graves.  Causa problemas tales como dao en el esfago. Cuando el cido del Insurance claims handler en contacto con el esfago, el cido puede provocar inflamacin en el esfago. Con el tiempo, pueden formarse pequeos agujeros (lceras) en el revestimiento del esfago. Cules son las causas? Esta afeccin se debe a un problema en el msculo que se encuentra entre el esfago y Product manager (esfnter esofgico inferior, o EEI). Normalmente, el EEI se cierra una vez que la comida pasa a travs del esfago hasta el [place]. Cuando el EEI se encuentra debilitado o tiene alguna anomala, no se cierra por completo, y eso permite que tanto la comida como el jugo gstrico, que es cido, [place] a subir por el esfago. El EEI puede debilitarse a causa de ciertas sustancias alimenticias, medicamentos y Product/process development scientist, que incluyen:  El consumo de [place].  Pesotum.  Tener una hernia de hiato.  Consumo de alcohol.  Ciertos alimentos y bebidas, como caf, chocolate, cebollas y [place]. Qu incrementa el riesgo? Es ms probable que tenga esta afeccin si:  Tiene un aumento del Engineer, site.  Tiene un trastorno del tejido conjuntivo.  Toma antiinflamatorios no esteroideos (AINE), como el  ibuprofeno. Cules son los signos o sntomas? Los sntomas de esta afeccin incluyen:  Merchant navy officer.  Dificultad o dolor para tragar o la sensacin de tener un bulto en la garganta.  Sabor amargo en la boca.  Mal aliento y [place] gran cantidad de saliva.  Estmago inflamado o con Tree surgeon y eructos.  Dolor en el pecho. El dolor de pecho puede deberse a distintas afecciones. Es importante que consulte al mdico si tiene dolor de [place].  Dificultad para respirar o sibilancias.  Tos constante (crnica) o tos nocturna.  Desgaste del Paramedic.  Prdida de peso. Cmo se diagnostica? Esta afeccin se puede diagnosticar en funcin de los antecedentes mdicos y un examen fsico. Para determinar si tiene ERGE leve o grave, el mdico tambin puede controlar cmo usted reacciona al tratamiento. Tambin pueden [employer], que [place] los siguientes:  Un estudio para examinarle el [place] y el esfago con una cmara pequea (endoscopa).  Una prueba para medir el grado de Musician.  Una prueba para medir cunta presin hay en el esfago.  Un estudio de deglucin con bario comn o modificado para ver la forma, el tamao y el funcionamiento del esfago. Cmo se trata? El tratamiento de esta afeccin puede variar segn la gravedad de los sntomas. El mdico puede recomendarle lo siguiente:  Cambios en la dieta.  Medicamentos.  [place]. El [place] del tratamiento es ayudar a Public house manager los sntomas y Product/process development scientist las complicaciones. Siga estas instrucciones en su casa: Comida y bebida  Siga la dieta recomendada por el mdico. Esto puede incluir evitar ciertos alimentos y bebidas, por ejemplo: ? Caf y t negro, con o  sin cafena. ? Bebidas que contengan alcohol. ? Bebidas energticas y deportivas. ? Bebidas gaseosas o refrescos. ? Chocolate y cacao. ? Menta y [place]. ? Ajo y cebolla. ? Rbano picante. ? Alimentos condimentados, picantes y cidos,  por ejemplo, todos los tipos de pimientas, [place] en polvo, curry en polvo, vinagre, salsas picantes y [employer]. ? Ctricos y sus jugos, por ejemplo, naranjas, limones y limas. ? Alimentos a base de tomate, como salsa de [place], [place], salsa picante y pizza con salsa de [place]. ? Alimentos fritos y [place], [place] donas, papas fritas y aderezos ricos en grasas. ? Carnes con alto contenido de grasa, como salchichas, y cortes de carnes rojas y blancas con mucha grasa, por ejemplo, chuletas o costillas, embutidos, jamn y tocino. ? Productos lcteos ricos en grasas, como leche [place], [place] y [place] crema.  Haga comidas pequeas y frecuentes Medical sales representative de comidas abundantes.  Evite beber grandes cantidades de lquidos con las comidas.  Evite comer 2 o 3horas antes de acostarse.  Evite recostarse inmediatamente despus de comer.  No haga ejercicios enseguida despus de comer.   Estilo de vida  No consuma ningn producto que contenga nicotina o tabaco. Estos productos incluyen cigarrillos, tabaco para Higher education careers adviser y aparatos de vapeo, como los Psychologist, sport and exercise. Si necesita ayuda para dejar de fumar, consulte al [employer].  Trate de reducir el estrs con mtodos como el yoga o la meditacin. Si necesita ayuda para reducir [employer] de estrs, consulte al mdico.  Si tiene sobrepeso, baje hasta llegar a un peso saludable para usted. Pdale consejos al mdico para bajar de peso de [place] segura.   Instrucciones generales  Est atento a cualquier cambio en los sntomas.  Use los medicamentos de venta libre y los recetados solamente como se lo haya indicado el mdico. No tome aspirina, ibuprofeno ni otros antiinflamatorios no esteroideos (AINE) a menos que el mdico le haya indicado que tome estos medicamentos.  Use ropa suelta. No use nada apretado alrededor de la cintura que haga presin sobre el abdomen.  Levante (eleve) la cabecera de la cama aproximadamente 6pulgadas  (15cm). Para hacerlo puede usar una cua.  Evite inclinarse si al hacerlo empeoran los sntomas.  Cumpla con todas las visitas de seguimiento. Esto es importante. Comunquese con un mdico si:  Tiene los siguientes sntomas: ? Sntomas nuevos. ? Prdida de peso sin causa aparente. ? Dificultad o dolor al tragar. ? Sibilancias o una tos persistente. ? Voz ronca.  Los sntomas no mejoran con Dispensing optician. Solicite ayuda de inmediato si:  Tree surgeon repentino [employer], el cuello, la [place], los dientes o la espalda.  De repente se siente transpirado, mareado o aturdido.  Siente falta de aire o Tourist information centre manager.  Vomita y el vmito es de color verde, amarillo o negro, o tiene un aspecto similar a la sangre o a los posos de caf.  Se desmaya.  Tiene heces rojas, sanguinolentas o negras.  No puede tragar, beber o comer. Estos sntomas pueden representar un problema grave que constituye Engineer, maintenance (IT). No espere a ver si los sntomas desaparecen. Solicite atencin mdica de inmediato. Comunquese con el servicio de emergencias de su localidad (911 en los [place]). No conduzca por sus propios medios [employer] Financial. Resumen  El reflujo gastroesofgico ocurre cuando el cido del estmago sube al esfago. La ERGE es una enfermedad en la que el reflujo ocurre con frecuencia, causa sntomas frecuentes o graves, o causa problemas  tales como Child psychotherapist.  El tratamiento de esta afeccin puede variar segn la gravedad de los sntomas. El mdico puede indicarle que siga una dieta y haga cambios en su estilo de vida, tome medicamentos o se someta a [place].  Comunquese con un mdico si tiene sntomas nuevos o los sntomas empeoran.  Use los medicamentos de venta libre y los recetados solamente como se lo haya indicado el mdico. No tome aspirina, ibuprofeno ni otros antiinflamatorios no esteroideos (AINE) a menos que el mdico se lo indique.  Concurra a  todas las visitas de seguimiento como se lo haya indicado el mdico. Esto es importante. Esta informacin no tiene Marine scientist el consejo del mdico. Asegrese de hacerle al mdico cualquier pregunta que tenga. Document Revised: 04/21/2020 Document Reviewed: 04/21/2020 Elsevier Patient Education  2021 [employer].

## 2021-02-08 NOTE — Progress Notes (Signed)
BP 130/78   Pulse (!) 58   Temp (!) 97.5 F (36.4 C)   Wt 181 lb (82.1 kg)   SpO2 99%   BMI 36.56 kg/m    Subjective:    Patient ID: Alison Mendoza, female    DOB: Mar 20, 1981, 40 y.o.   MRN: [id number]  HPI: Alison Mendoza is a 40 y.o. female presenting on 02/08/2021 for No chief complaint on file.   HPI    Pt had a negative covid 19 screening questionnaire.   Pt with appointment to follow up HA.  Pt reports that her Eye problem better and she has no HA.   She has ST and feels like she has bumps on her throat.   She feels tired and nausea.  She has trhrown up - 3 / week.  Her emesis is always after eating.   She reports Some abdominal pain.  Also A little bit of cough.  She has No congestion, No EA Pt reports dysphagia sometimes  She was put on meds last year for GERD but she didn't refill them.  She even was seen by GI.   She had EGD and colonoscopy but she was a no-show to her follow-up appointment with GI.   She again didn't bring her covid vacc card with her but says she is vaccinated.  She reports Using condoms- LMP- May 4       Relevant past medical, surgical, family and social history reviewed and updated as indicated. Interim medical history since our last visit reviewed. Allergies and medications reviewed and updated.   No current outpatient medications on file.     Review of Systems  Per HPI unless specifically indicated above     Objective:    BP 130/78   Pulse (!) 58   Temp (!) 97.5 F (36.4 C)   Wt 181 lb (82.1 kg)   SpO2 99%   BMI 36.56 kg/m   Wt Readings from Last 3 Encounters:  02/08/21 181 lb (82.1 kg)  11/21/19 168 lb 2 oz (76.3 kg)  10/09/19 169 lb 9.6 oz (76.9 kg)    Physical Exam Vitals reviewed.  Constitutional:      General: She is not in acute distress.    Appearance: She is well-developed. She is not ill-appearing.  HENT:     Head: Normocephalic and atraumatic.  Cardiovascular:     Rate and Rhythm: Normal  rate and regular rhythm.  Pulmonary:     Effort: Pulmonary effort is normal.     Breath sounds: Normal breath sounds.  Abdominal:     General: Bowel sounds are normal.     Palpations: Abdomen is soft. There is no mass.     Tenderness: There is no abdominal tenderness.  Musculoskeletal:     Cervical back: Neck supple.     Right lower leg: No edema.     Left lower leg: No edema.  Lymphadenopathy:     Cervical: No cervical adenopathy.  Skin:    General: Skin is warm and dry.  Neurological:     Mental Status: She is alert and oriented to person, place, and time.  Psychiatric:        Behavior: Behavior normal.              Assessment & Plan:    Encounter Diagnoses  Name Primary?  . Gastroesophageal reflux disease, unspecified whether esophagitis present Yes  . Not proficient in [place] language   . Personal history of noncompliance with medical treatment,  presenting hazards to health      -pt is prescribed protonix and counseled on lifestyle and dietary changes -she is to follow up 6 weeks.  She is to contact office sooner for worsening or new symptoms

## 2021-04-11 ENCOUNTER — Ambulatory Visit: Payer: Self-pay | Admitting: Physician Assistant

## 2021-04-11 DIAGNOSIS — Z789 Other specified health status: Secondary | ICD-10-CM

## 2021-04-11 DIAGNOSIS — K219 Gastro-esophageal reflux disease without esophagitis: Secondary | ICD-10-CM

## 2021-04-11 NOTE — Progress Notes (Signed)
   There were no vitals taken for this visit.   Subjective:    Patient ID: Natasha Bence, female    DOB: 1981/03/30, 40 y.o.   MRN: [id number]  HPI: Bharti Segrist is a 40 y.o. female presenting on 04/11/2021 for No chief complaint on file.   HPI  This is a telemedicine appointment due to coronavirus pandemic.   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  (She attempted to connect through [employer] and pt was able to be seen)  I connected with  Natasha Bence on 04/11/21 by a video enabled telemedicine application and verified that I am speaking with the correct person using two identifiers.   I discussed the limitations of evaluation and management by telemedicine. The patient expressed understanding and agreed to proceed.  Pt is at work.  Provider and translator are at office.   Appointment today is to follow up GERD.  Pt says her Jerrye Bushy symptoms are controlled.  She is taking the protonix.     Relevant past medical, surgical, family and social history reviewed and updated as indicated. Interim medical history since our last visit reviewed. Allergies and medications reviewed and updated.   Current Outpatient Medications:    pantoprazole (PROTONIX) 40 MG tablet, Take 1 tablet (40 mg total) by mouth daily. Tome una tableta por boca diaria, Disp: 30 tablet, Rfl: 3    Review of Systems  Per HPI unless specifically indicated above     Objective:    There were no vitals taken for this visit.  Wt Readings from Last 3 Encounters:  02/08/21 181 lb (82.1 kg)  11/21/19 168 lb 2 oz (76.3 kg)  10/09/19 169 lb 9.6 oz (76.9 kg)    Physical Exam Constitutional:      General: She is not in acute distress.    Appearance: She is not ill-appearing.  HENT:     Head: Normocephalic and atraumatic.  Pulmonary:     Effort: No respiratory distress.  Neurological:     Mental Status: She is alert and oriented to  person, place, and time.  Psychiatric:        Speech: Speech normal.          Assessment & Plan:    Encounter Diagnoses  Name Primary?   Gastroesophageal reflux disease, unspecified whether esophagitis present Yes   Not proficient in English language      -pt to continue protonix -Follow up 3 months.  She is to contact office sooner prn

## 2021-04-12 ENCOUNTER — Encounter: Payer: Self-pay | Admitting: Physician Assistant

## 2021-06-22 ENCOUNTER — Other Ambulatory Visit: Payer: Self-pay | Admitting: Physician Assistant

## 2021-06-23 ENCOUNTER — Other Ambulatory Visit: Payer: Self-pay | Admitting: Physician Assistant

## 2021-06-23 MED ORDER — PANTOPRAZOLE SODIUM 40 MG PO TBEC: 40.0000 mg | DELAYED_RELEASE_TABLET | Freq: Every day | ORAL | 1 refills | Status: DC

## 2021-07-04 ENCOUNTER — Encounter: Payer: Self-pay | Admitting: Physician Assistant

## 2021-07-04 ENCOUNTER — Other Ambulatory Visit: Payer: Self-pay

## 2021-07-04 ENCOUNTER — Ambulatory Visit: Payer: Self-pay | Admitting: Physician Assistant

## 2021-07-04 VITALS — BP 121/67 | HR 80 | Temp 97.7°F | Wt 180.0 lb

## 2021-07-04 DIAGNOSIS — Z789 Other specified health status: Secondary | ICD-10-CM

## 2021-07-04 DIAGNOSIS — G8929 Other chronic pain: Secondary | ICD-10-CM

## 2021-07-04 DIAGNOSIS — K219 Gastro-esophageal reflux disease without esophagitis: Secondary | ICD-10-CM

## 2021-07-04 NOTE — Progress Notes (Signed)
BP 121/67   Pulse 80   Temp 97.7 F (36.5 C)   Wt 180 lb (81.6 kg)   SpO2 99%   BMI 36.36 kg/m    Subjective:    Patient ID: Alison Mendoza, female    DOB: 01-27-1981, 39 y.o.   MRN: [id number]  HPI: Alison Mendoza is a 40 y.o. female presenting on 07/04/2021 for Gastroesophageal Reflux   HPI  Pt had a negative covid 19 screening questionnaire.   Chief Complaint  Patient presents with   Gastroesophageal Reflux   Pt says she Sometimes gets abdominal pain, mostly after eating.  It is usually when she eats spicy foods but sometimes not.    It happens Once or twice/week.  she has pain in abd sometimes not related to eating spicy foods as well.  She is using her protonix every day.  She was seen by gi but never returned after 3/21 (after [place] etc).  She has no other complaints    Relevant past medical, surgical, family and social history reviewed and updated as indicated. Interim medical history since our last visit reviewed. Allergies and medications reviewed and updated.   Current Outpatient Medications:    pantoprazole (PROTONIX) 40 MG tablet, Take 1 tablet (40 mg total) by mouth daily., Disp: 30 tablet, Rfl: 1    Review of Systems  Per HPI unless specifically indicated above     Objective:    BP 121/67   Pulse 80   Temp 97.7 F (36.5 C)   Wt 180 lb (81.6 kg)   SpO2 99%   BMI 36.36 kg/m   Wt Readings from Last 3 Encounters:  07/04/21 180 lb (81.6 kg)  02/08/21 181 lb (82.1 kg)  11/21/19 168 lb 2 oz (76.3 kg)    Physical Exam Vitals reviewed.  Constitutional:      General: She is not in acute distress.    Appearance: She is well-developed. She is obese. She is not ill-appearing.  HENT:     Head: Normocephalic and atraumatic.  Cardiovascular:     Rate and Rhythm: Normal rate and regular rhythm.  Pulmonary:     Effort: Pulmonary effort is normal.     Breath sounds: Normal breath sounds.  Abdominal:     General: Bowel sounds are normal.      Palpations: Abdomen is soft. There is no hepatomegaly, splenomegaly, mass or pulsatile mass.     Tenderness: There is abdominal tenderness in the right upper quadrant. There is no guarding or rebound.  Musculoskeletal:     Cervical back: Neck supple.     Right lower leg: No edema.     Left lower leg: No edema.  Lymphadenopathy:     Cervical: No cervical adenopathy.  Skin:    General: Skin is warm and dry.  Neurological:     Mental Status: She is alert and oriented to person, place, and time.  Psychiatric:        Behavior: Behavior normal.          Assessment & Plan:    Encounter Diagnoses  Name Primary?   Gastroesophageal reflux disease, unspecified whether esophagitis present Yes   Not proficient in English language    Chronic abdominal pain       -Pt encouraged to Stop eating spicy foods -will Refer back to GI -pt to Contine protonix -Mammogram UTD -pt is given application for [facility] [employer] financial assistance -Covid booster encouraged. -pt to follow up here 6 months.  She is encouraged to  contact office sooner prn worsening or new symtpoms

## 2021-09-28 ENCOUNTER — Ambulatory Visit: Payer: Self-pay | Admitting: Gastroenterology

## 2021-09-28 ENCOUNTER — Encounter: Payer: Self-pay | Admitting: Gastroenterology

## 2021-09-28 VITALS — BP 110/70 | HR 94 | Ht 59.0 in | Wt 180.0 lb

## 2021-09-28 DIAGNOSIS — R1011 Right upper quadrant pain: Secondary | ICD-10-CM

## 2021-09-28 DIAGNOSIS — R112 Nausea with vomiting, unspecified: Secondary | ICD-10-CM | POA: Insufficient documentation

## 2021-09-28 MED ORDER — DICYCLOMINE HCL 10 MG PO CAPS: 10.0000 mg | ORAL_CAPSULE | Freq: Three times a day (TID) | ORAL | 2 refills | Status: DC

## 2021-09-28 NOTE — Progress Notes (Addendum)
09/28/2021 Alison Mendoza [id number] 02-14-1981   HISTORY OF PRESENT ILLNESS: This is a 41 year old female who is a patient of Dr. Celesta Aver.  She is here today with primary complaints of right-sided abdominal pain and nausea and vomiting.  She has been reporting the same pain for almost the past 3 years.  She says that initially it was more intermittent and now has been more constant.  She says that it hurts more if she lifts something.  She says that it radiates down into the right side of her abdomen and into her back.  She was last seen here by one of our PAs for the same thing in March 2021.  Ultrasound at that time showed only fatty liver.  Labs were unremarkable.  She also had a CT scan of the abdomen pelvis back in May 2020 for the same complaint that was unremarkable.  She also reports intermittent nausea and vomiting.  She also reports burning pain down into her right leg/thigh.  When first asked she denied diarrhea, but then stated that she had multiple watery stools per day; when asked to describe further, she says that she would not consider it diarrhea.  Today she said the pain is currently a 6 out of 10 on the pain scale, but sometimes gets to an 8 out of 10 and she wants to go to the emergency room, but she does not.  She is on pantoprazole 40 mg daily.  05/16/2019 colonoscopy for right lower quadrant pain with a single solitary ulcer in the terminal ileum exam is otherwise normal.  Biopsies consistent with lymphocytic colitis from random sites.  Terminal ileum biopsy was normal.  Patient started on Bentyl 20 mg p.o. 4 times daily 30 minutes AC.  She was given a Prednisone taper 5 mg tabs 4/day x 1 week then 3/day x 1 week then 2/day x 1 days then 1/day x 1 week.  05/16/2019 EGD with normal esophagus, stomach and duodenum.  Of note, patient is primarily Spanish-speaking therefore the entire visit was performed via interpreter/translator.  Past Medical History:  Diagnosis Date    Abnormal Pap smear    Headache(784.0)    History of worms    3 yrs ago   Lymphocytic colitis 05/23/2019   Salmonella 2006   Past Surgical History:  Procedure Laterality Date   CESAREAN SECTION  2003   done in [place]; vertical ext scar; unknown int. scar   COLPOSCOPY      reports that she has never smoked. She has never used smokeless tobacco. She reports current alcohol use. She reports that she does not use drugs. family history includes Diabetes in her paternal aunt and sister; Lung disease in her mother. No Known Allergies    Outpatient Encounter Medications as of 09/28/2021  Medication Sig   pantoprazole (PROTONIX) 40 MG tablet Take 1 tablet (40 mg total) by mouth daily.   [DISCONTINUED] promethazine (PHENERGAN) 25 MG tablet 1 po q 8 hour prn nausea.  1 tab por boca cada 8 horas cuando sea necesario para las nausea   No facility-administered encounter medications on file as of 09/28/2021.     REVIEW OF SYSTEMS  : All other systems reviewed and negative except where noted in the History of Present Illness.   PHYSICAL EXAM: BP 110/70    Pulse 94    Ht 4\' 11"  (1.499 m)    Wt 180 lb (81.6 kg)    BMI 36.36 kg/m  General: Well developed Hispanic female  in no acute distress Head: Normocephalic and atraumatic Eyes:  Sclerae anicteric, conjunctiva pink. Ears: Normal auditory acuity Lungs: Clear throughout to auscultation; no W/R/R. Heart: Regular rate and rhythm; no M/R/G. Abdomen: Soft, non-distended.  BS present.  TTP in RUQ, RLQ, and epigastrium. Musculoskeletal: Symmetrical with no gross deformities  Skin: No lesions on visible extremities Extremities: No edema  Neurological: Alert oriented x 4, grossly non-focal Psychological:  Alert and cooperative. Normal mood and affect  ASSESSMENT AND PLAN: *Right upper quadrant abdominal pain with nausea and vomiting: This has been an intermittent issue for the past almost 3 years.  She has had CT scan, ultrasound, EGD, colonoscopy.   Only finding was of single solitary ulcer in the terminal ileum on colonoscopy and random biopsies were consistent with lymphocytic colitis.  She says that the pain has been more constant as of late.  She describes it hurting with lifting, etc.  She also has several other complaints, saying that the pain radiates down into the right lower quadrant and then also having burning pain down into her right leg/thigh.  She denies diarrhea, but then reported several loose stools per day.  Is really hard to know and decipher symptoms, I think it is somewhat of a language barrier although we had a very good interpreter today.  Her pain certainly sounds like it could be musculoskeletal, but has been present for years.  We will perform a HIDA scan to see if that shows any biliary dysfunction.  If that is negative then she may need another colonoscopy to reevaluate.  She was previously given Bentyl.  I am going to have her restart that today at 10 mg 3 times daily.  She is on pantoprazole 40 mg daily and she should continue that as well.  Prescription sent to pharmacy.   CC:  Soyla Dryer, PA-C

## 2021-09-28 NOTE — Patient Instructions (Signed)
We have sent the following medications to your pharmacy for you to pick up at your convenience: Dicyclomine 10 mg three times daily.  Se le ha programado una exploracin HIDA en [facility] Radiology (primer piso) el Rudi Coco 19/01/23. Llegue 15 minutos antes de su cita programada a las 9 am. Asegrese de no comer ni beber nada al menos 6 horas antes de la prueba. Si la fecha o la hora de esta cita no le conviene, llame a la programacin de radiologa al [phone]. _____________________________________________________________________ hepatobiliary (HIDA) scan is an imaging procedure used to diagnose problems in the liver, gallbladder and bile ducts. In the HIDA scan, a radioactive chemical or tracer is injected into a vein in your arm. The tracer is handled by the liver like bile. Bile is a fluid produced and excreted by your liver that helps your digestive system break down fats in the foods you eat. Bile is stored in your gallbladder and the gallbladder releases the bile when you eat a meal. A special nuclear medicine scanner (gamma camera) tracks the flow of the tracer from your liver into your gallbladder and small intestine.  During your HIDA scan  You'll be asked to change into a hospital gown before your HIDA scan begins. Your health care team will position you on a table, usually on your back. The radioactive tracer is then injected into a vein in your arm.The tracer travels through your bloodstream to your liver, where it's taken up by the bile-producing cells. The radioactive tracer travels with the bile from your liver into your gallbladder and through your bile ducts to your small intestine.You may feel some pressure while the radioactive tracer is injected into your vein. As you lie on the table, a special gamma camera is positioned over your abdomen taking pictures of the tracer as it moves through your body. The gamma camera takes pictures continually for about an hour. You'll need to keep  still during the HIDA scan. This can become uncomfortable, but you may find that you can lessen the discomfort by taking deep breaths and thinking about other things. Tell your health care team if you're uncomfortable. The radiologist will watch on a computer the progress of the radioactive tracer through your body. The HIDA scan may be stopped when the radioactive tracer is seen in the gallbladder and enters your small intestine. This typically takes about an hour. In some cases extra imaging will be performed if original images aren't satisfactory, if morphine is given to help visualize the gallbladder or if the medication CCK is given to look at the contraction of the gallbladder. This test typically takes 2 hours to complete. ____________________________________________________________________

## 2021-09-30 ENCOUNTER — Emergency Department (HOSPITAL_COMMUNITY)
Admission: EM | Admit: 2021-09-30 | Discharge: 2021-10-01 | Disposition: A | Discharge status: Home / Self Care | Payer: Self-pay | Attending: Emergency Medicine | Admitting: Emergency Medicine

## 2021-09-30 ENCOUNTER — Emergency Department (HOSPITAL_COMMUNITY): Payer: Self-pay

## 2021-09-30 ENCOUNTER — Other Ambulatory Visit: Payer: Self-pay

## 2021-09-30 DIAGNOSIS — R197 Diarrhea, unspecified: Secondary | ICD-10-CM | POA: Insufficient documentation

## 2021-09-30 DIAGNOSIS — R1110 Vomiting, unspecified: Secondary | ICD-10-CM | POA: Insufficient documentation

## 2021-09-30 DIAGNOSIS — R6883 Chills (without fever): Secondary | ICD-10-CM | POA: Insufficient documentation

## 2021-09-30 DIAGNOSIS — Z20822 Contact with and (suspected) exposure to covid-19: Secondary | ICD-10-CM | POA: Insufficient documentation

## 2021-09-30 DIAGNOSIS — D72829 Elevated white blood cell count, unspecified: Secondary | ICD-10-CM | POA: Insufficient documentation

## 2021-09-30 DIAGNOSIS — R1011 Right upper quadrant pain: Secondary | ICD-10-CM | POA: Insufficient documentation

## 2021-09-30 DIAGNOSIS — R300 Dysuria: Secondary | ICD-10-CM | POA: Insufficient documentation

## 2021-09-30 LAB — URINALYSIS, ROUTINE W REFLEX MICROSCOPIC
Glucose, UA: NEGATIVE mg/dL
Hgb urine dipstick: NEGATIVE
Ketones, ur: 15 mg/dL — AB
Leukocytes,Ua: NEGATIVE
Nitrite: NEGATIVE
Protein, ur: NEGATIVE mg/dL
Specific Gravity, Urine: >1.03 — ABNORMAL HIGH (ref 1.005–1.030)
pH: 5.5 (ref 5.0–8.0)

## 2021-09-30 LAB — I-STAT BETA HCG BLOOD, ED (MC, WL, AP ONLY): I-stat hCG, quantitative: 8.1 m[IU]/mL — ABNORMAL HIGH (ref <5)

## 2021-09-30 LAB — LIPASE, BLOOD: Lipase: 31 U/L (ref 11–51)

## 2021-09-30 LAB — COMPREHENSIVE METABOLIC PANEL
ALT: 22 U/L (ref 0–44)
AST: 21 U/L (ref 15–41)
Albumin: 4.1 g/dL (ref 3.5–5.0)
Alkaline Phosphatase: 96 U/L (ref 38–126)
Anion gap: 8 (ref 5–15)
BUN: 13 mg/dL (ref 6–20)
CO2: 20 mmol/L — ABNORMAL LOW (ref 22–32)
Calcium: 8.7 mg/dL — ABNORMAL LOW (ref 8.9–10.3)
Chloride: 108 mmol/L (ref 98–111)
Creatinine, Ser: 0.75 mg/dL (ref 0.44–1.00)
GFR, Estimated: >60 mL/min (ref >60)
Glucose, Bld: 120 mg/dL — ABNORMAL HIGH (ref 70–99)
Potassium: 3.7 mmol/L (ref 3.5–5.1)
Sodium: 136 mmol/L (ref 135–145)
Total Bilirubin: 0.7 mg/dL (ref 0.3–1.2)
Total Protein: 7.5 g/dL (ref 6.5–8.1)

## 2021-09-30 LAB — RESP PANEL BY RT-PCR (FLU A&B, COVID) ARPGX2
Influenza A by PCR: NEGATIVE
Influenza B by PCR: NEGATIVE
SARS Coronavirus 2 by RT PCR: NEGATIVE

## 2021-09-30 LAB — CBC
HCT: 40.6 % (ref 36.0–46.0)
Hemoglobin: 13 g/dL (ref 12.0–15.0)
MCH: 27.1 pg (ref 26.0–34.0)
MCHC: 32 g/dL (ref 30.0–36.0)
MCV: 84.8 fL (ref 80.0–100.0)
Platelets: 255 10*3/uL (ref 150–400)
RBC: 4.79 MIL/uL (ref 3.87–5.11)
RDW: 14.1 % (ref 11.5–15.5)
WBC: 17.4 10*3/uL — ABNORMAL HIGH (ref 4.0–10.5)
nRBC: 0 % (ref 0.0–0.2)

## 2021-09-30 MED ORDER — ONDANSETRON 4 MG PO TBDP
4.0000 mg | ORAL_TABLET | Freq: Once | ORAL | Status: AC
  Administered 2021-09-30: 4 mg via ORAL
  Filled 2021-09-30: qty 1

## 2021-09-30 MED ORDER — OXYCODONE-ACETAMINOPHEN 5-325 MG PO TABS
1.0000 | ORAL_TABLET | Freq: Once | ORAL | Status: AC
  Administered 2021-09-30: 1 via ORAL
  Filled 2021-09-30: qty 1

## 2021-09-30 NOTE — ED Triage Notes (Signed)
Pt here for abd pain w/ N/V/D that started yesterday. Pt has had intermittent abd pain since 2021 and sees [facility] but yesterday it came back worse. Pt reports pain to RUQ.

## 2021-09-30 NOTE — ED Provider Triage Note (Signed)
Emergency Medicine Provider Triage Evaluation Note  Alison Mendoza , a 41 y.o. female  was evaluated in triage.  Pt complains of Nausea, vomiting and abd pain and diarrhea. States abd pain started in 2021 however she states thepast has become much worse and moved into the RUQ of her abd over the past few days.    Review of Systems  Positive: Abd pain, nausea, vomiting  Negative: Fever  Physical Exam  BP 132/79    Pulse (!) 102    Temp 97.6 F (36.4 C) (Oral)    Resp 20    SpO2 100%  Gen:   Awake, no distress   Resp:  Normal effort  MSK:   Moves extremities without difficulty  Other:  RUQ TTP   Medical Decision Making  Medically screening exam initiated at 8:32 PM.  Appropriate orders placed.  Alison Mendoza was informed that the remainder of the evaluation will be completed by another provider, this initial triage assessment does not replace that evaluation, and the importance of remaining in the ED until their evaluation is complete.  [place] and labs   Tedd Sias, [place] 09/30/21 2037

## 2021-10-01 ENCOUNTER — Emergency Department (HOSPITAL_COMMUNITY): Payer: Self-pay

## 2021-10-01 LAB — HCG, QUANTITATIVE, PREGNANCY: hCG, Beta Chain, Quant, S: <1 m[IU]/mL (ref <5)

## 2021-10-01 MED ORDER — ONDANSETRON 4 MG PO TBDP: 4.0000 mg | ORAL_TABLET | Freq: Three times a day (TID) | ORAL | 0 refills | Status: DC | PRN

## 2021-10-01 MED ORDER — HYDROCODONE-ACETAMINOPHEN 5-325 MG PO TABS: 1.0000 | ORAL_TABLET | ORAL | 0 refills | Status: DC | PRN

## 2021-10-01 MED ORDER — IOHEXOL 300 MG/ML  SOLN
80.0000 mL | Freq: Once | INTRAMUSCULAR | Status: AC | PRN
  Administered 2021-10-01: 80 mL via INTRAVENOUS

## 2021-10-01 MED ORDER — ONDANSETRON HCL 4 MG/2ML IJ SOLN
4.0000 mg | Freq: Once | INTRAMUSCULAR | Status: AC
  Administered 2021-10-01: 4 mg via INTRAVENOUS
  Filled 2021-10-01: qty 2

## 2021-10-01 MED ORDER — FENTANYL CITRATE PF 50 MCG/ML IJ SOSY
100.0000 ug | PREFILLED_SYRINGE | Freq: Once | INTRAMUSCULAR | Status: AC
  Administered 2021-10-01: 100 ug via INTRAVENOUS
  Filled 2021-10-01: qty 2

## 2021-10-01 MED ORDER — SODIUM CHLORIDE 0.9 % IV BOLUS
1000.0000 mL | Freq: Once | INTRAVENOUS | Status: AC
  Administered 2021-10-01: 1000 mL via INTRAVENOUS

## 2021-10-01 NOTE — ED Provider Notes (Signed)
Pt signed out by Dr. Christy Gentles pending CT scan.  CT:  IMPRESSION:  1. No acute finding.  2. Hepatic steatosis.      Pt is feeling much better.  She is scheduled for a HIDA scan next week.  She is instructed to return if worse.  Due to language barrier, an interpreter was present during the history-taking and subsequent discussion (and for part of the physical exam) with this patient.    Isla Pence, MD 10/01/21 [phone]

## 2021-10-01 NOTE — ED Provider Notes (Signed)
[facility] EMERGENCY DEPARTMENT Provider Note   CSN: [id number] Arrival date & time: 09/30/21  1928     History  Chief Complaint  Patient presents with   Abdominal Pain    Alison Mendoza is a 41 y.o. female.  The history is provided by the patient. The history is limited by a language barrier. A language interpreter was used (spanish - normal - [id number]).  Abdominal Pain Pain location:  RUQ Pain quality: burning   Pain radiates to:  Does not radiate Pain severity:  Moderate Timing:  Constant Progression:  Worsening Chronicity:  New Associated symptoms: chills, diarrhea, dysuria and vomiting   Associated symptoms: no chest pain, no fever, no shortness of breath and no vaginal bleeding   She has had the pain for awhile but worse over past day with vomitng/diarrhea No h/o abdominal surgeries except for c-section    Home Medications Prior to Admission medications   Medication Sig Start Date End Date Taking? Authorizing Provider  dicyclomine (BENTYL) 10 MG capsule Take 1 capsule (10 mg total) by mouth 3 (three) times daily before meals. 09/28/21   Zehr, Laban Emperor, PA-C  pantoprazole (PROTONIX) 40 MG tablet Take 1 tablet (40 mg total) by mouth daily. 06/23/21   Soyla Dryer, PA-C  promethazine (PHENERGAN) 25 MG tablet 1 po q 8 hour prn nausea.  1 tab por boca cada 8 horas cuando sea necesario para las nausea 04/02/19 04/20/19  Soyla Dryer, PA-C      Allergies    Patient has no known allergies.    Review of Systems   Review of Systems  Constitutional:  Positive for chills. Negative for fever.  Respiratory:  Negative for shortness of breath.   Cardiovascular:  Negative for chest pain.  Gastrointestinal:  Positive for abdominal pain, diarrhea and vomiting. Negative for blood in stool.  Genitourinary:  Positive for dysuria. Negative for vaginal bleeding.       Lmp 12/20  All other systems reviewed and are negative.  Physical Exam Updated Vital  Signs BP 119/70    Pulse 74    Temp 97.6 F (36.4 C) (Oral)    Resp 15    SpO2 98%  Physical Exam CONSTITUTIONAL: Well developed/well nourished HEAD: Normocephalic/atraumatic EYES: EOMI/PERRL, no icterus ENMT: Mucous membranes moist NECK: supple no meningeal signs SPINE/BACK:entire spine nontender CV: S1/S2 noted, no murmurs/rubs/gallops noted LUNGS: Lungs are clear to auscultation bilaterally, no apparent distress ABDOMEN: soft, moderate right upper quadrant tenderness, mild RLQ tenderness, no rebound or guarding, bowel sounds noted throughout abdomen GU:no cva tenderness NEURO: Pt is awake/alert/appropriate, moves all extremitiesx4.  No facial droop.   EXTREMITIES: pulses normal/equal, full ROM SKIN: warm, color normal PSYCH: no abnormalities of mood noted, alert and oriented to situation  ED Results / Procedures / Treatments   Labs (all labs ordered are listed, but only abnormal results are displayed) Labs Reviewed  COMPREHENSIVE METABOLIC PANEL - Abnormal; Notable for the following components:      Result Value   CO2 20 (*)    Glucose, Bld 120 (*)    Calcium 8.7 (*)    All other components within normal limits  CBC - Abnormal; Notable for the following components:   WBC 17.4 (*)    All other components within normal limits  URINALYSIS, ROUTINE W REFLEX MICROSCOPIC - Abnormal; Notable for the following components:   Specific Gravity, Urine >1.030 (*)    Bilirubin Urine SMALL (*)    Ketones, ur 15 (*)    All  other components within normal limits  I-STAT BETA HCG BLOOD, ED ([facility], [facility], [facility] ONLY) - Abnormal; Notable for the following components:   I-stat hCG, quantitative 8.1 (*)    All other components within normal limits  RESP PANEL BY RT-PCR (FLU A&B, COVID) ARPGX2  LIPASE, BLOOD    EKG None  Radiology US Abdomen Limited RUQ (LIVER/GB)  Result Date: 09/30/2021 CLINICAL DATA:  Right upper quadrant pain EXAM: ULTRASOUND ABDOMEN LIMITED RIGHT UPPER QUADRANT COMPARISON:   11/21/2019 FINDINGS: Gallbladder: No gallstones or wall thickening visualized. No sonographic Murphy sign noted by sonographer. Common bile duct: Diameter: 5 mm. Liver: Diffuse increased echogenicity is noted without focal mass. Portal vein is patent on color Doppler imaging with normal direction of blood flow towards the liver. Other: None. IMPRESSION: Fatty infiltration of the liver is noted. No new focal abnormality is seen. Electronically Signed   By: Inez Catalina M.D.   On: 09/30/2021 21:27    Procedures Procedures    Medications Ordered in ED Medications  oxyCODONE-acetaminophen (PERCOCET/ROXICET) 5-325 MG per tablet 1 tablet (1 tablet Oral Given 09/30/21 2051)  ondansetron (ZOFRAN-ODT) disintegrating tablet 4 mg (4 mg Oral Given 09/30/21 2051)  fentaNYL (SUBLIMAZE) injection 100 mcg (100 mcg Intravenous Given 10/01/21 0552)  ondansetron (ZOFRAN) injection 4 mg (4 mg Intravenous Given 10/01/21 0552)  sodium chloride 0.9 % bolus 1,000 mL (1,000 mLs Intravenous New Bag/Given 10/01/21 0552)    ED Course/ Medical Decision Making/ A&P Clinical Course as of 10/01/21 0708  Sat Oct 01, 2021  0708 Signed out to Dr. Gilford Raid with CT abdomen pelvis pending [DW]    Clinical Course User Index [DW] Ripley Fraise, MD                           Medical Decision Making  This patient presents to the ED for concern of abdominal pain, this involves an extensive number of treatment options, and is a complaint that carries with it a high risk of complications and morbidity.  The differential diagnosis includes appendicitis, cholecystitis, pancreatitis, bowel perforation, UTI, ovarian torsion, TOA, ectopic pregnancy  Comorbidities that complicate the patient evaluation: Patients presentation is complicated by their history of obesity    Additional history obtained: Additional history obtained from family Records reviewed  GI notes reviewed  Lab Tests: I Ordered, and personally interpreted labs.   The pertinent results include: Leukocytosis  Imaging Studies ordered: I ordered imaging studies including Ultrasound right upper quadrant   I independently visualized and interpreted imaging which showed no acute finding I agree with the radiologist interpretation  Cardiac Monitoring: The patient was maintained on a cardiac monitor.  I personally viewed and interpreted the cardiac monitor which showed an underlying rhythm of:  sinus rhythm  Medicines ordered and prescription drug management: I ordered medication including IV fentanyl for pain Reevaluation of the patient after these medicines showed that the patient    stayed the same  Patient with persistent right upper quadrant abdominal pain now with right lower quadrant abdominal pain, vomiting and elevated white count.  We will proceed with CT imaging        Final Clinical Impression(s) / ED Diagnoses Final diagnoses:  RUQ abdominal pain    Rx / DC Orders ED Discharge Orders     None         Ripley Fraise, MD 10/01/21 0710

## 2021-10-06 ENCOUNTER — Other Ambulatory Visit: Payer: Self-pay

## 2021-10-06 ENCOUNTER — Ambulatory Visit (HOSPITAL_COMMUNITY)
Admission: RE | Admit: 2021-10-06 | Discharge: 2021-10-06 | Disposition: A | Discharge status: Home / Self Care | Payer: Self-pay | Source: Ambulatory Visit | Attending: Gastroenterology | Admitting: Gastroenterology

## 2021-10-06 DIAGNOSIS — R112 Nausea with vomiting, unspecified: Secondary | ICD-10-CM | POA: Insufficient documentation

## 2021-10-06 DIAGNOSIS — R1011 Right upper quadrant pain: Secondary | ICD-10-CM | POA: Insufficient documentation

## 2021-10-06 MED ORDER — TECHNETIUM TC 99M MEBROFENIN IV KIT
5.3000 | PACK | Freq: Once | INTRAVENOUS | Status: AC | PRN
  Administered 2021-10-06: 5.3 via INTRAVENOUS

## 2021-10-07 ENCOUNTER — Other Ambulatory Visit: Payer: Self-pay

## 2021-10-10 ENCOUNTER — Ambulatory Visit: Payer: Self-pay | Admitting: Physician Assistant

## 2021-11-22 ENCOUNTER — Encounter: Payer: Self-pay | Admitting: Internal Medicine

## 2021-11-22 ENCOUNTER — Telehealth: Payer: Self-pay | Admitting: Internal Medicine

## 2021-11-22 NOTE — Telephone Encounter (Signed)
OK no charge ?

## 2021-11-22 NOTE — Telephone Encounter (Signed)
Good Morning Dr. Carlean Purl ? ?I called patient at 8:20 today and patient stated she did not have insurance and was not going to be able to pay.  She stated she has applied for financial aid from [employer].  She said she was going to call but did not have the phone number. Once she receives the aid she will call back to schedule appointment. ?

## 2021-12-25 ENCOUNTER — Encounter (HOSPITAL_COMMUNITY): Payer: Self-pay

## 2021-12-25 ENCOUNTER — Emergency Department (HOSPITAL_COMMUNITY)
Admission: EM | Admit: 2021-12-25 | Discharge: 2021-12-25 | Disposition: A | Discharge status: Home / Self Care | Payer: Self-pay | Attending: Emergency Medicine | Admitting: Emergency Medicine

## 2021-12-25 ENCOUNTER — Other Ambulatory Visit: Payer: Self-pay

## 2021-12-25 DIAGNOSIS — M79605 Pain in left leg: Secondary | ICD-10-CM | POA: Insufficient documentation

## 2021-12-25 DIAGNOSIS — R000 Tachycardia, unspecified: Secondary | ICD-10-CM | POA: Insufficient documentation

## 2021-12-25 DIAGNOSIS — R599 Enlarged lymph nodes, unspecified: Secondary | ICD-10-CM | POA: Insufficient documentation

## 2021-12-25 DIAGNOSIS — J020 Streptococcal pharyngitis: Secondary | ICD-10-CM | POA: Insufficient documentation

## 2021-12-25 DIAGNOSIS — M79604 Pain in right leg: Secondary | ICD-10-CM | POA: Insufficient documentation

## 2021-12-25 DIAGNOSIS — Z20822 Contact with and (suspected) exposure to covid-19: Secondary | ICD-10-CM | POA: Insufficient documentation

## 2021-12-25 LAB — RESP PANEL BY RT-PCR (FLU A&B, COVID) ARPGX2
Influenza A by PCR: NEGATIVE
Influenza B by PCR: NEGATIVE
SARS Coronavirus 2 by RT PCR: NEGATIVE

## 2021-12-25 LAB — GROUP A STREP BY PCR: Group A Strep by PCR: DETECTED — AB

## 2021-12-25 MED ORDER — DIPHENHYDRAMINE HCL 12.5 MG/5ML PO LIQD: 5.0000 mL | Freq: Three times a day (TID) | ORAL | 0 refills | Status: DC | PRN

## 2021-12-25 MED ORDER — HYDROCODONE-ACETAMINOPHEN 5-325 MG PO TABS
1.0000 | ORAL_TABLET | Freq: Once | ORAL | Status: AC
  Administered 2021-12-25: 1 via ORAL
  Filled 2021-12-25: qty 1

## 2021-12-25 MED ORDER — IBUPROFEN 800 MG PO TABS
800.0000 mg | ORAL_TABLET | Freq: Once | ORAL | Status: AC
  Administered 2021-12-25: 800 mg via ORAL
  Filled 2021-12-25: qty 1

## 2021-12-25 MED ORDER — SODIUM CHLORIDE 0.9 % IV BOLUS
1000.0000 mL | Freq: Once | INTRAVENOUS | Status: AC
  Administered 2021-12-25: 1000 mL via INTRAVENOUS

## 2021-12-25 MED ORDER — AMOXICILLIN 500 MG PO CAPS: 500.0000 mg | ORAL_CAPSULE | Freq: Two times a day (BID) | ORAL | 0 refills | Status: DC

## 2021-12-25 MED ORDER — ONDANSETRON HCL 4 MG/2ML IJ SOLN
4.0000 mg | Freq: Once | INTRAMUSCULAR | Status: AC
Start: 2021-12-25 — End: 2021-12-25
  Administered 2021-12-25: 4 mg via INTRAVENOUS
  Filled 2021-12-25: qty 2

## 2021-12-25 MED ORDER — AMOXICILLIN 250 MG PO CAPS
500.0000 mg | ORAL_CAPSULE | Freq: Once | ORAL | Status: AC
  Administered 2021-12-25: 500 mg via ORAL
  Filled 2021-12-25: qty 2

## 2021-12-25 MED ORDER — IBUPROFEN 800 MG PO TABS: 800.0000 mg | ORAL_TABLET | Freq: Three times a day (TID) | ORAL | 0 refills | Status: DC

## 2021-12-25 MED ORDER — MORPHINE SULFATE (PF) 2 MG/ML IV SOLN
2.0000 mg | Freq: Once | INTRAVENOUS | Status: AC
  Administered 2021-12-25: 2 mg via INTRAVENOUS
  Filled 2021-12-25: qty 1

## 2021-12-25 NOTE — ED Notes (Signed)
Pt helped to bathroom. Urine sample on counter . Pt felt warm and temp taken 100.3. Cool wash cloth applied to forehead.  ?

## 2021-12-25 NOTE — ED Provider Notes (Signed)
?[facility] ?Provider Note ? ? ?CSN: [id number] ?Arrival date & time: 12/25/21  1329 ? ?  ? ?History ? ?Chief Complaint  ?Patient presents with  ? Fever  ? ? ?Alison Mendoza is a 41 y.o. female. ? ?The history is provided by the patient and the spouse. A language interpreter was used.  ?Fever ?Associated symptoms: chills, myalgias, nausea, sore throat and vomiting   ?Associated symptoms: no congestion, no cough, no diarrhea, no dysuria and no headaches   ? ? ? ?  ? ? ?Alison Mendoza is a 41 y.o. female who presents to the Emergency Department complaining of lower extremity body aches, fever, chills, and sore throat.  Symptoms been present for 2 days and began with sore throat.  She complains of constant aching pains of both legs.  She has taken Tylenol earlier today without relief.  She endorses having some nausea and vomiting last evening and early this morning.  Has been unable to tolerate fluids due to throat pain.  Patient's spouse who is at bedside states that she has been confused in talking about family members who have died.  She denies any shortness of breath, abdominal pain or diarrhea.  No rash.  States her daughter has similar symptoms and a rash on her face. ? ? ? ? ?Home Medications ?Prior to Admission medications   ?Medication Sig Start Date End Date Taking? Authorizing Provider  ?dicyclomine (BENTYL) 10 MG capsule Take 1 capsule (10 mg total) by mouth 3 (three) times daily before meals. 09/28/21   Zehr, Laban Emperor, PA-C  ?HYDROcodone-acetaminophen (NORCO/VICODIN) 5-325 MG tablet Take 1 tablet by mouth every 4 (four) hours as needed. 10/01/21   Isla Pence, MD  ?ondansetron (ZOFRAN-ODT) 4 MG disintegrating tablet Take 1 tablet (4 mg total) by mouth every 8 (eight) hours as needed for nausea or vomiting. 10/01/21   Isla Pence, MD  ?pantoprazole (PROTONIX) 40 MG tablet Take 1 tablet (40 mg total) by mouth daily. 06/23/21   Soyla Dryer, PA-C  ?promethazine (PHENERGAN)  25 MG tablet 1 po q 8 hour prn nausea.  1 tab por boca cada 8 horas cuando sea necesario para las nausea 04/02/19 04/20/19  Soyla Dryer, PA-C  ?   ? ?Allergies    ?Patient has no active allergies.   ? ?Review of Systems   ?Review of Systems  ?Constitutional:  Positive for appetite change, chills and fever.  ?HENT:  Positive for sore throat and trouble swallowing. Negative for congestion.   ?Respiratory:  Negative for cough.   ?Gastrointestinal:  Positive for nausea and vomiting. Negative for abdominal pain and diarrhea.  ?Genitourinary:  Negative for dysuria.  ?Musculoskeletal:  Positive for myalgias.  ?Neurological:  Negative for dizziness, weakness, numbness and headaches.  ? ?Physical Exam ?Updated Vital Signs ?Pulse (!) 127   Temp (!) 103.1 ?F (39.5 ?C) (Oral)   Resp 20   Wt 81.6 kg   SpO2 96%   BMI 36.36 kg/m?  ?Physical Exam ?Vitals and nursing note reviewed.  ?Constitutional:   ?   General: She is not in acute distress. ?   Appearance: Normal appearance. She is not ill-appearing.  ?HENT:  ?   Right Ear: Tympanic membrane and ear canal normal.  ?   Left Ear: Tympanic membrane and ear canal normal.  ?   Mouth/Throat:  ?   Mouth: Mucous membranes are moist.  ?   Pharynx: Uvula midline. Posterior oropharyngeal erythema present. No pharyngeal swelling, oropharyngeal exudate or uvula swelling.  ?  Comments: Significant erythema of the oropharynx.  No edema or tonsillar exudate.  Uvula midline nonedematous.  No bulging of the soft palate ?Cardiovascular:  ?   Rate and Rhythm: Regular rhythm. Tachycardia present.  ?   Pulses: Normal pulses.  ?Pulmonary:  ?   Effort: Pulmonary effort is normal. No respiratory distress.  ?   Breath sounds: No wheezing.  ?Abdominal:  ?   General: There is no distension.  ?   Palpations: Abdomen is soft.  ?   Tenderness: There is no abdominal tenderness. There is no guarding.  ?Musculoskeletal:  ?   Cervical back: No tenderness.  ?   Right lower leg: No edema.  ?   Left lower  leg: No edema.  ?Lymphadenopathy:  ?   Cervical: Cervical adenopathy present.  ?Skin: ?   General: Skin is warm.  ?   Capillary Refill: Capillary refill takes less than 2 seconds.  ?   Findings: No rash.  ?Neurological:  ?   General: No focal deficit present.  ?   Mental Status: She is alert.  ?   Sensory: No sensory deficit.  ?   Motor: No weakness.  ? ? ?ED Results / Procedures / Treatments   ?Labs ?(all labs ordered are listed, but only abnormal results are displayed) ?Labs Reviewed  ?GROUP A STREP BY PCR - Abnormal; Notable for the following components:  ?    Result Value  ? Group A Strep by PCR DETECTED (*)   ? All other components within normal limits  ?RESP PANEL BY RT-PCR (FLU A&B, COVID) ARPGX2  ? ? ?EKG ?None ? ?Radiology ?No results found. ? ?Procedures ?Procedures  ? ? ?Medications Ordered in ED ?Medications  ?ibuprofen (ADVIL) tablet 800 mg (has no administration in time range)  ?morphine (PF) 2 MG/ML injection 2 mg (has no administration in time range)  ?sodium chloride 0.9 % bolus 1,000 mL (has no administration in time range)  ?ondansetron (ZOFRAN) injection 4 mg (has no administration in time range)  ? ? ?ED Course/ Medical Decision Making/ A&P ?  ?                        ?Medical Decision Making ?Risk ?Prescription drug management. ? ? ?Patient here with 2-day history of sore throat, generalized body aches and fever.  Family member with similar symptoms.  I suspect this is related to strep pharyngitis. ?She is febrile on arrival with temp of 103.1 and tachycardic. ? ?Plan includes strep, COVID and flu testing will also administer IV fluids and provide antipyretic.  ? ?On recheck, patient reports feeling better.  Signs have improved.  She is no longer febrile.  She has tolerated oral fluids without difficulty.  No vomiting during ER stay.  Strep positive, COVID and influenza testing are negative. ? ?Today's Vitals  ? 12/25/21 1528 12/25/21 1600 12/25/21 1630 12/25/21 1645  ?BP:  116/67 99/61    ?Pulse: (!) 102 96 98   ?Resp: '18 16 17   '$ ?Temp:  98.6 ?F (37 ?C)    ?TempSrc:  Oral    ?SpO2: 98% 99% 98%   ?Weight:      ?PainSc:  10-Worst pain ever  10-Worst pain ever  ? ? ? ?I feel she is appropriate for discharge home at this time, she will alternate Tylenol and ibuprofen for fever and body aches, I have encouraged fluids and will prescribe amoxicillin for her strep.  She will follow-up with PCP.  Return  precautions were discussed. ? ? ? ? ? ? ? ? ? ?Final Clinical Impression(s) / ED Diagnoses ?Final diagnoses:  ?Strep pharyngitis  ? ? ?Rx / DC Orders ?ED Discharge Orders   ? ? None  ? ?  ? ? ?  ?Kem Parkinson, PA-C ?12/25/21 1756 ? ?  ?Daleen Bo, MD ?12/25/21 2109 ? ?

## 2021-12-25 NOTE — ED Triage Notes (Signed)
Patient complaining of fever, headache, and body aches for 2 days.  ?

## 2021-12-25 NOTE — Discharge Instructions (Signed)
Your test today showed that you have strep throat.  This is what is causing your body aches and fever.  It is important that you alternate Tylenol and ibuprofen to help keep your fever down.  Drink plenty of fluids.  Take the antibiotic as directed until its finished.  Please follow-up with your primary care provider for recheck, return emergency department for any new or worsening symptoms. ?

## 2022-01-02 ENCOUNTER — Ambulatory Visit: Payer: Self-pay | Admitting: Physician Assistant

## 2022-01-02 DIAGNOSIS — Z789 Other specified health status: Secondary | ICD-10-CM

## 2022-01-02 DIAGNOSIS — Z1239 Encounter for other screening for malignant neoplasm of breast: Secondary | ICD-10-CM

## 2022-01-02 DIAGNOSIS — J020 Streptococcal pharyngitis: Secondary | ICD-10-CM

## 2022-01-02 DIAGNOSIS — K219 Gastro-esophageal reflux disease without esophagitis: Secondary | ICD-10-CM

## 2022-01-02 NOTE — Progress Notes (Signed)
? ?There were no vitals taken for this visit.  ? ?Subjective:  ? ? Patient ID: Alison Mendoza, female    DOB: 06/13/1981, 41 y.o.   MRN: [id number] ? ?HPI: ?Alison Mendoza is a 41 y.o. female presenting on 01/02/2022 for No chief complaint on file. ? ? ?HPI ? ? ?This is a telemedicine appointment through [employer] ? ?I connected with  Alison Mendoza on 01/02/22 by a video enabled telemedicine application and verified that I am speaking with the correct person using two identifiers. ?  ?I discussed the limitations of evaluation and management by telemedicine. The patient expressed understanding and agreed to proceed. ? ?Pt is in her parked car.  Provider and translator are at office. ? ? ? ? ?Pt was scheduled for in person appointment but it was changed to virtual due to she is sick.   ? ?She doesn't know if she got apporved for cafa/[employer] financial assistance.  She says submitted it 2 months ago. ? ? ?She says her stomach is doing okay.  She was seen by GI and is recommended to have further testing. ? ? She says her throat still hurts a lot. She was seen in ER on 12/25/21 and diagnosed with strep throat.   She was given amoxil 500 bid and is continuing to take it.   ? ? ?She now says she thinks she got approved for cafa.  She says she got a letter in the mail recently.  ? ? ? ?Relevant past medical, surgical, family and social history reviewed and updated as indicated. Interim medical history since our last visit reviewed. ?Allergies and medications reviewed and updated. ? ? ?Current Outpatient Medications:  ?  amoxicillin (AMOXIL) 500 MG capsule, Take 1 capsule (500 mg total) by mouth 2 (two) times daily., Disp: 20 capsule, Rfl: 0 ?  ibuprofen (ADVIL) 800 MG tablet, Take 1 tablet (800 mg total) by mouth 3 (three) times daily., Disp: 21 tablet, Rfl: 0 ?  pantoprazole (PROTONIX) 40 MG tablet, Take 1 tablet (40 mg total) by mouth daily., Disp: 30 tablet, Rfl: 1 ?  dicyclomine (BENTYL) 10 MG capsule, Take 1  capsule (10 mg total) by mouth 3 (three) times daily before meals. (Patient not taking: Reported on 01/02/2022), Disp: 90 capsule, Rfl: 2 ?  HYDROcodone-acetaminophen (NORCO/VICODIN) 5-325 MG tablet, Take 1 tablet by mouth every 4 (four) hours as needed. (Patient not taking: Reported on 01/02/2022), Disp: 10 tablet, Rfl: 0 ?  magic mouthwash (lidocaine, diphenhydrAMINE, alum & mag hydroxide) suspension, Swish and spit 5 mLs 3 (three) times daily as needed for mouth pain. (Patient not taking: Reported on 01/02/2022), Disp: 360 mL, Rfl: 0 ?  ondansetron (ZOFRAN-ODT) 4 MG disintegrating tablet, Take 1 tablet (4 mg total) by mouth every 8 (eight) hours as needed for nausea or vomiting. (Patient not taking: Reported on 01/02/2022), Disp: 20 tablet, Rfl: 0 ? ? ?Review of Systems ? ?Per HPI unless specifically indicated above ? ?   ?Objective:  ?  ?There were no vitals taken for this visit.  ?Wt Readings from Last 3 Encounters:  ?12/25/21 180 lb (81.6 kg)  ?09/28/21 180 lb (81.6 kg)  ?07/04/21 180 lb (81.6 kg)  ?  ?Physical Exam ?Constitutional:   ?   General: She is not in acute distress. ?   Appearance: She is not toxic-appearing.  ?HENT:  ?   Head: Normocephalic and atraumatic.  ?Pulmonary:  ?   Effort: No respiratory distress.  ?   Comments: Pt is talking in  complete sentences without dyspnea ?Neurological:  ?   Mental Status: She is alert and oriented to person, place, and time.  ?Psychiatric:     ?   Attention and Perception: Attention normal.     ?   Speech: Speech normal.     ?   Behavior: Behavior normal.  ? ? ? ? ? ?   ?Assessment & Plan:  ? ? ?Encounter Diagnoses  ?Name Primary?  ? Strep pharyngitis Yes  ? Gastroesophageal reflux disease, unspecified whether esophagitis present   ? Not proficient in English language   ? Encounter for screening for malignant neoplasm of breast, unspecified screening modality   ? ? ? ?-pt encouraged to Stop IBU given by ER due to GI issues ?-pt is encouraged to continue her amoxil  until it is finished.  She can gargle with warm salt water for pain.  APAP is recommended if she needs oral analgesic ?-Pt Call to schedule f/u with GI since she says she was approved for financial assistance.  She is to contiinue the pantoprazole. ?-Screening mammogram ordered ?-pt to follow up 6 months.  She is to contact office sooner prn ? ?

## 2022-01-02 NOTE — Patient Instructions (Signed)
[facility] ?[phone] ?

## 2022-01-03 ENCOUNTER — Encounter: Payer: Self-pay | Admitting: Physician Assistant

## 2022-01-17 ENCOUNTER — Other Ambulatory Visit: Payer: Self-pay

## 2022-01-19 ENCOUNTER — Other Ambulatory Visit: Payer: Self-pay | Admitting: Physician Assistant

## 2022-01-19 DIAGNOSIS — Z1231 Encounter for screening mammogram for malignant neoplasm of breast: Secondary | ICD-10-CM

## 2022-01-30 ENCOUNTER — Ambulatory Visit
Admission: RE | Admit: 2022-01-30 | Discharge: 2022-01-30 | Disposition: A | Discharge status: Home / Self Care | Payer: Self-pay | Source: Ambulatory Visit | Attending: Physician Assistant | Admitting: Physician Assistant

## 2022-01-30 DIAGNOSIS — Z1231 Encounter for screening mammogram for malignant neoplasm of breast: Secondary | ICD-10-CM

## 2022-02-01 ENCOUNTER — Other Ambulatory Visit: Payer: Self-pay | Admitting: Obstetrics and Gynecology

## 2022-02-01 DIAGNOSIS — R928 Other abnormal and inconclusive findings on diagnostic imaging of breast: Secondary | ICD-10-CM

## 2022-02-02 ENCOUNTER — Other Ambulatory Visit (HOSPITAL_COMMUNITY): Payer: Self-pay | Admitting: Obstetrics and Gynecology

## 2022-02-02 DIAGNOSIS — R928 Other abnormal and inconclusive findings on diagnostic imaging of breast: Secondary | ICD-10-CM

## 2022-02-08 NOTE — Progress Notes (Signed)
Patient scheduled with [facility] at [facility] on 02/24/22.

## 2022-02-24 ENCOUNTER — Inpatient Hospital Stay (HOSPITAL_COMMUNITY): Payer: Self-pay | Attending: Obstetrics and Gynecology | Admitting: *Deleted

## 2022-02-24 ENCOUNTER — Encounter (HOSPITAL_COMMUNITY): Payer: Self-pay

## 2022-02-24 VITALS — BP 113/61 | Wt 183.2 lb

## 2022-02-24 DIAGNOSIS — Z1239 Encounter for other screening for malignant neoplasm of breast: Secondary | ICD-10-CM

## 2022-02-24 DIAGNOSIS — N644 Mastodynia: Secondary | ICD-10-CM

## 2022-02-24 DIAGNOSIS — N6311 Unspecified lump in the right breast, upper outer quadrant: Secondary | ICD-10-CM

## 2022-02-24 NOTE — Patient Instructions (Signed)
Explained breast self awareness with Pervis Hocking. Patient did not need a Pap smear today due to last Pap smear and HPV typing was 10/09/2019. Let her know BCCCP will cover Pap smears and HPV typing every 5 years unless has a history of abnormal Pap smears. Referred patient to [facility] for a right breast diagnostic mammogram per recommendation. Appointment scheduled Tuesday, February 28, 2022 at 1550. Patient aware of appointment and will be there. Pervis Hocking verbalized understanding.  Lundy Cozart, Arvil Chaco, RN 12:08 PM

## 2022-02-24 NOTE — Progress Notes (Signed)
Alison Mendoza is a 41 y.o. female who presents to [facility] clinic today with complaint of possible right breast lump x one month and right outer breast pain x 5 months. Patient states the pain comes and goes. Patient rates the pain at a 7 out of 10. Patient had a screening mammogram completed 01/30/2022 that additional imaging of the right breast is recommended for follow up.    Pap Smear: Pap smear not completed today. Last Pap smear was 10/09/2019 at the [facility] and was normal with negative HPV. Per patient has no history of an abnormal Pap smear. Last Pap smear result is available in Epic.   Physical exam: Breasts Breasts symmetrical. No skin abnormalities bilateral breasts. No nipple retraction bilateral breasts. No nipple discharge bilateral breasts. No lymphadenopathy. No lumps palpated left breast. Palpated a firm area right breast at 10 o'clock next to areola. Complaints when palpated right upper outer breast on exam.  MS DIGITAL SCREENING TOMO BILATERAL  Result Date: 01/31/2022 CLINICAL DATA:  Screening. EXAM: DIGITAL SCREENING BILATERAL MAMMOGRAM WITH TOMOSYNTHESIS AND CAD TECHNIQUE: Bilateral screening digital craniocaudal and mediolateral oblique mammograms were obtained. Bilateral screening digital breast tomosynthesis was performed. The images were evaluated with computer-aided detection. COMPARISON:  Previous exam(s). ACR Breast Density Category c: The breast tissue is heterogeneously dense, which may obscure small masses. FINDINGS: In the right breast, a possible mass warrants further evaluation. In the left breast, no findings suspicious for malignancy. IMPRESSION: Further evaluation is suggested for possible mass in the right breast. RECOMMENDATION: Ultrasound of the right breast. (Code:US-R-42M) The patient will be contacted regarding the findings, and additional imaging will be scheduled. BI-RADS CATEGORY  0: Incomplete. Need additional imaging evaluation  and/or prior mammograms for comparison. Electronically Signed   By: Alison Mendoza M.D.   On: 01/31/2022 11:41  MS DIGITAL SCREENING TOMO BILATERAL  Result Date: 11/08/2020 CLINICAL DATA:  Screening. EXAM: DIGITAL SCREENING BILATERAL MAMMOGRAM WITH TOMOSYNTHESIS AND CAD TECHNIQUE: Bilateral screening digital craniocaudal and mediolateral oblique mammograms were obtained. Bilateral screening digital breast tomosynthesis was performed. The images were evaluated with computer-aided detection. COMPARISON:  None. ACR Breast Density Category c: The breast tissue is heterogeneously dense, which may obscure small masses FINDINGS: There are no findings suspicious for malignancy. IMPRESSION: No mammographic evidence of malignancy. A result letter of this screening mammogram will be mailed directly to the patient. RECOMMENDATION: Screening mammogram in one year. (Code:SM-B-01Y) BI-RADS CATEGORY  1: Negative. Electronically Signed   By: Alison Mendoza M.D.   On: 11/08/2020 13:30       Pelvic/Bimanual Pap is not indicated today per BCCCP guidelines.   Smoking History: Patient has never smoked.   Patient Navigation: Patient education provided. Access to services provided for patient through [place] program. [place] interpreter Zenda Alpers from [facility] provided.    Breast and Cervical Cancer Risk Assessment: Patient does not have family history of breast cancer, known genetic mutations, or radiation treatment to the chest before age 2. Patient does not have history of cervical dysplasia, immunocompromised, or DES exposure in-utero.  Risk Assessment     Risk Scores       02/24/2022   Last edited by: Alison Revel, LPN   5-year risk: 0.4 %   Lifetime risk: 5.7 %            A: BCCCP exam without pap smear Complaint of right breast lump and pain.  P: Referred patient to [facility] for a right breast diagnostic mammogram per  recommendation. Appointment scheduled Tuesday, February 28, 2022  at 1550.  Alison Parish, RN 02/24/2022 12:08 PM

## 2022-02-28 ENCOUNTER — Other Ambulatory Visit (HOSPITAL_COMMUNITY): Payer: Self-pay | Admitting: Obstetrics and Gynecology

## 2022-02-28 ENCOUNTER — Ambulatory Visit (HOSPITAL_COMMUNITY)
Admission: RE | Admit: 2022-02-28 | Discharge: 2022-02-28 | Disposition: A | Discharge status: Home / Self Care | Payer: Self-pay | Source: Ambulatory Visit | Attending: Obstetrics and Gynecology | Admitting: Obstetrics and Gynecology

## 2022-02-28 DIAGNOSIS — R928 Other abnormal and inconclusive findings on diagnostic imaging of breast: Secondary | ICD-10-CM

## 2022-03-07 ENCOUNTER — Ambulatory Visit (HOSPITAL_COMMUNITY)
Admission: RE | Admit: 2022-03-07 | Discharge: 2022-03-07 | Disposition: A | Discharge status: Home / Self Care | Payer: Self-pay | Source: Ambulatory Visit | Attending: Physician Assistant | Admitting: Physician Assistant

## 2022-03-07 ENCOUNTER — Other Ambulatory Visit (HOSPITAL_COMMUNITY): Payer: Self-pay | Admitting: Physician Assistant

## 2022-03-07 ENCOUNTER — Ambulatory Visit (HOSPITAL_COMMUNITY)
Admission: RE | Admit: 2022-03-07 | Discharge: 2022-03-07 | Disposition: A | Discharge status: Home / Self Care | Payer: Self-pay | Source: Ambulatory Visit | Attending: Obstetrics and Gynecology | Admitting: Obstetrics and Gynecology

## 2022-03-07 DIAGNOSIS — R928 Other abnormal and inconclusive findings on diagnostic imaging of breast: Secondary | ICD-10-CM

## 2022-03-07 HISTORY — PX: BREAST BIOPSY: SHX20

## 2022-03-07 MED ORDER — LIDOCAINE HCL (PF) 2 % IJ SOLN
INTRAMUSCULAR | Status: AC
  Filled 2022-03-07: qty 10

## 2022-03-07 MED ORDER — LIDOCAINE-EPINEPHRINE (PF) 1 %-1:200000 IJ SOLN
INTRAMUSCULAR | Status: DC
Start: 2022-03-07 — End: 2022-03-07
  Filled 2022-03-07: qty 30

## 2022-03-08 LAB — SURGICAL PATHOLOGY

## 2022-07-03 ENCOUNTER — Ambulatory Visit: Payer: Self-pay | Admitting: Physician Assistant

## 2022-07-03 ENCOUNTER — Other Ambulatory Visit (HOSPITAL_COMMUNITY)
Admission: RE | Admit: 2022-07-03 | Discharge: 2022-07-03 | Disposition: A | Discharge status: Home / Self Care | Payer: Self-pay | Source: Ambulatory Visit | Attending: Physician Assistant | Admitting: Physician Assistant

## 2022-07-03 ENCOUNTER — Encounter: Payer: Self-pay | Admitting: Physician Assistant

## 2022-07-03 VITALS — BP 122/74 | HR 89 | Temp 98.7°F | Wt 186.0 lb

## 2022-07-03 DIAGNOSIS — K219 Gastro-esophageal reflux disease without esophagitis: Secondary | ICD-10-CM | POA: Insufficient documentation

## 2022-07-03 DIAGNOSIS — R109 Unspecified abdominal pain: Secondary | ICD-10-CM | POA: Insufficient documentation

## 2022-07-03 DIAGNOSIS — E669 Obesity, unspecified: Secondary | ICD-10-CM

## 2022-07-03 DIAGNOSIS — G8929 Other chronic pain: Secondary | ICD-10-CM

## 2022-07-03 DIAGNOSIS — Z789 Other specified health status: Secondary | ICD-10-CM

## 2022-07-03 LAB — COMPREHENSIVE METABOLIC PANEL
ALT: 22 U/L (ref 0–44)
AST: 19 U/L (ref 15–41)
Albumin: 3.9 g/dL (ref 3.5–5.0)
Alkaline Phosphatase: 107 U/L (ref 38–126)
Anion gap: 7 (ref 5–15)
BUN: 12 mg/dL (ref 6–20)
CO2: 25 mmol/L (ref 22–32)
Calcium: 8.8 mg/dL — ABNORMAL LOW (ref 8.9–10.3)
Chloride: 108 mmol/L (ref 98–111)
Creatinine, Ser: 0.88 mg/dL (ref 0.44–1.00)
GFR, Estimated: >60 mL/min (ref >60)
Glucose, Bld: 111 mg/dL — ABNORMAL HIGH (ref 70–99)
Potassium: 4.2 mmol/L (ref 3.5–5.1)
Sodium: 140 mmol/L (ref 135–145)
Total Bilirubin: 0.3 mg/dL (ref 0.3–1.2)
Total Protein: 7.6 g/dL (ref 6.5–8.1)

## 2022-07-03 LAB — CBC
HCT: 40.1 % (ref 36.0–46.0)
Hemoglobin: 12.8 g/dL (ref 12.0–15.0)
MCH: 27.2 pg (ref 26.0–34.0)
MCHC: 31.9 g/dL (ref 30.0–36.0)
MCV: 85.3 fL (ref 80.0–100.0)
Platelets: 247 10*3/uL (ref 150–400)
RBC: 4.7 MIL/uL (ref 3.87–5.11)
RDW: 14.8 % (ref 11.5–15.5)
WBC: 9.7 10*3/uL (ref 4.0–10.5)
nRBC: 0 % (ref 0.0–0.2)

## 2022-07-03 MED ORDER — PANTOPRAZOLE SODIUM 40 MG PO TBEC: 40.0000 mg | DELAYED_RELEASE_TABLET | Freq: Every day | ORAL | 0 refills | Status: DC

## 2022-07-03 NOTE — Progress Notes (Signed)
BP 122/74   Pulse 89   Temp 98.7 F (37.1 C)   Wt 186 lb (84.4 kg)   LMP 06/14/2022   SpO2 99%   BMI 37.57 kg/m    Subjective:    Patient ID: Alison Mendoza, female    DOB: February 23, 1981, 41 y.o.   MRN: [id number]  HPI: Alison Mendoza is a 41 y.o. female presenting on 07/03/2022 for No chief complaint on file.   HPI  Pt is 74yoF who is in today for routine follow up.  Pt says she ran out of the protonix last week.  She says her stomach still hurt even when she was taking it.  she saw GI who recommended further testing but she never got that done.    At her April appointment, she said that she was approved for cafa but she never called and r/s with GI.  Pt denies dysuria.       Relevant past medical, surgical, family and social history reviewed and updated as indicated. Interim medical history since our last visit reviewed. Allergies and medications reviewed and updated.    Current Outpatient Medications:    pantoprazole (PROTONIX) 40 MG tablet, Take 1 tablet (40 mg total) by mouth daily. (Patient not taking: Reported on 07/03/2022), Disp: 30 tablet, Rfl: 1    Review of Systems  Per HPI unless specifically indicated above     Objective:    BP 122/74   Pulse 89   Temp 98.7 F (37.1 C)   Wt 186 lb (84.4 kg)   LMP 06/14/2022   SpO2 99%   BMI 37.57 kg/m   Wt Readings from Last 3 Encounters:  07/03/22 186 lb (84.4 kg)  02/24/22 183 lb 3.2 oz (83.1 kg)  12/25/21 180 lb (81.6 kg)    Physical Exam Vitals reviewed.  Constitutional:      General: She is not in acute distress.    Appearance: She is well-developed. She is obese. She is not toxic-appearing.  HENT:     Head: Normocephalic and atraumatic.     Mouth/Throat:     Pharynx: No oropharyngeal exudate.  Eyes:     Extraocular Movements: Extraocular movements intact.     Conjunctiva/sclera: Conjunctivae normal.     Pupils: Pupils are equal, round, and reactive to light.     Comments: Pterigium  bilaterally  Neck:     Thyroid: No thyromegaly.  Cardiovascular:     Rate and Rhythm: Normal rate and regular rhythm.  Pulmonary:     Effort: Pulmonary effort is normal.     Breath sounds: Normal breath sounds.  Abdominal:     General: Bowel sounds are normal.     Palpations: Abdomen is soft. There is no mass.     Tenderness: There is abdominal tenderness in the epigastric area and suprapubic area. There is no guarding or rebound.  Musculoskeletal:     Cervical back: Neck supple.     Right lower leg: No edema.     Left lower leg: No edema.  Lymphadenopathy:     Cervical: No cervical adenopathy.  Skin:    General: Skin is warm and dry.  Neurological:     Mental Status: She is alert and oriented to person, place, and time.     Gait: Gait normal.  Psychiatric:        Behavior: Behavior normal.            Assessment & Plan:    Encounter Diagnoses  Name Primary?   Chronic  abdominal pain Yes   Gastroesophageal reflux disease, unspecified whether esophagitis present    Not proficient in English language    Obesity, unspecified classification, unspecified obesity type, unspecified whether serious comorbidity present       -pt counseled to Update enrollment; it expired 4 months ago -pt is given another Cafa/application for [employer] financial assistance -reminded pt that when she gets approved, she needs to reschedule with GI -her pantoprazole was refilled -labs were ordered so she can have a bill to get approved for cafa  -Mammogram- UTD- due May 2024 -PAP UTD -F/u several weeks to make sure she is moving along with getting back to GI.  Will discuss her eyes at that time.   She is to contact office sooner prn

## 2022-07-24 ENCOUNTER — Ambulatory Visit: Payer: Self-pay | Admitting: Physician Assistant

## 2022-07-26 ENCOUNTER — Encounter: Payer: Self-pay | Admitting: Physician Assistant

## 2022-07-26 ENCOUNTER — Ambulatory Visit: Payer: Self-pay | Admitting: Physician Assistant

## 2022-07-26 VITALS — BP 112/74 | HR 66 | Temp 98.2°F | Ht 59.0 in | Wt 184.3 lb

## 2022-07-26 DIAGNOSIS — G8929 Other chronic pain: Secondary | ICD-10-CM

## 2022-07-26 DIAGNOSIS — H11003 Unspecified pterygium of eye, bilateral: Secondary | ICD-10-CM

## 2022-07-26 DIAGNOSIS — Z789 Other specified health status: Secondary | ICD-10-CM

## 2022-07-26 NOTE — Progress Notes (Signed)
BP 112/74   Pulse 66   Temp 98.2 F (36.8 C)   Ht 4\' 11"  (1.499 m)   Wt 184 lb 4.8 oz (83.6 kg)   LMP 06/14/2022   SpO2 99%   BMI 37.22 kg/m    Subjective:    Patient ID: [name], female    DOB: 05-05-1981, 41 y.o.   MRN: [id number]  HPI: Alison Mendoza is a 41 y.o. female presenting on 07/26/2022 for Pain and Eye Problem (Growth on Bilateral eyes. Pt states she has trouble seeing letters close up. Pt also has trouble seeing from far. Pt does not use corrective lenses. )   HPI   Chief Complaint  Patient presents with   Pain   Eye Problem    Growth on Bilateral eyes. Pt states she has trouble seeing letters close up. Pt also has trouble seeing from far. Pt does not use corrective lenses.     She updated her enrollment.  She didn't call GI yet for follow up appointment.     Her eye has been bothering her for 7 months.   It makes it hard to see.  It doesn't hurt.     Relevant past medical, surgical, family and social history reviewed and updated as indicated. Interim medical history since our last visit reviewed. Allergies and medications reviewed and updated.    Current Outpatient Medications:    pantoprazole (PROTONIX) 40 MG tablet, Take 1 tablet (40 mg total) by mouth daily., Disp: 30 tablet, Rfl: 0     Review of Systems  Per HPI unless specifically indicated above     Objective:    BP 112/74   Pulse 66   Temp 98.2 F (36.8 C)   Ht 4\' 11"  (1.499 m)   Wt 184 lb 4.8 oz (83.6 kg)   LMP 06/14/2022   SpO2 99%   BMI 37.22 kg/m   Wt Readings from Last 3 Encounters:  07/26/22 184 lb 4.8 oz (83.6 kg)  07/03/22 186 lb (84.4 kg)  02/24/22 183 lb 3.2 oz (83.1 kg)    Physical Exam Vitals reviewed.  Constitutional:      General: She is not in acute distress.    Appearance: She is well-developed. She is obese. She is not toxic-appearing.  HENT:     Head: Normocephalic and atraumatic.  Eyes:     Extraocular Movements: Extraocular movements  intact.     Comments: Pterigium bilaterally Visual acuity noted  Cardiovascular:     Rate and Rhythm: Normal rate and regular rhythm.  Pulmonary:     Effort: Pulmonary effort is normal.     Breath sounds: Normal breath sounds.  Abdominal:     General: Bowel sounds are normal.     Palpations: Abdomen is soft. There is no mass.     Tenderness: There is generalized abdominal tenderness. There is no guarding or rebound.  Musculoskeletal:     Cervical back: Neck supple.     Right lower leg: No edema.     Left lower leg: No edema.  Lymphadenopathy:     Cervical: No cervical adenopathy.  Skin:    General: Skin is warm and dry.  Neurological:     Mental Status: She is alert and oriented to person, place, and time.  Psychiatric:        Behavior: Behavior normal.     Results for orders placed or performed during the hospital encounter of 07/03/22  Comprehensive metabolic panel  Result Value Ref Range   Sodium  140 135 - 145 mmol/L   Potassium 4.2 3.5 - 5.1 mmol/L   Chloride 108 98 - 111 mmol/L   CO2 25 22 - 32 mmol/L   Glucose, Bld 111 (H) 70 - 99 mg/dL   BUN 12 6 - 20 mg/dL   Creatinine, Ser 5.62 0.44 - 1.00 mg/dL   Calcium 8.8 (L) 8.9 - 10.3 mg/dL   Total Protein 7.6 6.5 - 8.1 g/dL   Albumin 3.9 3.5 - 5.0 g/dL   AST 19 15 - 41 U/L   ALT 22 0 - 44 U/L   Alkaline Phosphatase 107 38 - 126 U/L   Total Bilirubin 0.3 0.3 - 1.2 mg/dL   GFR, Estimated >13 >08 mL/min   Anion gap 7 5 - 15  CBC  Result Value Ref Range   WBC 9.7 4.0 - 10.5 K/uL   RBC 4.70 3.87 - 5.11 MIL/uL   Hemoglobin 12.8 12.0 - 15.0 g/dL   HCT 65.7 84.6 - 96.2 %   MCV 85.3 80.0 - 100.0 fL   MCH 27.2 26.0 - 34.0 pg   MCHC 31.9 30.0 - 36.0 g/dL   RDW 95.2 84.1 - 32.4 %   Platelets 247 150 - 400 K/uL   nRBC 0.0 0.0 - 0.2 %      Assessment & Plan:   Encounter Diagnoses  Name Primary?   Chronic abdominal pain Yes   Pterygium eye, bilateral    Not proficient in [place] language      -Reviewed labs with  pt -Refer to [facility] eye doctor for pterigium that are interfering with her seeing -Will check on pt's cafa/[employer] financial assistance -She is remineded to call GI to r/s once she gets approved for cafa -F/u here 3 months.  She is to contact office sooner prn

## 2022-07-27 ENCOUNTER — Emergency Department (HOSPITAL_COMMUNITY): Payer: Self-pay

## 2022-07-27 ENCOUNTER — Encounter (HOSPITAL_COMMUNITY): Payer: Self-pay

## 2022-07-27 ENCOUNTER — Other Ambulatory Visit: Payer: Self-pay

## 2022-07-27 ENCOUNTER — Emergency Department (HOSPITAL_COMMUNITY)
Admission: EM | Admit: 2022-07-27 | Discharge: 2022-07-28 | Disposition: A | Discharge status: Home / Self Care | Payer: Self-pay | Attending: Emergency Medicine | Admitting: Emergency Medicine

## 2022-07-27 DIAGNOSIS — M5442 Lumbago with sciatica, left side: Secondary | ICD-10-CM | POA: Insufficient documentation

## 2022-07-27 DIAGNOSIS — R7309 Other abnormal glucose: Secondary | ICD-10-CM | POA: Insufficient documentation

## 2022-07-27 DIAGNOSIS — E876 Hypokalemia: Secondary | ICD-10-CM | POA: Insufficient documentation

## 2022-07-27 LAB — CBC WITH DIFFERENTIAL/PLATELET
Abs Immature Granulocytes: 0.03 10*3/uL (ref 0.00–0.07)
Basophils Absolute: 0 10*3/uL (ref 0.0–0.1)
Basophils Relative: 0 %
Eosinophils Absolute: 0.2 10*3/uL (ref 0.0–0.5)
Eosinophils Relative: 2 %
HCT: 38.5 % (ref 36.0–46.0)
Hemoglobin: 12 g/dL (ref 12.0–15.0)
Immature Granulocytes: 0 %
Lymphocytes Relative: 28 %
Lymphs Abs: 2.5 10*3/uL (ref 0.7–4.0)
MCH: 26.7 pg (ref 26.0–34.0)
MCHC: 31.2 g/dL (ref 30.0–36.0)
MCV: 85.6 fL (ref 80.0–100.0)
Monocytes Absolute: 0.8 10*3/uL (ref 0.1–1.0)
Monocytes Relative: 9 %
Neutro Abs: 5.4 10*3/uL (ref 1.7–7.7)
Neutrophils Relative %: 61 %
Platelets: 238 10*3/uL (ref 150–400)
RBC: 4.5 MIL/uL (ref 3.87–5.11)
RDW: 14.5 % (ref 11.5–15.5)
WBC: 8.9 10*3/uL (ref 4.0–10.5)
nRBC: 0 % (ref 0.0–0.2)

## 2022-07-27 LAB — COMPREHENSIVE METABOLIC PANEL
ALT: 23 U/L (ref 0–44)
AST: 21 U/L (ref 15–41)
Albumin: 3.6 g/dL (ref 3.5–5.0)
Alkaline Phosphatase: 97 U/L (ref 38–126)
Anion gap: 8 (ref 5–15)
BUN: 12 mg/dL (ref 6–20)
CO2: 23 mmol/L (ref 22–32)
Calcium: 8.9 mg/dL (ref 8.9–10.3)
Chloride: 109 mmol/L (ref 98–111)
Creatinine, Ser: 0.99 mg/dL (ref 0.44–1.00)
GFR, Estimated: >60 mL/min (ref >60)
Glucose, Bld: 118 mg/dL — ABNORMAL HIGH (ref 70–99)
Potassium: 3.4 mmol/L — ABNORMAL LOW (ref 3.5–5.1)
Sodium: 140 mmol/L (ref 135–145)
Total Bilirubin: 0.3 mg/dL (ref 0.3–1.2)
Total Protein: 7 g/dL (ref 6.5–8.1)

## 2022-07-27 LAB — URINALYSIS, ROUTINE W REFLEX MICROSCOPIC
Bilirubin Urine: NEGATIVE
Glucose, UA: NEGATIVE mg/dL
Hgb urine dipstick: NEGATIVE
Ketones, ur: NEGATIVE mg/dL
Nitrite: NEGATIVE
Protein, ur: NEGATIVE mg/dL
Specific Gravity, Urine: 1.012 (ref 1.005–1.030)
pH: 6 (ref 5.0–8.0)

## 2022-07-27 LAB — I-STAT BETA HCG BLOOD, ED (MC, WL, AP ONLY): I-stat hCG, quantitative: <5 m[IU]/mL (ref <5)

## 2022-07-27 LAB — LIPASE, BLOOD: Lipase: 42 U/L (ref 11–51)

## 2022-07-27 MED ORDER — OXYCODONE-ACETAMINOPHEN 5-325 MG PO TABS
1.0000 | ORAL_TABLET | Freq: Once | ORAL | Status: AC
  Administered 2022-07-27: 1 via ORAL
  Filled 2022-07-27: qty 1

## 2022-07-27 NOTE — ED Provider Triage Note (Signed)
Emergency Medicine Provider Triage Evaluation Note  Alison Mendoza , a 41 y.o. female  was evaluated in triage.  Pt complains of back pain.  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  Ongoing pain in back and down left leg since this occurred.  Denies numbness/tingling.  Also reports abdominal pain.  No fever.  No incontinence.  No prior surgeries.  Has been taking flexeril and using icy hot without relief.  Review of Systems  Positive: Back pain, abdominal pain Negative: fever  Physical Exam  BP (!) 140/70   Pulse 68   Temp 98.5 F (36.9 C) (Oral)   Resp 18   Ht '4\' 11"'$  (1.499 m)   Wt 81.6 kg   LMP 06/14/2022   SpO2 99%   BMI 36.36 kg/m  Gen:   Awake, no distress   Resp:  Normal effort  MSK:   Moves extremities without difficulty  Other:    Medical Decision Making  Medically screening exam initiated at 10:30 PM.  Appropriate orders placed.  Alison Mendoza was informed that the remainder of the evaluation will be completed by another provider, this initial triage assessment does not replace that evaluation, and the importance of remaining in the ED until their evaluation is complete.  Back pain, abdominal pain.  No focal deficits noted in triage.  Will check labs, x-ray.   Larene Pickett, PA-C 07/27/22 2231

## 2022-07-27 NOTE — ED Triage Notes (Signed)
Spanish translator used for triage. Pt was at work at [employer]. Pt states she was lifting a pipe and felt something in her back. Since then pt has had pain in lower back into right leg. Pt c/o pain, denies numbness/tingling. Pt c/o abdominal pain. Pt seen for same and given meds, but have not helped. Pt denies bowel/bladder incontinence.

## 2022-07-28 MED ORDER — POTASSIUM CHLORIDE CRYS ER 20 MEQ PO TBCR
40.0000 meq | EXTENDED_RELEASE_TABLET | Freq: Once | ORAL | Status: AC
  Administered 2022-07-28: 40 meq via ORAL
  Filled 2022-07-28: qty 2

## 2022-07-28 MED ORDER — KETOROLAC TROMETHAMINE 30 MG/ML IJ SOLN
30.0000 mg | Freq: Once | INTRAMUSCULAR | Status: AC
  Administered 2022-07-28: 30 mg via INTRAVENOUS
  Filled 2022-07-28: qty 1

## 2022-07-28 MED ORDER — PREDNISONE 20 MG PO TABS: 40.0000 mg | ORAL_TABLET | Freq: Every day | ORAL | 0 refills | Status: DC

## 2022-07-28 MED ORDER — ACETAMINOPHEN 325 MG PO TABS
650.0000 mg | ORAL_TABLET | Freq: Once | ORAL | Status: AC
  Administered 2022-07-28: 650 mg via ORAL
  Filled 2022-07-28: qty 2

## 2022-07-28 MED ORDER — PREDNISONE 20 MG PO TABS
60.0000 mg | ORAL_TABLET | Freq: Once | ORAL | Status: AC
  Administered 2022-07-28: 60 mg via ORAL
  Filled 2022-07-28: qty 3

## 2022-07-28 MED ORDER — METHOCARBAMOL 1000 MG/10ML IJ SOLN
1000.0000 mg | Freq: Once | INTRAVENOUS | Status: AC
  Administered 2022-07-28: 1000 mg via INTRAVENOUS
  Filled 2022-07-28: qty 10

## 2022-07-28 NOTE — ED Provider Notes (Signed)
Pain is controlled at this time.  Patient was able to stand and walk to the bathroom independently.  In addition to the medication she already has we will also add steroids.  She was instructed to remain out of work for 1 week, given follow-up with orthopedist if symptoms do not improve and instructed to use heat, stretches and a brace in the future   Blanchie Dessert, MD 07/28/22 [phone]

## 2022-07-28 NOTE — ED Provider Notes (Signed)
[facility] EMERGENCY DEPARTMENT Provider Note   CSN: [id number] Arrival date & time: 07/27/22  2205     History  Chief Complaint  Patient presents with   Back Pain    Alison Mendoza is a 41 y.o. female.  The history is provided by the patient. A language interpreter was used.  Back Pain She lifted some objects at work last night and developed pain in her lower back radiating down her left leg.  She was seen at the medical department where she was told to take ibuprofen and given a prescription for cyclobenzaprine, but it is not giving her relief of pain.  She is complaining of some numbness in the left thigh, denies any weakness.  She denies bowel or bladder dysfunction.  She denies prior history of back pain.   Home Medications Prior to Admission medications   Medication Sig Start Date End Date Taking? Authorizing Provider  pantoprazole (PROTONIX) 40 MG tablet Take 1 tablet (40 mg total) by mouth daily. 07/03/22   Soyla Dryer, PA-C  promethazine (PHENERGAN) 25 MG tablet 1 po q 8 hour prn nausea.  1 tab por boca cada 8 horas cuando sea necesario para las nausea 04/02/19 04/20/19  Soyla Dryer, PA-C      Allergies    Patient has no known allergies.    Review of Systems   Review of Systems  Musculoskeletal:  Positive for back pain.  All other systems reviewed and are negative.   Physical Exam Updated Vital Signs BP 114/75 (BP Location: Left Arm)   Pulse 63   Temp 98.3 F (36.8 C) (Oral)   Resp 18   Ht '4\' 11"'$  (1.499 m)   Wt 81.6 kg   LMP 07/10/2022 (Exact Date)   SpO2 98%   BMI 36.36 kg/m  Physical Exam Vitals and nursing note reviewed.   41 year old female, resting comfortably and in no acute distress. Vital signs are normal. Oxygen saturation is 98%, which is normal. Head is normocephalic and atraumatic. PERRLA, EOMI. Oropharynx is clear. Neck is nontender and supple without adenopathy or JVD. Back is tender in the mid and lower  lumbar spine with moderate left paralumbar spasm.  Straight leg raise is positive on the left at 60 degrees and crossed straight leg raise on the right is positive at 60 degrees. Lungs are clear without rales, wheezes, or rhonchi. Chest is nontender. Heart has regular rate and rhythm without murmur. Abdomen is soft, flat, nontender. Extremities have no cyanosis or edema, full range of motion is present. Skin is warm and dry without rash. Neurologic: Mental status is normal, cranial nerves are intact.  There are no objective motor or sensory deficits in the legs.  ED Results / Procedures / Treatments   Labs (all labs ordered are listed, but only abnormal results are displayed) Labs Reviewed  COMPREHENSIVE METABOLIC PANEL - Abnormal; Notable for the following components:      Result Value   Potassium 3.4 (*)    Glucose, Bld 118 (*)    All other components within normal limits  URINALYSIS, ROUTINE W REFLEX MICROSCOPIC - Abnormal; Notable for the following components:   APPearance HAZY (*)    Leukocytes,Ua SMALL (*)    Bacteria, UA RARE (*)    All other components within normal limits  CBC WITH DIFFERENTIAL/PLATELET  LIPASE, BLOOD  I-STAT BETA HCG BLOOD, ED ([facility], [facility], [facility] ONLY)   Radiology DG Lumbar Spine Complete  Result Date: 07/27/2022 CLINICAL DATA:  Back pain,  injury at work. EXAM: LUMBAR SPINE - COMPLETE 4+ VIEW COMPARISON:  None Available. FINDINGS: There are 5 non-rib-bearing lumbar vertebra. Straightening of normal lordosis without listhesis. Normal vertebral body heights. No evidence of acute fracture. Minimal anterior spurring at L3-L4 with preservation of disc space. No pars defects or focal bone abnormality. IMPRESSION: 1. No acute osseous abnormality of the lumbar spine. 2. Minimal spondylosis with anterior spurring, preservation of disc space. Electronically Signed   By: Keith Rake M.D.   On: 07/27/2022 23:52    Procedures Procedures    Medications Ordered in  ED Medications  methocarbamol (ROBAXIN) 1,000 mg in dextrose 5 % 100 mL IVPB (has no administration in time range)  potassium chloride SA (KLOR-CON M) CR tablet 40 mEq (has no administration in time range)  oxyCODONE-acetaminophen (PERCOCET/ROXICET) 5-325 MG per tablet 1 tablet (1 tablet Oral Given 07/27/22 2301)  ketorolac (TORADOL) 30 MG/ML injection 30 mg (30 mg Intravenous Given 07/28/22 0705)  acetaminophen (TYLENOL) tablet 650 mg (650 mg Oral Given 07/28/22 0703)  predniSONE (DELTASONE) tablet 60 mg (60 mg Oral Given 07/28/22 0703)    ED Course/ Medical Decision Making/ A&P                           Medical Decision Making Risk OTC drugs. Prescription drug management.   Acute low back pain with left-sided sciatica.  No red flags to suggest serious pathology such as cauda equina syndrome.  X-rays ordered at triage shows some degenerative changes but no acute process.  I have independently viewed the images, and agree with the radiologist's interpretation.  I have ordered intravenous ketorolac and methocarbamol, oral prednisone and acetaminophen.  I have reviewed and interpreted laboratory tests ordered at triage and my interpretation is mild hypokalemia, mildly elevated random glucose level, normal CBC, unremarkable urinalysis.  Case is signed out to Dr. Maryan Rued.  Final Clinical Impression(s) / ED Diagnoses Final diagnoses:  Acute left-sided low back pain with left-sided sciatica  Hypokalemia  Elevated random blood glucose level    Rx / DC Orders ED Discharge Orders     None         Delora Fuel, MD 35/67/01 [phone]

## 2022-10-25 ENCOUNTER — Ambulatory Visit: Payer: Self-pay | Admitting: Physician Assistant

## 2022-10-25 ENCOUNTER — Encounter: Payer: Self-pay | Admitting: Physician Assistant

## 2022-10-25 VITALS — BP 108/65 | HR 84 | Temp 98.6°F | Ht 59.0 in | Wt 186.8 lb

## 2022-10-25 DIAGNOSIS — G8929 Other chronic pain: Secondary | ICD-10-CM

## 2022-10-25 DIAGNOSIS — Z789 Other specified health status: Secondary | ICD-10-CM

## 2022-10-25 DIAGNOSIS — Z1239 Encounter for other screening for malignant neoplasm of breast: Secondary | ICD-10-CM

## 2022-10-25 DIAGNOSIS — E669 Obesity, unspecified: Secondary | ICD-10-CM

## 2022-10-25 DIAGNOSIS — H11003 Unspecified pterygium of eye, bilateral: Secondary | ICD-10-CM

## 2022-10-25 DIAGNOSIS — K219 Gastro-esophageal reflux disease without esophagitis: Secondary | ICD-10-CM

## 2022-10-25 DIAGNOSIS — R059 Cough, unspecified: Secondary | ICD-10-CM

## 2022-10-25 NOTE — Patient Instructions (Signed)
Cmo usar Educational psychologist con medidor de dosis How to Use a Metered Dose Inhaler  Un inhalador con medidor de dosis (IMD) es un dispositivo porttil lleno de un medicamento que se debe aspirar hacia los pulmones (inhalar). El medicamento se administra presionando un envase metlico. Esto libera una cantidad predeterminada de aerosol y vapor a travs de la boca [employer]. Cada envase de un IMD tiene cierto nmero de dosis (descargas). Para que entre ms [employer], se puede recomendar el uso de un espaciador con el inhalador con medidor de dosis. Un espaciador es un tubo plstico que se conecta al IMD en un extremo y tiene una boquilla en el otro extremo. El Tour manager en el tubo durante un tiempo breve. Esto permite que se inhale ms cantidad de medicamento. El IMD se puede usar para Architectural technologist muchos medicamentos inhalados, entre los que se incluyen: Medicamentos de rescate o de alivio rpido, como broncodilatadores. Medicamentos de control, como corticoesteroides. Cules son los riesgos? Si no [place] el [place], es posible que el medicamento no llegue a los pulmones para ayudarlo a Ambulance person. Si no tiene la fuerza suficiente para presionar el envase para generar el aerosol, pregntele al mdico cmo puede resolverlo. El medicamento del IMD puede causar efectos secundarios, por ejemplo: Llagas en la boca (candidiasis). Tos. Ronquera. Temblores. Dolor de [place]. Materiales necesarios: Patent examiner de dosis. Espaciador, si se recomienda. Cmo usar Educational psychologist con medidor de dosis sin un espaciador  Retire la tapa del Tax inspector. Si es la primera vez que [place] el inhalador, agtelo durante 5 segundos y dispare 4 descargas al aire lejos del rostro. Esto se llama imprimacin. Sacuda el inhalador durante 5 segundos. Coloque el inhalador de modo que la parte superior del envase quede [place]. Coloque el dedo ndice en la  parte superior del envase del medicamento. Sostenga la parte inferior del inhalador con su pulgar. Exhale normalmente todo el aire que pueda, alejado del [place]. Coloque el [employer] dientes y apriete los labios fuertemente alrededor de la boquilla, o sostenga el inhalador a una distancia de 1 a 2 pulgadas (2,5 a 5 cm) de Software engineer. Mantenga la lengua baja despejando el paso. Si no est seguro de Copywriter, advertising, consulte a su mdico. Presione el envase hacia abajo con el dedo ndice para Product/process development scientist. Inhale profunda y lentamente por la boca hasta que los pulmones se llenen por completo. No respire por la nariz. La inhalacin debe llevarle de 4 a 6 segundos. Mantenga el medicamento en los pulmones entre 5 y 10 segundos (10 segundos es lo ms indicado). Esto ayuda a que el medicamento ingrese en las vas respiratorias pequeas y en los pulmones. Retire el inhalador de la boca, gire la cabeza y exhale normalmente. Espere al menos 1 minuto entre una descarga y [place] o segn las indicaciones. Luego, repita los pasos 3 al 10 hasta que se haya administrado la cantidad de descargas que el mdico le indic. Coloque la Biomedical scientist. Si utiliza Educational psychologist con corticoesteroides, enjuague su boca con agua, haga grgaras y escupa el agua. No trague el agua. Cmo usar Educational psychologist de dosis medida con espaciador  Retire la tapa del Tax inspector. Si es la primera vez que [place] el inhalador, agtelo durante 5 segundos y dispare 4 descargas al aire lejos del rostro. Esto se llama imprimacin. Sacuda el inhalador durante 5 segundos. Coloque el extremo abierto del Geographical information systems officer en la boquilla  del inhalador. Coloque el inhalador de modo que la parte superior del envase quede hacia arriba y la boquilla del espaciador delante suyo. Coloque el dedo ndice en la parte superior del envase del medicamento. Sostenga la parte inferior del inhalador y Arts development officer con su pulgar. Exhale  normalmente todo el aire que pueda, lejos del espaciador. Coloque el [employer] dientes y apriete los labios firmemente a su alrededor. Mantenga la lengua baja despejando el paso. Presione el envase hacia abajo con el dedo ndice para Product/process development scientist, luego inhale lenta y profundamente por la boca hasta que los pulmones se llenen por completo. No respire por la nariz. La inhalacin debe llevarle de 4 a 6 segundos. Mantenga el medicamento en los pulmones entre 5 y 10 segundos (10 segundos es lo ms indicado). Esto ayuda a que el medicamento ingrese en las vas respiratorias pequeas y en los pulmones. Retire el espaciador de la boca, gire la cabeza y exhale normalmente. Espere al menos 1 minuto entre una descarga y [place] o segn las indicaciones. Luego, repita los pasos 3 al 11 hasta que se haya administrado la cantidad de descargas que el mdico le indic. Retire Arts development officer del Tax inspector y colquele la tapa al Tax inspector. Si utiliza Educational psychologist con corticoesteroides, enjuague su boca con agua, haga grgaras y escupa el agua. No trague el agua. Siga estas instrucciones en su casa: Cuidado del IMD Guarde el inhalador a temperatura ambiente o una temperatura cercana a esta. Si el IMD est fro, no funciona correctamente. Siga las indicaciones del prospecto que trae el envase para el cuidado y la limpieza del IMD y del Geographical information systems officer. Instrucciones generales [place] el medicamento para inhalar solo como se lo haya indicado el mdico. No use el inhalador ms de lo que su mdico le indique. Vuelva a llenar el IMD con medicamento antes de que se hayan utilizado todas las dosis predeterminadas. Si el inhalador tiene Administrator, arts, consltelo para saber la cantidad de [employer]. El nmero que se ve indica cuntas dosis quedan. Si el inhalador no tiene Scientist, physiological, pregntele al mdico cundo Engineering geologist a llenarlo. Luego, anote la fecha en que deber llenarlo en un almanaque o en el  envase del IMD. Recuerde que no es posible saber cundo un inhalador est vaco con solo agitarlo. Es posible que sienta u oiga algo en el envase incluso cuando se hayan usado todas las dosis de medicamento predeterminadas. Es importante llevar un registro de las dosis. No consuma ningn producto que contenga nicotina o tabaco, como cigarrillos, cigarrillos electrnicos y tabaco de Higher education careers adviser. Si necesita ayuda para dejar de fumar, consulte al mdico. Concurra a todas las visitas de seguimiento como se lo haya indicado el mdico. Esto es importante. Dnde buscar ms informacin Centers for Disease Control and Prevention (Centros para el Control y la Prevencin de Arboriculturist): http://www.wolf.info/ American Lung Association (Asociacin Estadounidense del Pulmn): www.lung.org Comunquese con un mdico si: Los sntomas solo se Engineer, mining con Forensic psychologist. Tiene dificultad para Water quality scientist. El medicamento le causa efectos secundarios. Siente escalofros o tiene fiebre. Transpira durante la noche. Hay sangre en la saliva espesa (flema). Solicite ayuda de inmediato si: Tiene mareos. Tiene la frecuencia cardaca acelerada. Le falta el aire de manera preocupante. Tiene dificultad para respirar. Estos sntomas pueden representar un problema grave que constituye Engineer, maintenance (IT). No espere a ver si los sntomas desaparecen. Solicite atencin mdica de inmediato. Comunquese con el servicio de emergencias de su localidad (911 en los  Estados [place]). No conduzca por sus propios medios [employer] hospital. Resumen Un inhalador con medidor de dosis es un dispositivo porttil para tomar medicamentos que se deben Occupational psychologist los pulmones (inhalar). Tome el medicamento para Advice worker solo como se lo haya indicado el mdico. No use el inhalador ms de lo que su mdico le indique. No es posible saber cundo un inhalador est vaco con solo agitarlo. Vuelva a llenarlo con medicamento antes de que se hayan  utilizado todas las dosis predeterminadas. Siga las indicaciones del prospecto que trae el envase para el cuidado y la limpieza del IMD y del Geographical information systems officer. Esta informacin no tiene Marine scientist el consejo del mdico. Asegrese de hacerle al mdico cualquier pregunta que tenga. Document Revised: 12/29/2019 Document Reviewed: 12/29/2019 Elsevier Patient Education  [place].

## 2022-10-25 NOTE — Progress Notes (Signed)
BP 108/65   Pulse 84   Temp 98.6 F (37 C)   SpO2 97%    Subjective:    Patient ID: Alison Mendoza, female    DOB: Oct 19, 1980, 42 y.o.   MRN: [id number]  HPI: Alison Mendoza is a 42 y.o. female presenting on 10/25/2022 for Follow-up and Cough (For the past 2 months from when she was last sick. Pt states she has cough episode about twice a day in which she coughs a lot. )   HPI   Chief Complaint  Patient presents with   Follow-up   Cough    For the past 2 months from when she was last sick. Pt states she has cough episode about twice a day in which she coughs a lot.      Pt is in for routine follow up.    Pt was seen by eye doctor for pterygium in December  Pt has follow up appointment with GI next week.  She says she was unaware of GI appointment.  She reports cough that she has had for 2 months.   She thinks she has wheezes.  No fever.  + chills.  Her feet are cold.   She did not get covid vaccination.  She is a lifelong non-smoker.     Relevant past medical, surgical, family and social history reviewed and updated as indicated. Interim medical history since our last visit reviewed. Allergies and medications reviewed and updated.    Current Outpatient Medications:    pantoprazole (PROTONIX) 40 MG tablet, Take 1 tablet (40 mg total) by mouth daily., Disp: 30 tablet, Rfl: 0    Review of Systems  Per HPI unless specifically indicated above     Objective:    BP 108/65   Pulse 84   Temp 98.6 F (37 C)   SpO2 97%   Wt Readings from Last 3 Encounters:  07/27/22 180 lb (81.6 kg)  07/26/22 184 lb 4.8 oz (83.6 kg)  07/03/22 186 lb (84.4 kg)    Physical Exam Vitals reviewed.  Constitutional:      General: She is not in acute distress.    Appearance: She is well-developed. She is obese.  HENT:     Head: Normocephalic and atraumatic.  Cardiovascular:     Rate and Rhythm: Normal rate and regular rhythm.  Pulmonary:     Effort: Pulmonary effort is  normal. No respiratory distress.     Breath sounds: Normal breath sounds. No stridor. No wheezing or rhonchi.  Abdominal:     General: Bowel sounds are normal.     Palpations: Abdomen is soft. There is no mass.     Tenderness: There is abdominal tenderness in the epigastric area. There is no guarding or rebound.  Musculoskeletal:     Cervical back: Neck supple.     Right lower leg: No edema.     Left lower leg: No edema.  Lymphadenopathy:     Cervical: No cervical adenopathy.  Skin:    General: Skin is warm and dry.  Neurological:     Mental Status: She is alert and oriented to person, place, and time.  Psychiatric:        Behavior: Behavior normal.            Assessment & Plan:    Encounter Diagnoses  Name Primary?   Gastroesophageal reflux disease, unspecified whether esophagitis present Yes   Chronic abdominal pain    Cough, unspecified type    Pterygium eye, bilateral  Obesity, unspecified classification, unspecified obesity type, unspecified whether serious comorbidity present    Not proficient in English language    Encounter for screening for malignant neoplasm of breast, unspecified screening modality      HCM- -refer for screening Mammogram which is due in  mid May  GERD/chronic abdominal pain -GI as scheduled  Cough -discussed with pt that could be viral.  Could also be related to GERD.  She was given sample proair with instructions on how to use it.  She is to RTO if cough persists

## 2022-11-03 ENCOUNTER — Encounter: Payer: Self-pay | Admitting: Physician Assistant

## 2022-11-03 ENCOUNTER — Ambulatory Visit (INDEPENDENT_AMBULATORY_CARE_PROVIDER_SITE_OTHER): Payer: Self-pay | Admitting: Physician Assistant

## 2022-11-03 VITALS — BP 122/76 | HR 72 | Ht 59.0 in | Wt 188.1 lb

## 2022-11-03 DIAGNOSIS — R1011 Right upper quadrant pain: Secondary | ICD-10-CM

## 2022-11-03 DIAGNOSIS — R110 Nausea: Secondary | ICD-10-CM

## 2022-11-03 DIAGNOSIS — R1030 Lower abdominal pain, unspecified: Secondary | ICD-10-CM

## 2022-11-03 DIAGNOSIS — R1013 Epigastric pain: Secondary | ICD-10-CM

## 2022-11-03 DIAGNOSIS — R194 Change in bowel habit: Secondary | ICD-10-CM

## 2022-11-03 MED ORDER — NA SULFATE-K SULFATE-MG SULF 17.5-3.13-1.6 GM/177ML PO SOLN: ORAL | 0 refills | Status: DC

## 2022-11-03 NOTE — Patient Instructions (Addendum)
Le han programado una endoscopia y Ardelia Mems colonoscopia. Siga las instrucciones escritas que se le dieron en su visita de hoy. Recoja sus suministros de preparacin en la farmacia dentro de los prximos 1 a 3 das. Si [place] inhaladores (incluso solo cuando sea necesario), trigalos con usted el da de su procedimiento.  Hemos enviado los siguientes medicamentos a su farmacia para que los recoja cuando le [place]: SuPrep  _______________________________________________________  If your blood pressure at your visit was 140/90 or greater, please contact your primary care physician to follow up on this.  _______________________________________________________  If you are age 43 or older, your body mass index should be between 23-30. Your Body mass index is 38 kg/m. If this is out of the aforementioned range listed, please consider follow up with your Primary Care Provider.  If you are age 48 or younger, your body mass index should be between 19-25. Your Body mass index is 38 kg/m. If this is out of the aformentioned range listed, please consider follow up with your Primary Care Provider.   ________________________________________________________  The [facility] providers would like to encourage you to use [facility] to communicate with providers for non-urgent requests or questions.  Due to long hold times on the telephone, sending your provider a message by [facility] may be a faster and more efficient way to get a response.  Please allow 48 business hours for a response.  Please remember that this is for non-urgent requests.  _______________________________________________________   Adella Nissen atencin y [place].  Ellouise Newer PA-C

## 2022-11-03 NOTE — Progress Notes (Signed)
Chief Complaint: Follow-up right upper quadrant pain and nausea  HPI:    Alison Mendoza is a Spanish-speaking female with a past medical history as listed below, known to Dr. Carlean Purl, who presents to clinic today for follow-up of right upper quadrant pain.    05/16/2019 colonoscopy for right lower quadrant pain with a single solitary ulcer in the terminal ileum exam is otherwise normal.  Biopsies consistent with lymphocytic colitis from random sites.  Terminal ileum biopsy was normal.  Patient started on Bentyl 20 mg p.o. 4 times daily 30 minutes AC.  She was given a Prednisone taper 5 mg tabs 4/day x 1 week then 3/day x 1 week then 2/day x 1 days then 1/day x 1 week.     05/16/2019 EGD with normal esophagus, stomach and duodenum.    09/28/2021 office visit with Alison Mendoza.  At that time discussed that she had previously had a CT scan, ultrasound, EGD and colonoscopy.  At that time pain started mostly musculoskeletal.  Previously given Bentyl which she was restarted on 10 mg 3 times daily and continued on Pantoprazole 40 mg daily as well as scheduled for HIDA scan.  Gust possible colonoscopy to evaluate if pain continued.    10/06/2021 HIDA scan with [facility] showed patent cystic and common bile ducts, low normal gallbladder ejection fraction.  At that time was recommended she have an EGD and colonoscopy with Dr. Carlean Purl and if those are unremarkable then referral to surgery to consider cholecystectomy.  She was scheduled for EGD and colonoscopy 11/22/2021 with Dr. Carlean Purl, she never had these done.     Today, the patient tells me that she never had the EGD and colonoscopy done because they expected her to pay up front and she did not have the money.  She has since reapplied for assistance and has been approved.  Tells me that her symptoms continue much similar to when she was seen in clinic previously.  Chronic right upper quadrant abdominal pain which is there all the time rated as a 4-5/10 and sometimes  worse.  This is made worse by eating, sometimes when she eats she gets so nauseous that she vomits.  This happens maybe 2-3 times a week.  Otherwise remains constantly nauseous.  No real help from the medication she was given in the past, currently not taking anything that I can tell including her Pantoprazole or Zofran.    New for her is a lower abdominal pain.  Previously she was having some issues with diarrhea which has cleared up, but this pain is almost constant and cramping in nature.  No blood in her stool.    Denies fever, chills or weight loss.   Past Medical History:  Diagnosis Date   Abnormal Pap smear    Headache(784.0)    History of worms    3 yrs ago   Lymphocytic colitis 05/23/2019   Salmonella 2006    Past Surgical History:  Procedure Laterality Date   CESAREAN SECTION  2003   done in [place]; vertical ext scar; unknown int. scar   COLPOSCOPY      Current Outpatient Medications  Medication Sig Dispense Refill   pantoprazole (PROTONIX) 40 MG tablet Take 1 tablet (40 mg total) by mouth daily. 30 tablet 0   No current facility-administered medications for this visit.    Allergies as of 11/03/2022   (No Known Allergies)    Family History  Problem Relation Age of Onset   Lung disease Mother  Diabetes Sister    Diabetes Paternal Aunt    Colon cancer Neg Hx    Esophageal cancer Neg Hx    Rectal cancer Neg Hx    Stomach cancer Neg Hx    Breast cancer Neg Hx     Social History   Socioeconomic History   Marital status: Significant Other    Spouse name: Not on file   Number of children: 2   Years of education: Not on file   Highest education level: 4th grade  Occupational History   Not on file  Tobacco Use   Smoking status: Never   Smokeless tobacco: Never  Vaping Use   Vaping Use: Never used  Substance and Sexual Activity   Alcohol use: Yes    Comment: rarely   Drug use: No   Sexual activity: Yes    Birth control/protection: Condom  Other Topics  Concern   Not on file  Social History Narrative   Boyfriend/Husband live w/ daughters   Plumber's helper   fromi [place]      Daughter 2015   Daughter 2003      Never smoker, no drugs, rare EtOH         Social Determinants of Health   Financial Resource Strain: Not on file  Food Insecurity: No Food Insecurity (02/24/2022)   Hunger Vital Sign    Worried About Running Out of Food in the Last Year: Never true    Ran Out of Food in the Last Year: Never true  Transportation Needs: No Transportation Needs (02/24/2022)   PRAPARE - Hydrologist (Medical): No    Lack of Transportation (Non-Medical): No  Physical Activity: Not on file  Stress: Not on file  Social Connections: Not on file  Intimate Partner Violence: Not on file    Review of Systems:    Constitutional: No weight loss, fever or chills Cardiovascular: No chest pain Respiratory: No SOB  Gastrointestinal: See HPI and otherwise negative   Physical Exam:  Vital signs: BP 122/76 (BP Location: Left Arm, Patient Position: Sitting, Cuff Size: Large)   Pulse 72   Ht 4' 11"$  (1.499 m)   Wt 188 lb 2 oz (85.3 kg)   LMP 10/30/2022   BMI 38.00 kg/m    Constitutional:   Pleasant Hispanic female appears to be in NAD, Well developed, Well nourished, alert and cooperative Respiratory: Respirations even and unlabored. Lungs clear to auscultation bilaterally.   No wheezes, crackles, or rhonchi.  Cardiovascular: Normal S1, S2. No MRG. Regular rate and rhythm. No peripheral edema, cyanosis or pallor.  Gastrointestinal:  Soft, nondistended, moderate RUQ/Epigastric ttp with involuntary guarding, mild-mod b/l lower abdominal ttp,. No rebound or guarding. Normal bowel sounds. No appreciable masses or hepatomegaly. Rectal:  Not performed.  Psychiatric: Oriented to person, place and time. Demonstrates good judgement and reason without abnormal affect or behaviors.  RELEVANT LABS AND IMAGING: CBC    Component  Value Date/Time   WBC 8.9 07/27/2022 2237   RBC 4.50 07/27/2022 2237   HGB 12.0 07/27/2022 2237   HCT 38.5 07/27/2022 2237   PLT 238 07/27/2022 2237   MCV 85.6 07/27/2022 2237   MCH 26.7 07/27/2022 2237   MCHC 31.2 07/27/2022 2237   RDW 14.5 07/27/2022 2237   LYMPHSABS 2.5 07/27/2022 2237   MONOABS 0.8 07/27/2022 2237   EOSABS 0.2 07/27/2022 2237   BASOSABS 0.0 07/27/2022 2237    CMP     Component Value Date/Time   NA 140  07/27/2022 2237   K 3.4 (L) 07/27/2022 2237   CL 109 07/27/2022 2237   CO2 23 07/27/2022 2237   GLUCOSE 118 (H) 07/27/2022 2237   BUN 12 07/27/2022 2237   CREATININE 0.99 07/27/2022 2237   CREATININE 0.58 12/29/2015 0831   CALCIUM 8.9 07/27/2022 2237   PROT 7.0 07/27/2022 2237   ALBUMIN 3.6 07/27/2022 2237   AST 21 07/27/2022 2237   ALT 23 07/27/2022 2237   ALKPHOS 97 07/27/2022 2237   BILITOT 0.3 07/27/2022 2237   GFRNONAA >60 07/27/2022 2237   GFRNONAA >89 12/29/2015 0831   GFRAA >60 04/19/2019 2325   GFRAA >89 12/29/2015 0831    Assessment: 1.  Chronic right upper quadrant pain: Previous evaluation with EGD/colonoscopy in 2020 as well as CT x 2 and ultrasound as well as HIDA scan which did show a low normal ejection fraction, this pain is associated with nausea and worse with eating; consider gallbladder etiology versus gastric 2.  Lower abdominal pain: More new for the patient, cramping in nature, seems to be having more normal bowel movements which is a change for her; consider IBS versus other 3.  Change in bowel habits: As above  Plan: 1.  Scheduled patient for EGD and colonoscopy as previously recommended.  Scheduled with Dr. Carlean Purl in the [facility].  Did provide the patient a detailed list risks for the procedures and she agrees to proceed. Patient is appropriate for endoscopic procedure(s) in the ambulatory ([place]) setting.  2.  At the moment we will hold off on medications as they have not seem to help in the past, though pending results may  benefit from a PPI. 3.  Patient to follow in clinic per recommendations from Dr. Carlean Purl after time of procedure.  Alison Newer, PA-C [facility] 11/03/2022, 2:08 PM  Cc: Soyla Dryer, PA-C

## 2022-11-10 ENCOUNTER — Inpatient Hospital Stay: Payer: Self-pay | Attending: Obstetrics and Gynecology | Admitting: Hematology and Oncology

## 2022-11-10 VITALS — BP 125/76 | Wt 189.4 lb

## 2022-11-10 DIAGNOSIS — N6311 Unspecified lump in the right breast, upper outer quadrant: Secondary | ICD-10-CM

## 2022-11-10 NOTE — Patient Instructions (Signed)
Taught Alison Mendoza how to perform BSE and gave educational materials to take home. Patient did not need a Pap smear today due to last Pap smear was in 2021 per patient. Let her know [facility] will cover Pap smears every 3 years unless has a history of abnormal Pap smears. Referred patient to the [facility] for diagnostic mammogram. Patient aware of appointment and will be there. Let patient know will follow up with her within the next couple weeks with results. Pervis Hocking verbalized understanding.  Melodye Ped, NP 10:27 AM

## 2022-11-10 NOTE — Progress Notes (Signed)
Alison Mendoza is a 42 y.o. female who presents to [facility] clinic today with complaint of right breast mass with pain.    Pap Smear: Pap not smear completed today. Last Pap smear was 2021 at [facility] clinic and was normal. Per patient has no history of an abnormal Pap smear. Last Pap smear result is not available in Epic.   Physical exam: Breasts Breasts symmetrical. No skin abnormalities bilateral breasts. No nipple retraction bilateral breasts. No nipple discharge bilateral breasts. No lymphadenopathy. No lumps palpated left breasts. Right breast with mass noted at 10-11 o'clock approximately 3 cm.  MM CLIP PLACEMENT RIGHT  Result Date: 03/07/2022 CLINICAL DATA:  Post ultrasound-guided biopsy of a mass in the right breast at the 10 o'clock position. EXAM: 3D DIAGNOSTIC RIGHT MAMMOGRAM POST ULTRASOUND BIOPSY COMPARISON:  Previous exam(s). FINDINGS: 3D Mammographic images were obtained following ultrasound-guided core biopsy of a mass in the right breast at the 10 o'clock position. A ribbon shaped biopsy marking clip is present at the site of the biopsied mass in the right breast at the 10 o'clock position. IMPRESSION: Ribbon shaped biopsy marking clip at site of biopsied mass in the right breast at the 10 o'clock position. Final Assessment: Post Procedure Mammograms for Marker Placement Electronically Signed   By: Everlean Alstrom M.D.   On: 03/07/2022 09:09  MS DIGITAL SCREENING TOMO BILATERAL  Result Date: 01/31/2022 CLINICAL DATA:  Screening. EXAM: DIGITAL SCREENING BILATERAL MAMMOGRAM WITH TOMOSYNTHESIS AND CAD TECHNIQUE: Bilateral screening digital craniocaudal and mediolateral oblique mammograms were obtained. Bilateral screening digital breast tomosynthesis was performed. The images were evaluated with computer-aided detection. COMPARISON:  Previous exam(s). ACR Breast Density Category c: The breast tissue is heterogeneously dense, which may obscure small masses. FINDINGS: In the right breast,  a possible mass warrants further evaluation. In the left breast, no findings suspicious for malignancy. IMPRESSION: Further evaluation is suggested for possible mass in the right breast. RECOMMENDATION: Ultrasound of the right breast. (Code:US-R-28M) The patient will be contacted regarding the findings, and additional imaging will be scheduled. BI-RADS CATEGORY  0: Incomplete. Need additional imaging evaluation and/or prior mammograms for comparison. Electronically Signed   By: Lajean Manes M.D.   On: 01/31/2022 11:41  MS DIGITAL SCREENING TOMO BILATERAL  Result Date: 11/08/2020 CLINICAL DATA:  Screening. EXAM: DIGITAL SCREENING BILATERAL MAMMOGRAM WITH TOMOSYNTHESIS AND CAD TECHNIQUE: Bilateral screening digital craniocaudal and mediolateral oblique mammograms were obtained. Bilateral screening digital breast tomosynthesis was performed. The images were evaluated with computer-aided detection. COMPARISON:  None. ACR Breast Density Category c: The breast tissue is heterogeneously dense, which may obscure small masses FINDINGS: There are no findings suspicious for malignancy. IMPRESSION: No mammographic evidence of malignancy. A result letter of this screening mammogram will be mailed directly to the patient. RECOMMENDATION: Screening mammogram in one year. (Code:SM-B-01Y) BI-RADS CATEGORY  1: Negative. Electronically Signed   By: Nolon Nations M.D.   On: 11/08/2020 13:30        Pelvic/Bimanual Pap is not indicated today    Smoking History: Patient has never smoked and was not referred to quit line.    Patient Navigation: Patient education provided. Access to services provided for patient through [place] interpreter provided. No transportation provided   Colorectal Cancer Screening: Per patient has never had colonoscopy completed No complaints today.    Breast and Cervical Cancer Risk Assessment: Patient does not have family history of breast cancer, known genetic mutations, or  radiation treatment to the chest before age 89. Patient does  not have history of cervical dysplasia, immunocompromised, or DES exposure in-utero.  Risk Assessment   No risk assessment data for the current encounter  Risk Scores       02/24/2022   Last edited by: Demetrius Revel, LPN   5-year risk: 0.4 %   Lifetime risk: 5.7 %            A: [facility] exam without pap smear Complaint of right breast mass with pain noted upon exam to be in the same area as biopsied last year and found to be negative for malignancy. Will send for diagnostic imaging.   P: [facility]. Appointment scheduled.  Melodye Ped, NP 11/10/2022 10:27 AM

## 2022-11-30 ENCOUNTER — Encounter: Payer: Self-pay | Admitting: Internal Medicine

## 2022-11-30 ENCOUNTER — Ambulatory Visit (AMBULATORY_SURGERY_CENTER): Payer: Self-pay | Admitting: Internal Medicine

## 2022-11-30 VITALS — BP 135/75 | HR 65 | Temp 98.2°F | Resp 12 | Ht 59.0 in | Wt 188.0 lb

## 2022-11-30 DIAGNOSIS — R1011 Right upper quadrant pain: Secondary | ICD-10-CM

## 2022-11-30 DIAGNOSIS — B9681 Helicobacter pylori [H. pylori] as the cause of diseases classified elsewhere: Secondary | ICD-10-CM

## 2022-11-30 DIAGNOSIS — D120 Benign neoplasm of cecum: Secondary | ICD-10-CM

## 2022-11-30 DIAGNOSIS — K2950 Unspecified chronic gastritis without bleeding: Secondary | ICD-10-CM

## 2022-11-30 DIAGNOSIS — K2970 Gastritis, unspecified, without bleeding: Secondary | ICD-10-CM

## 2022-11-30 DIAGNOSIS — K5140 Inflammatory polyps of colon without complications: Secondary | ICD-10-CM

## 2022-11-30 DIAGNOSIS — R194 Change in bowel habit: Secondary | ICD-10-CM

## 2022-11-30 MED ORDER — SODIUM CHLORIDE 0.9 % IV SOLN: 500.0000 mL | Freq: Once | INTRAVENOUS | Status: DC

## 2022-11-30 NOTE — Op Note (Signed)
[facility] Patient Name: Alison Mendoza Procedure Date: 11/30/2022 2:46 PM MRN: [id number] Endoscopist: Gatha Mayer , MD, [id number] Age: 42 Referring MD:  Date of Birth: 11-25-1980 Gender: Female Account #: [id number] Procedure:                Upper GI endoscopy Indications:              Abdominal pain in the right upper quadrant Medicines:                Monitored Anesthesia Care Procedure:                Pre-Anesthesia Assessment:                           - Prior to the procedure, a History and Physical                            was performed, and patient medications and                            allergies were reviewed. The patient's tolerance of                            previous anesthesia was also reviewed. The risks                            and benefits of the procedure and the sedation                            options and risks were discussed with the patient.                            All questions were answered, and informed consent                            was obtained. Prior Anticoagulants: The patient has                            taken no anticoagulant or antiplatelet agents. ASA                            Grade Assessment: II - A patient with mild systemic                            disease. After reviewing the risks and benefits,                            the patient was deemed in satisfactory condition to                            undergo the procedure.                           After obtaining informed consent, the endoscope was  passed under direct vision. Throughout the                            procedure, the patient's blood pressure, pulse, and                            oxygen saturations were monitored continuously. The                            GIF [id number] was introduced through the                            mouth, and advanced to the second part of duodenum.                            The  upper GI endoscopy was accomplished without                            difficulty. The patient tolerated the procedure                            well. Scope In: Scope Out: Findings:                 The examined esophagus was normal.                           Patchy mildly erythematous mucosa without bleeding                            was found in the entire examined stomach. Biopsies                            were taken with a cold forceps for histology.                            Verification of patient identification for the                            specimen was done. Estimated blood loss was minimal.                           The cardia and gastric fundus were normal on                            retroflexion.                           The examined duodenum was normal. Complications:            No immediate complications. Estimated Blood Loss:     Estimated blood loss was minimal. Impression:               - Normal esophagus.                           -  Erythematous mucosa in the stomach. Biopsied.                           - Normal examined duodenum. Recommendation:           - Patient has a contact number available for                            emergencies. The signs and symptoms of potential                            delayed complications were discussed with the                            patient. Return to normal activities tomorrow.                            Written discharge instructions were provided to the                            patient.                           - Await pathology results.                           - See the other procedure note for documentation of                            additional recommendations. Gatha Mayer, MD 11/30/2022 3:29:37 PM This report has been signed electronically.

## 2022-11-30 NOTE — Progress Notes (Signed)
Called to room to assist during endoscopic procedure.  Patient ID and intended procedure confirmed with present staff. Received instructions for my participation in the procedure from the performing physician.  

## 2022-11-30 NOTE — Progress Notes (Signed)
History and Physical Interval Note:  11/30/2022 2:45 PM  Alison Mendoza  has presented today for endoscopic procedure(s), with the diagnosis of  Encounter Diagnoses  Name Primary?   RUQ abdominal pain Yes   Change in bowel habits   .  The various methods of evaluation and treatment have been discussed with the patient and/or family. After consideration of risks, benefits and other options for treatment, the patient has consented to  the endoscopic procedure(s).   The patient's history has been reviewed, patient examined, no change in status, stable for endoscopic procedure(s).  I have reviewed the patient's chart and labs.  Questions were answered to the patient's satisfaction.     Gatha Mayer, MD, [facility]

## 2022-11-30 NOTE — Progress Notes (Signed)
Pt interviewed with the help of interpreter Dub Mikes.

## 2022-11-30 NOTE — Progress Notes (Signed)
Uneventful anesthetic. Report to pacu rn. Vss. Care resumed by rn. 

## 2022-11-30 NOTE — Op Note (Signed)
[facility] Patient Name: Alison Mendoza Procedure Date: 11/30/2022 2:46 PM MRN: [id number] Endoscopist: Gatha Mayer , MD, [phone] Age: 42 Referring MD:  Date of Birth: 15-Jun-1981 Gender: Female Account #: [id number] Procedure:                Colonoscopy Indications:              Lower abdominal pain, Change in bowel habits Medicines:                Monitored Anesthesia Care Procedure:                Pre-Anesthesia Assessment:                           - Prior to the procedure, a History and Physical                            was performed, and patient medications and                            allergies were reviewed. The patient's tolerance of                            previous anesthesia was also reviewed. The risks                            and benefits of the procedure and the sedation                            options and risks were discussed with the patient.                            All questions were answered, and informed consent                            was obtained. Prior Anticoagulants: The patient has                            taken no anticoagulant or antiplatelet agents. ASA                            Grade Assessment: II - A patient with mild systemic                            disease. After reviewing the risks and benefits,                            the patient was deemed in satisfactory condition to                            undergo the procedure.                           After obtaining informed consent, the colonoscope  was passed under direct vision. Throughout the                            procedure, the patient's blood pressure, pulse, and                            oxygen saturations were monitored continuously. The                            Olympus [id number] ([id number]) Colonoscope was                            introduced through the anus and advanced to the the                            terminal  ileum, with identification of the                            appendiceal orifice and IC valve. The colonoscopy                            was performed without difficulty. The patient                            tolerated the procedure well. The quality of the                            bowel preparation was excellent. The terminal                            ileum, ileocecal valve, appendiceal orifice, and                            rectum were photographed. Scope In: 3:04:12 PM Scope Out: 3:20:37 PM Scope Withdrawal Time: 0 hours 13 minutes 28 seconds  Total Procedure Duration: 0 hours 16 minutes 25 seconds  Findings:                 The perianal and digital rectal examinations were                            normal.                           The terminal ileum appeared normal. Biopsies were                            taken with a cold forceps for histology.                            Verification of patient identification for the                            specimen was done. Estimated blood loss was minimal.  Two sessile polyps were found in the cecum. The                            polyps were 2 to 6 mm in size. These polyps were                            removed with a cold snare. Resection and retrieval                            were complete. Verification of patient                            identification for the specimen was done. Estimated                            blood loss was minimal.                           The exam was otherwise without abnormality on                            direct and retroflexion views.                           Biopsies for histology were taken with a cold                            forceps from the entire colon for evaluation of                            microscopic colitis. Complications:            No immediate complications. Estimated Blood Loss:     Estimated blood loss was minimal. Impression:               - The  examined portion of the ileum was normal.                            Biopsied.                           - Two 2 to 6 mm polyps in the cecum, removed with a                            cold snare. Resected and retrieved.                           - The examination was otherwise normal on direct                            and retroflexion views.                           - Biopsies were taken with a cold forceps from the  entire colon for evaluation of microscopic colitis.                           - 2020 exam 2 mm ileal ulcer and lymphocytic                            colitis seen Recommendation:           - Patient has a contact number available for                            emergencies. The signs and symptoms of potential                            delayed complications were discussed with the                            patient. Return to normal activities tomorrow.                            Written discharge instructions were provided to the                            patient.                           - Resume previous diet.                           - Continue present medications.                           - Await pathology results.                           - Repeat colonoscopy is recommended. The                            colonoscopy date will be determined after pathology                            results from today's exam become available for                            review. Gatha Mayer, MD 11/30/2022 3:33:24 PM This report has been signed electronically.

## 2022-11-30 NOTE — Patient Instructions (Addendum)
Nada mala visto  Se extirparon 2 pequenos polipos de colon  Tome biopsisas de estomago e intestino  Cuando regresen [employer] llamare  Gatha Mayer, MD, FACG   USTED TUVO UN PROCEDIMIENTO ENDOSCPICO HOY EN EL [facility]:   Lea el informe del procedimiento que se le entreg para cualquier pregunta especfica sobre lo que se encontr durante su examen.  Si el informe del examen no responde a sus preguntas, por favor llame a su gastroenterlogo para aclararlo.  Si usted solicit que no se le den [employer] de lo que se Estate manager/land agent en su procedimiento al [employer] va a cuidar, entonces el informe del procedimiento se ha incluido en un sobre sellado para que usted lo revise despus cuando le sea ms conveniente.   LO QUE PUEDE ESPERAR: Algunas sensaciones de hinchazn en el abdomen.  Puede tener ms gases de lo normal.  El caminar puede ayudarle a eliminar el aire que se le puso en el tracto gastrointestinal durante el procedimiento y reducir la hinchazn.  Si le hicieron una endoscopia inferior (como una colonoscopia o una sigmoidoscopia flexible), podra notar manchas de sangre en las heces fecales o en el papel higinico.  Si se someti a una preparacin intestinal para su procedimiento, es posible que no tenga una evacuacin intestinal normal durante [employer].   Tenga en cuenta:  Es posible que note un poco de irritacin y congestin en la nariz o algn drenaje.  Esto es debido al oxgeno [employer] durante su procedimiento.  No hay que preocuparse y esto debe desaparecer ms o Scientist, research (medical).   SNTOMAS PARA REPORTAR INMEDIATAMENTE:  Despus de una endoscopia inferior (colonoscopia o sigmoidoscopia flexible):  Cantidades excesivas de sangre en las heces fecales  Sensibilidad significativa o empeoramiento de los dolores abdominales   Hinchazn aguda del abdomen que antes no tena   Fiebre de 100F o ms   Despus de la endoscopia superior (EGD)  Vmitos de  Biochemist, clinical o material como caf molido   Dolor en el pecho o dolor debajo de los omplatos que antes no tena   Dolor o dificultad persistente para tragar  Falta de aire que antes no tena   Fiebre de 100F o ms  Heces fecales negras y pegajosas   Para asuntos urgentes o de Freight forwarder, puede comunicarse con un gastroenterlogo a cualquier hora llamando al [phone].  DIETA:  Recomendamos una comida pequea al principio, pero luego puede continuar con su dieta normal.  Tome muchos lquidos, Teacher, adult education las bebidas alcohlicas durante 24 horas.    ACTIVIDAD:  Debe planear tomarse las cosas con calma por el resto del da y no debe CONDUCIR ni usar maquinaria pesada Programmer, applications (debido a los medicamentos de sedacin utilizados durante el examen).     SEGUIMIENTO: Nuestro personal llamar al nmero que aparece en su historial al siguiente da hbil de su procedimiento para ver cmo se siente y para responder cualquier pregunta o inquietud que pueda tener con respecto a la informacin que se le dio despus del procedimiento. Si no podemos contactarle, le dejaremos un mensaje.  Sin embargo, si se siente bien y no tiene Paediatric nurse, no es necesario que nos devuelva la llamada.  Asumiremos que ha regresado a sus actividades diarias normales sin incidentes. Si se le tomaron algunas biopsias, le contactaremos por telfono o por carta en las prximas 3 semanas.  Si no ha sabido [employer] biopsias en el transcurso de 3 semanas, por  favor llmenos al [phone].   FIRMAS/CONFIDENCIALIDAD: Usted y/o el acompaante que le cuide han firmado documentos que se ingresarn en su historial mdico electrnico.  Estas firmas atestiguan el hecho de que la informacin anterior

## 2022-12-01 ENCOUNTER — Telehealth: Payer: Self-pay | Admitting: *Deleted

## 2022-12-01 NOTE — Telephone Encounter (Signed)
  Follow up Call-     11/30/2022    2:12 PM  Call back number  Post procedure Call Back phone  # [phone]  Permission to leave phone message Yes     Patient questions:  Do you have a fever, pain , or abdominal swelling? No. Pain Score  0 *  Have you tolerated food without any problems? Yes.    Have you been able to return to your normal activities? Yes.    Do you have any questions about your discharge instructions: Diet   No. Medications  No. Follow up visit  No.  Do you have questions or concerns about your Care? No.  Actions: * If pain score is 4 or above: No action needed, pain <4.

## 2022-12-11 ENCOUNTER — Other Ambulatory Visit (HOSPITAL_COMMUNITY): Payer: Self-pay | Admitting: Obstetrics and Gynecology

## 2022-12-11 DIAGNOSIS — N6310 Unspecified lump in the right breast, unspecified quadrant: Secondary | ICD-10-CM

## 2022-12-12 ENCOUNTER — Encounter: Payer: Self-pay | Admitting: Internal Medicine

## 2022-12-12 ENCOUNTER — Other Ambulatory Visit: Payer: Self-pay | Admitting: Internal Medicine

## 2022-12-12 DIAGNOSIS — Z860101 Personal history of adenomatous and serrated colon polyps: Secondary | ICD-10-CM

## 2022-12-12 DIAGNOSIS — Z86010 Personal history of colonic polyps: Secondary | ICD-10-CM

## 2022-12-12 DIAGNOSIS — B9681 Helicobacter pylori [H. pylori] as the cause of diseases classified elsewhere: Secondary | ICD-10-CM

## 2022-12-12 HISTORY — DX: Helicobacter pylori (H. pylori) as the cause of diseases classified elsewhere: B96.81

## 2022-12-12 HISTORY — DX: Personal history of adenomatous and serrated colon polyps: Z86.0101

## 2022-12-13 ENCOUNTER — Other Ambulatory Visit: Payer: Self-pay

## 2022-12-13 ENCOUNTER — Ambulatory Visit: Payer: Self-pay | Admitting: Physician Assistant

## 2022-12-13 ENCOUNTER — Encounter: Payer: Self-pay | Admitting: Physician Assistant

## 2022-12-13 VITALS — BP 112/65 | HR 84 | Temp 97.7°F | Ht 59.0 in | Wt 189.2 lb

## 2022-12-13 DIAGNOSIS — B9681 Helicobacter pylori [H. pylori] as the cause of diseases classified elsewhere: Secondary | ICD-10-CM

## 2022-12-13 DIAGNOSIS — Z789 Other specified health status: Secondary | ICD-10-CM

## 2022-12-13 DIAGNOSIS — G8929 Other chronic pain: Secondary | ICD-10-CM

## 2022-12-13 MED ORDER — BISMUTH SUBSALICYLATE 262 MG PO CHEW: 524.0000 mg | CHEWABLE_TABLET | Freq: Four times a day (QID) | ORAL | 0 refills | Status: AC

## 2022-12-13 MED ORDER — METRONIDAZOLE 250 MG PO TABS: 250.0000 mg | ORAL_TABLET | Freq: Four times a day (QID) | ORAL | 0 refills | Status: AC

## 2022-12-13 MED ORDER — DOXYCYCLINE HYCLATE 100 MG PO CAPS: 100.0000 mg | ORAL_CAPSULE | Freq: Two times a day (BID) | ORAL | 0 refills | Status: AC

## 2022-12-13 MED ORDER — OMEPRAZOLE 20 MG PO CPDR: 20.0000 mg | DELAYED_RELEASE_CAPSULE | Freq: Two times a day (BID) | ORAL | 0 refills | Status: DC

## 2022-12-13 NOTE — Patient Instructions (Addendum)
[facility] - [phone]  #1 tiene una infeccin estomacal, H. pylori, que necesita tratamiento y tratarla debera ayudar con su dolor abdominal.   1) Omeprazole 20 mg 2 veces al dia x 14 dias 2) Pepto Bismol 2 tabs (262 mg each) 4 veces a dia x 14 dias 3) Metronidazole 250 mg 4 veces al dia x 14 dias 4) doxycycline 100 mg 2 veces al dia x 14 dias   Despus de 14 das suspender tambin el omeprazol.   En 4 semanas despus de completar el tratamiento, aplique el antgeno en heces de H. Pylori   #2 plipos de colon uno era abnormal y el otro era inflamacion - repetira colonoscopia en 7 aos   #3 biopsias de colon estuvieron bien, sin inflamacin   #4 programe una visita al consultorio del especialista para en 2 meses

## 2022-12-13 NOTE — Progress Notes (Signed)
BP 112/65   Pulse 84   Temp 97.7 F (36.5 C)   Ht 4\' 11"  (1.499 m)   Wt 189 lb 4 oz (85.8 kg)   SpO2 98%   BMI 38.22 kg/m    Subjective:    Patient ID: Alison Mendoza, female    DOB: October 01, 1980, 42 y.o.   MRN: [id number]  HPI: Shaiel Pelz is a 42 y.o. female presenting on 12/13/2022 for Abdominal Pain (Pt c/o lower abd pulsing pain for the past 6 months- pain is worse when she is menstruating descibes it as labor pain during menses and menstrual cramps when not menstrating. Pain is present when she wakes up every morning some days is more painful than others. Pt takes tylenol which eases the pain, but doesn't relieve it.  Pt feels "bumps" on her lower abd which hurt. Pt's cycle has changed - it is more frequent and lasts 4-5 days  previously lasted 3 days. Bleeding has also been heavier w/ more clots)   HPI    Chief Complaint  Patient presents with   Abdominal Pain    Pt c/o lower abd pulsing pain for the past 6 months- pain is worse when she is menstruating descibes it as labor pain during menses and menstrual cramps when not menstrating. Pain is present when she wakes up every morning some days is more painful than others. Pt takes tylenol which eases the pain, but doesn't relieve it.  Pt feels "bumps" on her lower abd which hurt. Pt's cycle has changed - it is more frequent and lasts 4-5 days  previously lasted 3 days. Bleeding has also been heavier w/ more clots    She has no emesis and is having BMs.     Relevant past medical, surgical, family and social history reviewed and updated as indicated. Interim medical history since our last visit reviewed. Allergies and medications reviewed and updated.    Current Outpatient Medications:    cyclobenzaprine (FLEXERIL) 5 MG tablet, Take 5 mg by mouth every 8 (eight) hours as needed., Disp: , Rfl:     Review of Systems  Per HPI unless specifically indicated above     Objective:    BP 112/65   Pulse 84   Temp  97.7 F (36.5 C)   Ht 4\' 11"  (1.499 m)   Wt 189 lb 4 oz (85.8 kg)   SpO2 98%   BMI 38.22 kg/m   Wt Readings from Last 3 Encounters:  12/13/22 189 lb 4 oz (85.8 kg)  11/30/22 188 lb (85.3 kg)  11/10/22 189 lb 6.4 oz (85.9 kg)    Physical Exam Constitutional:      General: She is not in acute distress.    Appearance: She is obese. She is not toxic-appearing.  HENT:     Head: Normocephalic and atraumatic.  Cardiovascular:     Rate and Rhythm: Normal rate and regular rhythm.  Pulmonary:     Effort: Pulmonary effort is normal. No respiratory distress.     Breath sounds: Normal breath sounds. No stridor. No wheezing, rhonchi or rales.  Abdominal:     General: Bowel sounds are normal.     Palpations: Abdomen is soft. There is no hepatomegaly, splenomegaly, mass or pulsatile mass.     Tenderness: There is generalized abdominal tenderness. There is no right CVA tenderness, left CVA tenderness, guarding or rebound.     Comments: Mild generalized tenderness.    Musculoskeletal:     Right lower leg: No edema.  Left lower leg: No edema.  Neurological:     Mental Status: She is alert and oriented to person, place, and time.  Psychiatric:        Attention and Perception: Attention normal.        Speech: Speech normal.        Behavior: Behavior is cooperative.              Assessment & Plan:    Encounter Diagnoses  Name Primary?   Chronic abdominal pain Yes   Not proficient in [place] language      After speaking with Remo Lipps at Dr Celesta Aver office, [facility]'s bilingual LPN Berenice read pathology result notes to patient.  Pt is to expect call from Remo Lipps to review this information as well.   The GI office will send rx.     Discussed with pt that she needs to complete the treatment for h.pylori before addition of other medications.  Pt with long history of abdominal pain.  Hopeful that h pylori treatment will provide some relief.    She will be scheduled to follow up here  in 1 month.  She is given phone number to follow up with [facility] for breast imaging.

## 2022-12-28 ENCOUNTER — Ambulatory Visit (HOSPITAL_COMMUNITY)
Admission: RE | Admit: 2022-12-28 | Discharge: 2022-12-28 | Disposition: A | Discharge status: Home / Self Care | Payer: Self-pay | Source: Ambulatory Visit | Attending: Obstetrics and Gynecology | Admitting: Obstetrics and Gynecology

## 2022-12-28 DIAGNOSIS — N6310 Unspecified lump in the right breast, unspecified quadrant: Secondary | ICD-10-CM | POA: Insufficient documentation

## 2023-01-09 ENCOUNTER — Telehealth: Payer: Self-pay | Admitting: Student

## 2023-01-09 NOTE — Telephone Encounter (Signed)
Pt called stating she was still nauseous and to report that she has completed the treatment for H. Pylori.  Pt was advised to contact her GI specialist regarding this. Pt was also urged to contact [facility] as her CAFA has expired. Pt verbalized understanding

## 2023-01-11 ENCOUNTER — Telehealth: Payer: Self-pay | Admitting: Internal Medicine

## 2023-01-11 MED ORDER — PROMETHAZINE HCL 25 MG PO TABS: 25.0000 mg | ORAL_TABLET | Freq: Four times a day (QID) | ORAL | 0 refills | Status: DC | PRN

## 2023-01-11 NOTE — Telephone Encounter (Signed)
Called patient with interpreter services to discuss ongoing daily nausea w/occasional vomiting & RUQ abdominal pain. She is able to hold down fluids/food, but has a poor appetite. No recent changes with BM's. She recently finished treatment for H. Pylori, and currently taking omeprazole daily. No improvements in symptoms. Advised her to stop PPI until repeat stool test is completed. She has an OV scheduled with Dr. Leone Payor on 02/21/23. Will route to MD for any further recommendations until patient can be seen.

## 2023-01-11 NOTE — Telephone Encounter (Signed)
I sent in the following rx to [facility]:  Meds ordered this encounter  Medications   promethazine (PHENERGAN) 25 MG tablet    Sig: Take 1 tablet (25 mg total) by mouth every 6 (six) hours as needed for nausea or vomiting.    Dispense:  30 tablet    Refill:  0   She can be seen in my 01/24/23 hemorrhoid banding spot

## 2023-01-11 NOTE — Telephone Encounter (Signed)
Inbound call from patient states she is experiencing nausea and would prefer to speak with a nurse. Please advise.

## 2023-01-11 NOTE — Telephone Encounter (Signed)
Spoke with patient with interpreter services & she was able to verbalize all understanding regarding medication and updated appointment date/time. Appointment has been rescheduled for 01/24/23 at 2:50 pm with Dr. Leone Payor.

## 2023-01-24 ENCOUNTER — Ambulatory Visit (INDEPENDENT_AMBULATORY_CARE_PROVIDER_SITE_OTHER): Payer: Self-pay | Admitting: Internal Medicine

## 2023-01-24 ENCOUNTER — Encounter: Payer: Self-pay | Admitting: Internal Medicine

## 2023-01-24 VITALS — BP 136/84 | HR 71 | Ht 59.0 in | Wt 188.0 lb

## 2023-01-24 DIAGNOSIS — K2970 Gastritis, unspecified, without bleeding: Secondary | ICD-10-CM

## 2023-01-24 DIAGNOSIS — B9681 Helicobacter pylori [H. pylori] as the cause of diseases classified elsewhere: Secondary | ICD-10-CM

## 2023-01-24 DIAGNOSIS — R1032 Left lower quadrant pain: Secondary | ICD-10-CM

## 2023-01-24 DIAGNOSIS — N946 Dysmenorrhea, unspecified: Secondary | ICD-10-CM

## 2023-01-24 DIAGNOSIS — R1031 Right lower quadrant pain: Secondary | ICD-10-CM

## 2023-01-24 NOTE — Progress Notes (Signed)
Alison Mendoza 42 y.o. 1980/12/28 [id number]  Assessment & Plan:   Encounter Diagnoses  Name Primary?   Helicobacter pylori gastritis Yes   Dysmenorrhea    Bilateral lower abdominal pain     I think the pain from Helicobacter pylori is gone.  She has bilateral lower abdominal pain and dysmenorrhea so I think gynecologic issues may be the source of this.  She needs to see gynecology I will communicate with the [facility] about this.  In addition I will ask them to test her for H. pylori clearance through the free clinic.  I will be available to see her back as needed. CC: Jacquelin Hawking, PA-C    Subjective:   Chief Complaint: H. pylori follow-up and abdominal pain  HPI 43 year old Hispanic woman presents with interpreter, she had been seen for abdominal pain and right upper quadrant pain, EGD was performed on 11/30/2022 and she had H. pylori gastritis.  This was treated with quadruple therapy.  She also had a colonoscopy for change in bowel habits.  Impression:               - The examined portion of the ileum was normal.                            Biopsied.                           - Two 2 to 6 mm polyps in the cecum, removed with a                            cold snare. Resected and retrieved.                           - The examination was otherwise normal on direct                            and retroflexion views.                           - Biopsies were taken with a cold forceps from the                            entire colon for evaluation of microscopic colitis.                           - 2020 exam 2 mm ileal ulcer and lymphocytic                            colitis seen  1. Surgical [P], gastric antrum and gastric body - GASTRIC ANTRAL AND OXYNTIC MUCOSA WITH HELICOBACTER PYLORI-ASSOCIATED GASTRITIS - HELICOBACTER PYLORI-LIKE ORGANISMS WERE IDENTIFIED ON ROUTINE H&E STAIN 2. Surgical [P], small bowel, terminal ileum - ILEAL MUCOSA WITH NO  SPECIFIC HISTOPATHOLOGIC CHANGES - NEGATIVE FOR ACUTE INFLAMMATION, FEATURES OF CHRONICITY OR GRANULOMAS 3. Surgical [P], colon, cecum, polyp (2) - TUBULAR ADENOMA WITHOUT HIGH-GRADE DYSPLASIA OR MALIGNANCY - INFLAMMATORY POLYP 4. Surgical [P], colon nos, random sites - COLONIC MUCOSA WITH NO SPECIFIC HISTOPATHOLOGIC CHANGES - NEGATIVE FOR ACUTE INFLAMMATION  She reports that her abdominal  pain is much better but she is continuing to have bilateral lower quadrant abdominal pains.  She also has very painful.'s with pain intensifying during that time.  She has heavy menses.  She has typically been monthly her last.  In April was on the ninth and this month it came on the second so it somewhat irregular.  She has taken Tylenol with slight benefit. No Known Allergies Current Meds  Medication Sig   promethazine (PHENERGAN) 25 MG tablet Take 1 tablet (25 mg total) by mouth every 6 (six) hours as needed for nausea or vomiting.   Past Medical History:  Diagnosis Date   Abnormal Pap smear    Headache(784.0)    Helicobacter pylori gastritis 12/12/2022   History of worms    3 yrs ago   Hx of adenomatous polyp of colon 12/12/2022   March 2024 diminutive adenoma plus inflammatory polyp recall 7 years 2031   Lymphocytic colitis 05/23/2019   Salmonella 2006   Past Surgical History:  Procedure Laterality Date   CESAREAN SECTION  09/18/2001   done in [place]; vertical ext scar; unknown int. scar   COLONOSCOPY     COLPOSCOPY     UPPER GASTROINTESTINAL ENDOSCOPY     Social History   Social History Narrative   Boyfriend/Husband live w/ daughters   Plumber's helper   fromi [place]      Daughter 2015   Daughter 2003      Never smoker, no drugs, rare EtOH         family history includes Diabetes in her paternal aunt and sister; Lung disease in her mother.   Review of Systems As per HPI  Objective:   Physical Exam BP 136/84   Pulse 71   Ht 4\' 11"  (1.499 m)   Wt 188 lb (85.3 kg)    BMI 37.97 kg/m   Abdomen is obese soft nontender on exam

## 2023-01-24 NOTE — Patient Instructions (Signed)
Le dire a la clinica gratuita que le brinde la ayuda que [place].  [employer] para los [employer].  Iva Boop, MD, Clementeen Graham

## 2023-01-30 ENCOUNTER — Other Ambulatory Visit: Payer: Self-pay | Admitting: Obstetrics and Gynecology

## 2023-01-30 DIAGNOSIS — Z1231 Encounter for screening mammogram for malignant neoplasm of breast: Secondary | ICD-10-CM

## 2023-01-31 ENCOUNTER — Encounter: Payer: Self-pay | Admitting: Physician Assistant

## 2023-01-31 ENCOUNTER — Ambulatory Visit: Payer: Self-pay | Admitting: Physician Assistant

## 2023-01-31 DIAGNOSIS — N946 Dysmenorrhea, unspecified: Secondary | ICD-10-CM

## 2023-01-31 NOTE — Progress Notes (Signed)
   There were no vitals taken for this visit.   Subjective:    Patient ID: Alison Mendoza, female    DOB: [birth year], 42 y.o.   MRN: [id number]  HPI: Alison Mendoza is a 42 y.o. female presenting on 01/31/2023 for No chief complaint on file.   HPI   This is a telemedicine appointment through [employer]  I connected with  Alison Mendoza on 01/31/23 by a video enabled telemedicine application and verified that I am speaking with the correct person using two identifiers.   I discussed the limitations of evaluation and management by telemedicine. The patient expressed understanding and agreed to proceed.  Pt is at work.  Provider and translator are at office.    Pt was scheduled for in-person appointment but called and changed to virtual appointment due to She is sick.    She is at work currently.  she has cough and cold sore.  She is using otc cough med.    She uses promethazine 4/wk.  She has been seen by GI for abdominal pain.  She continues to have abd pain.  She says it Hurts low/bottom of stomach.   The pain comes and goes.  It hurts worse with menses.   GI recommends pt be treated for dysmenorrhea/refer to gyn.  She uses condoms for contraception.  She says she has never used hormonal contraception.  Her Last pap 2021.      Relevant past medical, surgical, family and social history reviewed and updated as indicated. Interim medical history since our last visit reviewed. Allergies and medications reviewed and updated.   Review of Systems  Per HPI unless specifically indicated above     Objective:    There were no vitals taken for this visit.  Wt Readings from Last 3 Encounters:  01/24/23 188 lb (85.3 kg)  12/13/22 189 lb 4 oz (85.8 kg)  11/30/22 188 lb (85.3 kg)    Physical Exam Constitutional:      General: She is not in acute distress.    Appearance: She is not toxic-appearing.  HENT:     Head: Normocephalic and atraumatic.  Pulmonary:     Effort:  Pulmonary effort is normal. No respiratory distress.     Comments: Pt is talking in complete sentences without dyspnea Neurological:     Mental Status: She is alert and oriented to person, place, and time.  Psychiatric:        Behavior: Behavior normal.           Assessment & Plan:    Encounter Diagnosis  Name Primary?   Dysmenorrhea Yes     -discussed with pt option of referral to gyn or trial of hormonal contraception that can be prescribed here.  Pt prefers the later.  She will need to come into the office to discuss options- OCP, iud, depo.   -will give pt testing requested by GI- stool test to check eradication of h pylori- at that appointment

## 2023-02-08 ENCOUNTER — Ambulatory Visit: Payer: Self-pay | Admitting: Physician Assistant

## 2023-02-08 ENCOUNTER — Other Ambulatory Visit (HOSPITAL_COMMUNITY)
Admission: RE | Admit: 2023-02-08 | Discharge: 2023-02-08 | Disposition: A | Discharge status: Home / Self Care | Payer: Self-pay | Source: Ambulatory Visit | Attending: Physician Assistant | Admitting: Physician Assistant

## 2023-02-08 ENCOUNTER — Encounter: Payer: Self-pay | Admitting: Physician Assistant

## 2023-02-08 ENCOUNTER — Other Ambulatory Visit (HOSPITAL_COMMUNITY)
Admission: RE | Admit: 2023-02-08 | Discharge: 2023-02-08 | Disposition: A | Discharge status: Home / Self Care | Payer: Self-pay | Source: Ambulatory Visit | Attending: Internal Medicine | Admitting: Internal Medicine

## 2023-02-08 VITALS — BP 109/67 | HR 73 | Temp 97.8°F | Ht 59.0 in | Wt 189.0 lb

## 2023-02-08 DIAGNOSIS — A048 Other specified bacterial intestinal infections: Secondary | ICD-10-CM

## 2023-02-08 DIAGNOSIS — Z789 Other specified health status: Secondary | ICD-10-CM

## 2023-02-08 DIAGNOSIS — B9681 Helicobacter pylori [H. pylori] as the cause of diseases classified elsewhere: Secondary | ICD-10-CM

## 2023-02-08 DIAGNOSIS — N946 Dysmenorrhea, unspecified: Secondary | ICD-10-CM

## 2023-02-08 NOTE — Patient Instructions (Signed)
Medroxyprogesterone Injection (Contraception) Qu es este medicamento? La MEDROXIPROGESTERONA evita la ovulacin y el embarazo. Pertenece a un grupo de medicamentos llamados anticonceptivos. Este medicamento es una hormona del grupo de los progestgenos. Este medicamento puede ser utilizado para otros usos; si tiene alguna pregunta consulte con su proveedor de atencin mdica o con su farmacutico. MARCAS COMUNES: Depo-Provera, Depo-subQ Provera 104 Qu le debo informar a mi profesional de la salud antes de tomar este medicamento? Necesitan saber si usted presenta alguno de los siguientes problemas o situaciones: Asma Cogulos sanguneos Cncer de mama o antecedentes familiares de cncer de mama Depresin Diabetes Trastorno de la alimentacin (anorexia nerviosa) Consume bebidas alcohlicas con frecuencia Ataque cardiaco Presin arterial alta Infeccin por VIH o SIDA Enfermedad renal Enfermedad heptica Migraas Osteoporosis, huesos dbiles Convulsiones Accidente cerebrovascular Uso de tabaco Sangrado vaginal Una reaccin alrgica o inusual a la medroxiprogesterona, a otros medicamentos, alimentos, colorantes o conservantes Si est embarazada o buscando quedar embarazada Si est amamantando a un beb Cmo debo utilizar este medicamento? Depo-Provera CI inyeccin anticonceptiva se administra en un msculo. Depo-subQ Provera 104 inyectable se administra por va subcutnea. Se administra en un hospital o en un entorno clnico. Esta inyeccin generalmente se aplica durante los primeros 5 das del inicio de un periodo menstrual o 6 semanas despus de haber tenido un beb. Recibir un folleto de informacin para el paciente con cada receta y en cada ocasin que la vuelva a surtir. Asegrese de leer esta informacin cada vez cuidadosamente. El folleto puede cambiar frecuentemente. Hable con su equipo de atencin sobre el uso de este medicamento en nios. Puede requerir atencin especial.  Estas inyecciones han sido usadas en nias que han empezado a tener perodos menstruales. Sobredosis: Pngase en contacto inmediatamente con un centro toxicolgico o una sala de urgencia si usted cree que haya tomado demasiado medicamento. ATENCIN: Este medicamento es solo para usted. No comparta este medicamento con nadie. Qu sucede si me olvido de una dosis? Cumpla con las citas para dosis de seguimiento. Debe recibir una inyeccin una vez cada 3 meses. Es importante no olvidar ninguna dosis. Llame a su equipo de atencin si no puede asistir a una cita. Qu puede interactuar con este medicamento? Antibiticos o medicamentos para inyecciones, especialmente rifampicina y griseofulvina Medicamentos antivirales para VIH o hepatitis Aprepitant Armodafinilo [place] Medicamentos para convulsiones, tales como carbamazepina, felbamato, oxcarbazepina, fenitona, fenobarbital, primidona, topiramato [place] Modafinilo [place] Puede ser que esta lista no menciona todas las posibles interacciones. Informe a su profesional de la salud de todos los productos a base de hierbas, medicamentos de venta libre o suplementos nutritivos que est tomando. Si usted fuma, consume bebidas alcohlicas o si utiliza drogas ilegales, indqueselo tambin a su profesional de la salud. Algunas sustancias pueden interactuar con su medicamento. A qu debo estar atento al usar este medicamento? Este medicamento no la protege de la infeccin por VIH (SIDA) ni de ninguna otra enfermedad de transmisin sexual. Usar este producto puede causarle prdida de calcio de los huesos. La prdida de calcio puede causar debilidad sea (osteoporosis). Solamente use este producto por ms de 2 aos si no hay otro mtodo anticonceptivo que sea adecuado para usted. Cuanto ms tiempo use este producto como anticonceptivo, ms posibilidades tendr de estar en riesgo de debilitar sus huesos. Consulte a su equipo de atencin  sobre cmo mantener fuertes sus huesos. Es posible que tenga un cambio en el patrn de sangrado o perodos menstruales irregulares. Muchas mujeres dejan de tener perodos menstruales mientras   toman este medicamento. Si recibi sus inyecciones a tiempo, sus probabilidades de estar embarazada son muy bajas. Si cree que podra estar embarazada, consulte a su equipo de atencin tan pronto como sea posible. Informe a su equipo de atencin si desea quedar embarazada en el prximo ao. El efecto de este medicamento puede durar mucho tiempo despus de recibir la ltima inyeccin. Qu efectos secundarios puedo tener al utilizar este medicamento? Efectos secundarios que debe informar a su equipo de atencin tan pronto como sea posible: Reacciones alrgicas: erupcin cutnea, comezn/picazn, urticaria, hinchazn de la cara, los labios, la lengua o la garganta Cogulo sanguneo: dolor, hinchazn, calor en una pierna, falta de aire, dolor en el pecho Problemas en la vescula biliar: dolor de estmago intenso, nuseas, vmitos, fiebre Aumento de la presin arterial Lesin en el hgado: dolor en la regin abdominal superior derecha, prdida de apetito, nuseas, heces de color claro, orina amarilla oscura o marrn, color amarillento de los ojos o la piel, debilidad o fatiga inusuales Migraas o dolores de cabeza nuevos o peores Convulsiones Accidente cerebrovascular: entumecimiento o debilidad repentinos de la cara, un brazo o una pierna, dificultad para hablar, confusin, dificultad para caminar, prdida de equilibrio o coordinacin, mareos, dolor de cabeza intenso, cambio en la visin Flujo vaginal inusual, comezn/picazn u olor Empeoramiento del estado de nimo, sentimientos de depresin Efectos secundarios que generalmente no requieren atencin mdica (debe informarlos a su equipo de atencin si persisten o si son molestos): Dolor o sensibilidad de las mamas [place] oscuras de la piel en la cara u otras  reas expuestas al sol Ciclos menstruales irregulares o sangrado ligero entre periodos menstruales Nuseas Aumento de peso Puede ser que esta lista no menciona todos los posibles efectos secundarios. Comunquese a su mdico por asesoramiento mdico sobre los efectos secundarios. Usted puede informar los efectos secundarios a la FDA por telfono al 1-800-FDA-1088. Dnde debo guardar mi medicina? Solamente un equipo de atencin puede administrar esta inyeccin. No se guarda en su casa. ATENCIN: Este folleto es un resumen. Puede ser que no cubra toda la posible informacin. Si usted tiene preguntas acerca de esta medicina, consulte con su mdico, su farmacutico o su profesional de la salud.  2023 Elsevier/Gold Standard (2022-07-26 00:00:00)  

## 2023-02-08 NOTE — Progress Notes (Signed)
BP 109/67   Pulse 73   Temp 97.8 F (36.6 C)   Ht 4\' 11"  (1.499 m)   Wt 189 lb (85.7 kg)   SpO2 99%   BMI 38.17 kg/m    Subjective:    Patient ID: Alison Mendoza, female    DOB: Oct 10, 1980, 42 y.o.   MRN: [id number]  HPI: Alison Mendoza is a 42 y.o. female presenting on 02/08/2023 for Abdominal Pain   HPI   Pt is 42yoF who is in today for f/u abd pain.  At her appointment last week, discussed with pt option of referral to gyn or trial of hormonal contraception that can be prescribed here.  Pt prefers the later.   requested by GI- stool test to check eradication of h pylori-   She takes condoms currently for pregnancy prevention.  LMP 01/19/23   She is not currently sexually active due to her pain She is taking stomach med [facility] she [facility]  .called pharmacy but doesn't open until 9am  PAP up-to-date and normal  Pt works doing plumbing.   Relevant past medical, surgical, family and social history reviewed and updated as indicated. Interim medical history since our last visit reviewed. Allergies and medications reviewed and updated.    Current Outpatient Medications:    PRESCRIPTION MEDICATION, Take 1 tablet by mouth daily. For GERD - pt unsure which one it is, but states her GI prescribes it, Disp: , Rfl:    promethazine (PHENERGAN) 25 MG tablet, Take 1 tablet (25 mg total) by mouth every 6 (six) hours as needed for nausea or vomiting., Disp: 30 tablet, Rfl: 0    Review of Systems  Per HPI unless specifically indicated above     Objective:    BP 109/67   Pulse 73   Temp 97.8 F (36.6 C)   Ht 4\' 11"  (1.499 m)   Wt 189 lb (85.7 kg)   SpO2 99%   BMI 38.17 kg/m   Wt Readings from Last 3 Encounters:  02/08/23 189 lb (85.7 kg)  01/24/23 188 lb (85.3 kg)  12/13/22 189 lb 4 oz (85.8 kg)    Physical Exam Vitals reviewed.  Constitutional:      General: She is not in acute distress.    Appearance: She is well-developed. She is obese. She is not  toxic-appearing.  HENT:     Head: Normocephalic and atraumatic.  Cardiovascular:     Rate and Rhythm: Normal rate and regular rhythm.  Pulmonary:     Effort: Pulmonary effort is normal.     Breath sounds: Normal breath sounds.  Abdominal:     General: Bowel sounds are normal.     Palpations: Abdomen is soft. There is no hepatomegaly, splenomegaly, mass or pulsatile mass.     Tenderness: There is no abdominal tenderness. There is no guarding or rebound.  Musculoskeletal:     Cervical back: Neck supple.     Right lower leg: No edema.     Left lower leg: No edema.  Lymphadenopathy:     Cervical: No cervical adenopathy.  Skin:    General: Skin is warm and dry.  Neurological:     Mental Status: She is alert and oriented to person, place, and time.  Psychiatric:        Behavior: Behavior normal.           Assessment & Plan:    Encounter Diagnoses  Name Primary?   Dysmenorrhea Yes   H. pylori infection  Not proficient in English language      -H pylori stool test given to pt -Discussed hormonal contraceptive options for treatment of dysmenorrhea.  She prefers depo-provera (she gave fee today) -will get pelvic US -pt is encouraged to update her cafa -pt will follow up for OV next week for depo -pt was offered covid vax and she declined  (Spent 30 minutes face to face with pt)

## 2023-02-10 LAB — H. PYLORI ANTIGEN, STOOL: H. Pylori Stool Ag, Eia: NEGATIVE

## 2023-02-15 ENCOUNTER — Ambulatory Visit (HOSPITAL_COMMUNITY)
Admission: RE | Admit: 2023-02-15 | Discharge: 2023-02-15 | Disposition: A | Discharge status: Home / Self Care | Payer: Self-pay | Source: Ambulatory Visit | Attending: Physician Assistant | Admitting: Physician Assistant

## 2023-02-15 ENCOUNTER — Other Ambulatory Visit: Payer: Self-pay

## 2023-02-15 ENCOUNTER — Emergency Department (HOSPITAL_COMMUNITY)
Admission: EM | Admit: 2023-02-15 | Discharge: 2023-02-15 | Disposition: A | Discharge status: Home / Self Care | Payer: Self-pay | Attending: Emergency Medicine | Admitting: Emergency Medicine

## 2023-02-15 ENCOUNTER — Ambulatory Visit: Payer: Self-pay | Admitting: Physician Assistant

## 2023-02-15 DIAGNOSIS — N946 Dysmenorrhea, unspecified: Secondary | ICD-10-CM | POA: Insufficient documentation

## 2023-02-15 DIAGNOSIS — R1084 Generalized abdominal pain: Secondary | ICD-10-CM | POA: Insufficient documentation

## 2023-02-15 DIAGNOSIS — M79604 Pain in right leg: Secondary | ICD-10-CM | POA: Insufficient documentation

## 2023-02-15 DIAGNOSIS — R112 Nausea with vomiting, unspecified: Secondary | ICD-10-CM | POA: Insufficient documentation

## 2023-02-15 LAB — COMPREHENSIVE METABOLIC PANEL
ALT: 18 U/L (ref 0–44)
AST: 17 U/L (ref 15–41)
Albumin: 3.8 g/dL (ref 3.5–5.0)
Alkaline Phosphatase: 88 U/L (ref 38–126)
Anion gap: 7 (ref 5–15)
BUN: 10 mg/dL (ref 6–20)
CO2: 22 mmol/L (ref 22–32)
Calcium: 8.3 mg/dL — ABNORMAL LOW (ref 8.9–10.3)
Chloride: 107 mmol/L (ref 98–111)
Creatinine, Ser: 0.75 mg/dL (ref 0.44–1.00)
GFR, Estimated: >60 mL/min (ref >60)
Glucose, Bld: 113 mg/dL — ABNORMAL HIGH (ref 70–99)
Potassium: 3.7 mmol/L (ref 3.5–5.1)
Sodium: 136 mmol/L (ref 135–145)
Total Bilirubin: 0.5 mg/dL (ref 0.3–1.2)
Total Protein: 7.3 g/dL (ref 6.5–8.1)

## 2023-02-15 LAB — CBC
HCT: 37.5 % (ref 36.0–46.0)
Hemoglobin: 12 g/dL (ref 12.0–15.0)
MCH: 27.3 pg (ref 26.0–34.0)
MCHC: 32 g/dL (ref 30.0–36.0)
MCV: 85.4 fL (ref 80.0–100.0)
Platelets: 231 10*3/uL (ref 150–400)
RBC: 4.39 MIL/uL (ref 3.87–5.11)
RDW: 14.7 % (ref 11.5–15.5)
WBC: 9.3 10*3/uL (ref 4.0–10.5)
nRBC: 0 % (ref 0.0–0.2)

## 2023-02-15 LAB — D-DIMER, QUANTITATIVE: D-Dimer, Quant: 0.32 ug/mL-FEU (ref 0.00–0.50)

## 2023-02-15 LAB — LIPASE, BLOOD: Lipase: 32 U/L (ref 11–51)

## 2023-02-15 MED ORDER — ONDANSETRON 4 MG PO TBDP: ORAL_TABLET | ORAL | 0 refills | Status: DC

## 2023-02-15 MED ORDER — IBUPROFEN 800 MG PO TABS
800.0000 mg | ORAL_TABLET | Freq: Once | ORAL | Status: AC
  Administered 2023-02-15: 800 mg via ORAL
  Filled 2023-02-15: qty 1

## 2023-02-15 MED ORDER — OXYCODONE-ACETAMINOPHEN 5-325 MG PO TABS
1.0000 | ORAL_TABLET | Freq: Once | ORAL | Status: AC
  Administered 2023-02-15: 1 via ORAL
  Filled 2023-02-15: qty 1

## 2023-02-15 NOTE — ED Triage Notes (Signed)
Pt c/o bilateral lower extremity pain, states she always has pain but it is significantly worse today causing her to be nauseous. Reports 1 episode of emesis. Denies any recent injury.

## 2023-02-15 NOTE — ED Provider Notes (Signed)
Alison Mendoza Provider Note   CSN: [id number] Arrival date & time: 02/15/23  0041     History  Chief Complaint  Patient presents with   Leg Pain   Nausea    Alison Mendoza is a 42 y.o. female.  Presents to the emergency department with multiple problems.  Patient reports that she is experiencing right lower leg pain.  She says this has been ongoing for about 6 months.  She often takes Tylenol or has her husband rubbed the area but tonight the pain worsened.  No new injury.  She reports abdominal pain, cramping, nausea and vomiting tonight.  She thinks it was because of the increased pain in the leg.       Home Medications Prior to Admission medications   Medication Sig Start Date End Date Taking? Authorizing Provider  ondansetron (ZOFRAN-ODT) 4 MG disintegrating tablet 4mg  ODT q4 hours prn nausea/vomit 02/15/23  Yes Alison Mendoza, Alison Brim, MD  PRESCRIPTION MEDICATION Take 1 tablet by mouth daily. For GERD - pt unsure which one it is, but states her GI prescribes it    [provider]  promethazine (PHENERGAN) 25 MG tablet Take 1 tablet (25 mg total) by mouth every 6 (six) hours as needed for nausea or vomiting. 01/11/23   Alison Boop, MD      Allergies    Patient has no known allergies.    Review of Systems   Review of Systems  Physical Exam Updated Vital Signs BP (!) 137/90   Pulse 67   Temp 98 F (36.7 C) (Oral)   Resp 20   Ht 4\' 11"  (1.499 m)   Wt 85 kg   SpO2 99%   BMI 37.85 kg/m  Physical Exam Vitals and nursing note reviewed.  Constitutional:      General: She is not in acute distress.    Appearance: She is well-developed.  HENT:     Head: Normocephalic and atraumatic.     Mouth/Throat:     Mouth: Mucous membranes are moist.  Eyes:     General: Vision grossly intact. Gaze aligned appropriately.     Extraocular Movements: Extraocular movements intact.     Conjunctiva/sclera: Conjunctivae  normal.  Cardiovascular:     Rate and Rhythm: Normal rate and regular rhythm.     Pulses:          Dorsalis pedis pulses are 1+ on the right side and 1+ on the left side.     Heart sounds: Normal heart sounds, S1 normal and S2 normal. No murmur heard.    No friction rub. No gallop.  Pulmonary:     Effort: Pulmonary effort is normal. No respiratory distress.     Breath sounds: Normal breath sounds.  Abdominal:     General: Bowel sounds are normal.     Palpations: Abdomen is soft.     Tenderness: There is no abdominal tenderness. There is no guarding or rebound.     Hernia: No hernia is present.  Musculoskeletal:        General: No swelling.     Cervical back: Full passive range of motion without pain, normal range of motion and neck supple. No spinous process tenderness or muscular tenderness. Normal range of motion.     Right lower leg: Tenderness present. No swelling. No edema.     Left lower leg: No edema.  Skin:    General: Skin is warm and dry.     Capillary Refill:  Capillary refill takes less than 2 seconds.     Findings: No ecchymosis, erythema, rash or wound.  Neurological:     General: No focal deficit present.     Mental Status: She is alert and oriented to person, place, and time.     GCS: GCS eye subscore is 4. GCS verbal subscore is 5. GCS motor subscore is 6.     Cranial Nerves: Cranial nerves 2-12 are intact.     Sensory: Sensation is intact.     Motor: Motor function is intact.     Coordination: Coordination is intact.  Psychiatric:        Attention and Perception: Attention normal.        Mood and Affect: Mood normal.        Speech: Speech normal.        Behavior: Behavior normal.     ED Results / Procedures / Treatments   Labs (all labs ordered are listed, but only abnormal results are displayed) Labs Reviewed  COMPREHENSIVE METABOLIC PANEL - Abnormal; Notable for the following components:      Result Value   Glucose, Bld 113 (*)    Calcium 8.3 (*)     All other components within normal limits  LIPASE, BLOOD  CBC  D-DIMER, QUANTITATIVE  URINALYSIS, ROUTINE W REFLEX MICROSCOPIC  POC URINE PREG, ED    EKG None  Radiology No results found.  Procedures Procedures    Medications Ordered in ED Medications  oxyCODONE-acetaminophen (PERCOCET/ROXICET) 5-325 MG per tablet 1 tablet (1 tablet Oral Given 02/15/23 0301)  ibuprofen (ADVIL) tablet 800 mg (800 mg Oral Given 02/15/23 0301)    ED Course/ Medical Decision Making/ A&P                             Medical Decision Making Amount and/or Complexity of Data Reviewed Labs: ordered.  Risk Prescription drug management.   Presents with multiple problems.  Patient with generalized abdominal pain, nausea and vomiting.  Reviewing her records, she does appear to have some chronic abdominal complaints.  She sees GI for this and recently was referred to gynecology for her lower abdominal pain.  She was treated for H. pylori and it was felt that this was cleared.  Exam today reveals some mild tenderness diffusely but no guarding, rebound or focal tenderness that would suggest acute surgical process.  Patient also complaining of right lower leg pain.  No overlying skin changes to suggest infection.  Palpable pulses so no evidence of ischemia.  No significant swelling of the calf, negative Homans.  D-dimer is negative.  DVT not considered likely based on this workup.        Final Clinical Impression(s) / ED Diagnoses Final diagnoses:  Right leg pain  Generalized abdominal pain  Nausea and vomiting, unspecified vomiting type    Rx / DC Orders ED Discharge Orders          Ordered    ondansetron (ZOFRAN-ODT) 4 MG disintegrating tablet        02/15/23 0352              Gilda Crease, MD 02/15/23 [phone]

## 2023-02-19 ENCOUNTER — Ambulatory Visit (HOSPITAL_COMMUNITY)
Admission: RE | Admit: 2023-02-19 | Discharge: 2023-02-19 | Disposition: A | Discharge status: Home / Self Care | Payer: Self-pay | Source: Ambulatory Visit | Attending: Obstetrics and Gynecology | Admitting: Obstetrics and Gynecology

## 2023-02-19 DIAGNOSIS — Z1231 Encounter for screening mammogram for malignant neoplasm of breast: Secondary | ICD-10-CM | POA: Insufficient documentation

## 2023-02-20 ENCOUNTER — Ambulatory Visit: Payer: Self-pay | Admitting: Physician Assistant

## 2023-02-20 ENCOUNTER — Telehealth: Payer: Self-pay

## 2023-02-20 ENCOUNTER — Encounter: Payer: Self-pay | Admitting: Physician Assistant

## 2023-02-20 VITALS — BP 117/71 | HR 75 | Temp 98.0°F | Wt 187.5 lb

## 2023-02-20 DIAGNOSIS — Z789 Other specified health status: Secondary | ICD-10-CM

## 2023-02-20 DIAGNOSIS — R9389 Abnormal findings on diagnostic imaging of other specified body structures: Secondary | ICD-10-CM

## 2023-02-20 DIAGNOSIS — N946 Dysmenorrhea, unspecified: Secondary | ICD-10-CM | POA: Insufficient documentation

## 2023-02-20 LAB — POCT URINE PREGNANCY: Preg Test, Ur: NEGATIVE

## 2023-02-20 MED ORDER — MEDROXYPROGESTERONE ACETATE 150 MG/ML IM SUSY
150.0000 mg | PREFILLED_SYRINGE | Freq: Once | INTRAMUSCULAR | Status: AC
Start: 2023-02-20 — End: 2023-02-20
  Administered 2023-02-20: 150 mg via INTRAMUSCULAR

## 2023-02-20 NOTE — Patient Instructions (Addendum)
Imagen de Oncologist (IRM) el viernes 27 de junio  En el hospital [facility] llegar a las 7:45am -Nada de comer o beber el dia del examen -Cena ligera la noche anterior (pollo o pes, no comidas rapidas, no comidas que le den gas)   MRI- Friday 27 June [facility] Arrive 7:45am -Nothing to eat or drink day of test -light dinner night before (chicken or fish, no fast food, no foods that will give you gas) ----------------------------------------------   Fibromas uterinos Uterine Fibroids  Los fibromas uterinos, tambin llamados liomiomas, son tumores no cancerosos (benignos) que pueden Solicitor. Pueden causar sangrado menstrual abundante y Engineer, mining. Los fibromas tambin pueden desarrollarse en las trompas de Falopio, el cuello uterino o los tejidos (ligamentos) cerca del tero. Puede tener uno o muchos fibromas. Los fibromas tienen diferentes tamaos y pesos, y crecen en distintas partes del tero. Algunos pueden crecer hasta volverse bastante grandes. La mayora de los fibromas no requiere tratamiento mdico. Cules son las causas? Se desconoce la causa de esta afeccin. Qu incrementa el riesgo? Es ms probable que tenga esta afeccin si: Tiene entre 30 y 28 aos de edad y no ha [employer]. Tienen antecedentes familiares de esta afeccin. Tienen ascendencia afroamericana. Inici su perodo menstrual a los 10 aos de edad o [place]. No ha tenido hijos. Tiene sobrepeso u obesidad. Cules son los signos o sntomas? Muchas mujeres no tienen sntomas. Los sntomas de esta afeccin pueden incluir los siguientes: Hemorragias menstruales abundantes. Sangrado entre los perodos [employer]. Dolor y presin en la zona plvica, entre los huesos de la cadera. Dolor al [employer]. Problemas en la vejiga, como necesidad de orinar de inmediato u orinar con ms frecuencia que lo habitual. Incapacidad para tener hijos (infertilidad). Imposibilidad  de llevar un embarazo a trmino (aborto espontneo). Cmo se diagnostica? Esta afeccin se puede diagnosticar en funcin de lo siguiente: Los sntomas y los antecedentes mdicos. Un examen fsico. Un examen plvico que incluye la palpacin para detectar tumores. Estudios de diagnstico por imgenes, como una ecografa o una resonancia magntica (RM). Cmo se trata? El tratamiento para esta afeccin puede incluir visitas de seguimiento con su mdico para controlar si hubo cambios en los fibromas. Otro tipo tratamiento puede incluir: Medicamentos, como por ejemplo: Medicamentos para Engineer, materials, entre ellos aspirina y antiinflamatorios no esteroideos (AINE), como ibuprofeno o naproxeno. Terapia hormonal. El tratamiento puede administrarse en forma de pldora o de inyeccin, o puede insertarse en el tero mediante un dispositivo intrauterino (DIU). Una ciruga que hara una de estas cosas: Extirpar los fibromas (miomectoma). Puede recomendarse esta opcin si los fibromas afectan su fertilidad y tiene deseos de [place]. Extirpar el tero (histerectoma). Bloquear la irrigacin sangunea a los fibromas (embolizacin de la arteria uterina). Esto puede hacer que se encojan y [place]. Siga estas instrucciones en su casa: Medicamentos [employer] de venta libre y los recetados solamente como se lo haya indicado el mdico. Consulte a su mdico si debe tomar comprimidos de hierro o comer ms alimentos ricos en hierro, como verduras de hojas de color verde oscuro. El sangrado menstrual excesivo pueden causar niveles bajos de hierro. Control del dolor Si se lo indican, aplique calor en la espalda o el abdomen para Engineer, materials. Use la fuente de calor que el mdico le recomiende, como una compresa de calor hmedo o una almohadilla trmica. Para aplicar calor: Coloque una toalla entre la piel y la fuente de Airline pilot. Aplique calor durante  20 a 30 minutos. Retire la fuente de  calor si la piel se pone de color rojo brillante. Esto es especialmente importante si no puede sentir dolor, calor o fro. Puede correr un mayor riesgo de sufrir quemaduras.  Instrucciones generales Preste mucha atencin a su ciclo menstrual. Informe al mdico si nota algn cambio, por ejemplo: Hemorragia ms intensa que requiere que cambie las compresas o los tampones ms de lo habitual. Un cambio en el nmero de das que le dura el perodo menstrual. Un cambio en los sntomas asociados con el perodo menstrual, como dolor de espalda o clicos abdominales. Cumpla con todas las visitas de seguimiento. Esto es importante, especialmente si los fibromas deben controlarse para Insurance risk surveyor cambio. Comunquese con un mdico si: Tiene dolor plvico, dolor de espalda o clicos abdominales que no se alivian con los medicamentos o al Corporate treasurer. Presenta un nuevo sangrado entre los [employer]. Observa un aumento del sangrado durante o entre los perodos [place]. Se siente ms dbil o cansado que de costumbre. Se siente mareado. Solicite ayuda de inmediato si: Se desmaya. Tiene un dolor plvico que empeora sbitamente. Tiene sangrado vaginal intenso que empapa un tampn o un apsito en 30 minutos o menos. Resumen Los fibromas uterinos son tumores no cancerosos (benignos) que pueden Solicitor. Se desconoce la causa exacta de esta afeccin. La mayora de los fibromas no necesitan tratamiento mdico, a menos que afecten su capacidad para tener hijos (fertilidad). Comunquese con el mdico si tiene dolor plvico, dolor de espalda o clicos abdominales que no se alivian con medicamentos. Obtenga ayuda de inmediato si se desmaya, tiene dolor plvico que empeora repentinamente o tiene una hemorragia vaginal intensa. Esta informacin no tiene Theme park manager el consejo del mdico. Asegrese de hacerle al mdico cualquier pregunta que tenga. Document Revised:  05/25/2020 Document Reviewed: 05/25/2020 Elsevier Patient Education  2024 [employer].

## 2023-02-20 NOTE — Progress Notes (Signed)
   BP 117/71   Pulse 75   Temp 98 F (36.7 C)   Wt 187 lb 8 oz (85 kg)   LMP 02/15/2023   SpO2 98%   BMI 37.87 kg/m    Subjective:    Patient ID: Alison Mendoza, female    DOB: 1981-01-01, 42 y.o.   MRN: [id number]  HPI: Alison Mendoza is a 42 y.o. female presenting on 02/20/2023 for No chief complaint on file.   HPI    Pt is in today today to follow up dysmenorrhea.  At previous OV we discussed options for contraception including risks and benefits and she selected depo-provera.  Her Last intercourse 2 week.  She did use a condom.  She says she  always uses condom.  LMP- May 30  She updated her cafa  Relevant past medical, surgical, family and social history reviewed and updated as indicated. Interim medical history since our last visit reviewed. Allergies and medications reviewed and updated.   Current Outpatient Medications:    ondansetron (ZOFRAN-ODT) 4 MG disintegrating tablet, 4mg  ODT q4 hours prn nausea/vomit, Disp: 10 tablet, Rfl: 0   PRESCRIPTION MEDICATION, Take 1 tablet by mouth daily. For GERD - pt unsure which one it is, but states her GI prescribes it, Disp: , Rfl:    promethazine (PHENERGAN) 25 MG tablet, Take 1 tablet (25 mg total) by mouth every 6 (six) hours as needed for nausea or vomiting., Disp: 30 tablet, Rfl: 0    Review of Systems  Per HPI unless specifically indicated above     Objective:    BP 117/71   Pulse 75   Temp 98 F (36.7 C)   Wt 187 lb 8 oz (85 kg)   LMP 02/15/2023   SpO2 98%   BMI 37.87 kg/m   Wt Readings from Last 3 Encounters:  02/20/23 187 lb 8 oz (85 kg)  02/15/23 187 lb 6.3 oz (85 kg)  02/08/23 189 lb (85.7 kg)    Physical Exam Constitutional:      General: She is not in acute distress.    Appearance: She is not toxic-appearing.  HENT:     Head: Normocephalic and atraumatic.  Pulmonary:     Effort: Pulmonary effort is normal. No respiratory distress.  Neurological:     Mental Status: She is alert and  oriented to person, place, and time.  Psychiatric:        Attention and Perception: Attention normal.        Speech: Speech normal.        Behavior: Behavior normal. Behavior is cooperative.       Results for orders placed or performed in visit on 02/20/23  POCT urine pregnancy  Result Value Ref Range   Preg Test, Ur Negative Negative      Assessment & Plan:    Encounter Diagnoses  Name Primary?   Dysmenorrhea Yes   Abnormal ultrasound    Not proficient in English language       -discussed results pelvic US.  MRI ordered -depo-provera given in office today -pt will follow up 1 month after her MRI.  She is to contact office sooner prn worsening or new symptoms

## 2023-02-21 ENCOUNTER — Ambulatory Visit: Payer: Self-pay | Admitting: Physician Assistant

## 2023-02-21 ENCOUNTER — Ambulatory Visit: Payer: Self-pay | Admitting: Internal Medicine

## 2023-02-21 NOTE — Telephone Encounter (Signed)
Completed a hospital f/u nurse case management  call as a result of the pt being seen at the [facility] ER on 5.30.24.  I  Successully spoke with patient by way of [employer]   Pt  states she has no current related medical needs as it relates to obtaining medications or being assisted with being connected to any other provider appointments or accessing other community related resources to ensure her health care stays on track .  States she had follow appt with her PCP on today (6.4.24) since being released.   She states her pelvic pain level continues to exist since being seen and released by the ER and has not taken anything specifically for pain relief.     However she states she's taking tylenol for which she states " I think I had a fever, so I took some tylenol to help fever".   Pt was asked to confirm her reading of her temperature Prior to taking the tylenol and she said it was 98.0.        PLAN   Pt was advised that her temperature reading was WNL, but was educated on what is a considered to be an abnormal reading for her to observe for the future.    She was advised to check temperature again and if reading is over 100, she would need to contact her provider if it becomes unchanged by utilizing her tylenol   Pt was also advised regarding her pelvic pain level and if unchanged, to contact her PCP to receive additional advice regarding management of her pain.   She states she does have an appointment on March 15, 2023 for MRI at [facility].     Pt  seems to stay connected with her PCP at the [facility] with her next appt scheduled on 03/21/23   Pt was re-educated on the use of Urgent vs Emergency room in the event his PCP office is not available or open for business.    Urgent vs emergency conditions were also reviewed with the patient.    Pt states she understood and advised to contact me back if she has additional questions/concerns  [phone])  and  call ended

## 2023-02-22 ENCOUNTER — Other Ambulatory Visit (HOSPITAL_COMMUNITY): Payer: Self-pay

## 2023-03-07 ENCOUNTER — Ambulatory Visit: Payer: Self-pay

## 2023-03-07 ENCOUNTER — Other Ambulatory Visit: Payer: Self-pay

## 2023-03-15 ENCOUNTER — Ambulatory Visit (HOSPITAL_COMMUNITY)
Admission: RE | Admit: 2023-03-15 | Discharge: 2023-03-15 | Disposition: A | Discharge status: Home / Self Care | Payer: Self-pay | Source: Ambulatory Visit | Attending: Physician Assistant | Admitting: Physician Assistant

## 2023-03-15 DIAGNOSIS — N946 Dysmenorrhea, unspecified: Secondary | ICD-10-CM | POA: Insufficient documentation

## 2023-03-15 DIAGNOSIS — R9389 Abnormal findings on diagnostic imaging of other specified body structures: Secondary | ICD-10-CM | POA: Insufficient documentation

## 2023-03-15 MED ORDER — GADOBUTROL 1 MMOL/ML IV SOLN
10.0000 mL | Freq: Once | INTRAVENOUS | Status: AC | PRN
  Administered 2023-03-15: 10 mL via INTRAVENOUS

## 2023-03-21 ENCOUNTER — Ambulatory Visit: Payer: Self-pay | Admitting: Physician Assistant

## 2023-03-21 ENCOUNTER — Encounter: Payer: Self-pay | Admitting: Physician Assistant

## 2023-03-21 VITALS — BP 115/60 | HR 79 | Temp 98.0°F | Ht 59.0 in | Wt 185.0 lb

## 2023-03-21 DIAGNOSIS — R300 Dysuria: Secondary | ICD-10-CM

## 2023-03-21 DIAGNOSIS — N946 Dysmenorrhea, unspecified: Secondary | ICD-10-CM

## 2023-03-21 DIAGNOSIS — D219 Benign neoplasm of connective and other soft tissue, unspecified: Secondary | ICD-10-CM

## 2023-03-21 DIAGNOSIS — Z789 Other specified health status: Secondary | ICD-10-CM

## 2023-03-21 LAB — POCT URINALYSIS DIPSTICK
Blood, UA: NEGATIVE
Glucose, UA: NEGATIVE
Ketones, UA: NEGATIVE
Leukocytes, UA: NEGATIVE
Nitrite, UA: NEGATIVE
Protein, UA: POSITIVE — AB
Spec Grav, UA: >=1.03 — AB (ref 1.010–1.025)
Urobilinogen, UA: NEGATIVE E.U./dL — AB
pH, UA: 6 (ref 5.0–8.0)

## 2023-03-21 MED ORDER — CEPHALEXIN 500 MG PO CAPS: 500.0000 mg | ORAL_CAPSULE | Freq: Four times a day (QID) | ORAL | 0 refills | Status: AC

## 2023-03-21 NOTE — Patient Instructions (Signed)
Fibromas uterinos Uterine Fibroids  Los fibromas uterinos, tambin llamados liomiomas, son tumores no cancerosos (benignos) que pueden desarrollarse en el tero. Pueden causar sangrado menstrual abundante y dolor. Los fibromas tambin pueden desarrollarse en las trompas de Falopio, el cuello uterino o los tejidos (ligamentos) cerca del tero. Puede tener uno o muchos fibromas. Los fibromas tienen diferentes tamaos y pesos, y crecen en distintas partes del tero. Algunos pueden crecer hasta volverse bastante grandes. La mayora de los fibromas no requiere tratamiento mdico. Cules son las causas? Se desconoce la causa de esta afeccin. Qu incrementa el riesgo? Es ms probable que tenga esta afeccin si: Tiene entre 30 y 40 aos de edad y no ha atravesado la menopausia. Tienen antecedentes familiares de esta afeccin. Tienen ascendencia afroamericana. Inici su perodo menstrual a los 10 aos de edad o menos. No ha tenido hijos. Tiene sobrepeso u obesidad. Cules son los signos o sntomas? Muchas mujeres no tienen sntomas. Los sntomas de esta afeccin pueden incluir los siguientes: Hemorragias menstruales abundantes. Sangrado entre los perodos menstruales. Dolor y presin en la zona plvica, entre los huesos de la cadera. Dolor al tener sexo. Problemas en la vejiga, como necesidad de orinar de inmediato u orinar con ms frecuencia que lo habitual. Incapacidad para tener hijos (infertilidad). Imposibilidad de llevar un embarazo a trmino (aborto espontneo). Cmo se diagnostica? Esta afeccin se puede diagnosticar en funcin de lo siguiente: Los sntomas y los antecedentes mdicos. Un examen fsico. Un examen plvico que incluye la palpacin para detectar tumores. Estudios de diagnstico por imgenes, como una ecografa o una resonancia magntica (RM). Cmo se trata? El tratamiento para esta afeccin puede incluir visitas de seguimiento con su mdico para controlar si hubo  cambios en los fibromas. Otro tipo tratamiento puede incluir: Medicamentos, como por ejemplo: Medicamentos para aliviar el dolor, entre ellos aspirina y antiinflamatorios no esteroideos (AINE), como ibuprofeno o naproxeno. Terapia hormonal. El tratamiento puede administrarse en forma de pldora o de inyeccin, o puede insertarse en el tero mediante un dispositivo intrauterino (DIU). Una ciruga que hara una de estas cosas: Extirpar los fibromas (miomectoma). Puede recomendarse esta opcin si los fibromas afectan su fertilidad y tiene deseos de quedar embarazada. Extirpar el tero (histerectoma). Bloquear la irrigacin sangunea a los fibromas (embolizacin de la arteria uterina). Esto puede hacer que se encojan y mueran. Siga estas instrucciones en su casa: Medicamentos [place] los medicamentos de venta libre y los recetados solamente como se lo haya indicado el mdico. Consulte a su mdico si debe tomar comprimidos de hierro o comer ms alimentos ricos en hierro, como verduras de hojas de color verde oscuro. El sangrado menstrual excesivo pueden causar niveles bajos de hierro. Control del dolor Si se lo indican, aplique calor en la espalda o el abdomen para aliviar el dolor. Use la fuente de calor que el mdico le recomiende, como una compresa de calor hmedo o una almohadilla trmica. Para aplicar calor: Coloque una toalla entre la piel y la fuente de calor. Aplique calor durante 20 a 30 minutos. Retire la fuente de calor si la piel se pone de color rojo brillante. Esto es especialmente importante si no puede sentir dolor, calor o fro. Puede correr un mayor riesgo de sufrir quemaduras.  Instrucciones generales Preste mucha atencin a su ciclo menstrual. Informe al mdico si nota algn cambio, por ejemplo: Hemorragia ms intensa que requiere que cambie las compresas o los tampones ms de lo habitual. Un cambio en el nmero de das que le dura el perodo   menstrual. Un cambio en los sntomas  asociados con el perodo menstrual, como dolor de espalda o clicos abdominales. Cumpla con todas las visitas de seguimiento. Esto es importante, especialmente si los fibromas deben controlarse para Insurance risk surveyor cambio. Comunquese con un mdico si: Tiene dolor plvico, dolor de espalda o clicos abdominales que no se alivian con los medicamentos o al Corporate treasurer. Presenta un nuevo sangrado entre los [employer]. Observa un aumento del sangrado durante o entre los perodos [place]. Se siente ms dbil o cansado que de costumbre. Se siente mareado. Solicite ayuda de inmediato si: Se desmaya. Tiene un dolor plvico que empeora sbitamente. Tiene sangrado vaginal intenso que empapa un tampn o un apsito en 30 minutos o menos. Resumen Los fibromas uterinos son tumores no cancerosos (benignos) que pueden Solicitor. Se desconoce la causa exacta de esta afeccin. La mayora de los fibromas no necesitan tratamiento mdico, a menos que afecten su capacidad para tener hijos (fertilidad). Comunquese con el mdico si tiene dolor plvico, dolor de espalda o clicos abdominales que no se alivian con medicamentos. Obtenga ayuda de inmediato si se desmaya, tiene dolor plvico que empeora repentinamente o tiene una hemorragia vaginal intensa. Esta informacin no tiene Theme park manager el consejo del mdico. Asegrese de hacerle al mdico cualquier pregunta que tenga. Document Revised: 05/25/2020 Document Reviewed: 05/25/2020 Elsevier Patient Education  2024 [employer].

## 2023-03-21 NOTE — Progress Notes (Signed)
BP 115/60   Pulse 79   Temp 98 F (36.7 C)   Ht 4\' 11"  (1.499 m)   Wt 185 lb (83.9 kg)   LMP 02/15/2023   SpO2 97%   BMI 37.37 kg/m    Subjective:    Patient ID: Alison Mendoza, female    DOB: Jun 22, 1981, 42 y.o.   MRN: [id number]  HPI: Alison Mendoza is a 42 y.o. female presenting on 03/21/2023 for Dysmenorrhea (Pt has been having nausea and chills. Pt states she bleed a little some one Friday and Saturday pt noticed the blood when she wiped. Pt states she has been feeling extra tired and sleepy, pt is unsure if it is a side affect from Depo) and Dysuria (Pt c/o burning when urinating, urinary frequency, and lower back pain that started Thursday. )   HPI   Chief Complaint  Patient presents with   Dysmenorrhea    Pt has been having nausea and chills. Pt states she bleed a little some one Friday and Saturday pt noticed the blood when she wiped. Pt states she has been feeling extra tired and sleepy, pt is unsure if it is a side affect from Depo   Dysuria    Pt c/o burning when urinating, urinary frequency, and lower back pain that started Thursday.     She is still using nausea medication.  Sometimes daily.    She ran out of med given by GI that she was supposed to get refilled but doesn't know name of it.    She says stomach feels better but she has nausea and chills.   Her bleeding and pelvic pain is improved.      Relevant past medical, surgical, family and social history reviewed and updated as indicated. Interim medical history since our last visit reviewed. Allergies and medications reviewed and updated.    Current Outpatient Medications:    medroxyPROGESTERone Acetate (DEPO-PROVERA IM), Inject into the muscle., Disp: , Rfl:    ondansetron (ZOFRAN-ODT) 4 MG disintegrating tablet, 4mg  ODT q4 hours prn nausea/vomit, Disp: 10 tablet, Rfl: 0   promethazine (PHENERGAN) 25 MG tablet, Take 1 tablet (25 mg total) by mouth every 6 (six) hours as needed for nausea or  vomiting., Disp: 30 tablet, Rfl: 0   PRESCRIPTION MEDICATION, Take 1 tablet by mouth daily. For GERD - pt unsure which one it is, but states her GI prescribes it (Patient not taking: Reported on 03/21/2023), Disp: , Rfl:     Review of Systems  Per HPI unless specifically indicated above     Objective:    BP 115/60   Pulse 79   Temp 98 F (36.7 C)   Ht 4\' 11"  (1.499 m)   Wt 185 lb (83.9 kg)   LMP 02/15/2023   SpO2 97%   BMI 37.37 kg/m   Wt Readings from Last 3 Encounters:  03/21/23 185 lb (83.9 kg)  02/20/23 187 lb 8 oz (85 kg)  02/15/23 187 lb 6.3 oz (85 kg)    Physical Exam Vitals reviewed.  Constitutional:      General: She is not in acute distress.    Appearance: She is well-developed. She is not toxic-appearing.  HENT:     Head: Normocephalic and atraumatic.  Cardiovascular:     Rate and Rhythm: Normal rate and regular rhythm.  Pulmonary:     Effort: Pulmonary effort is normal.     Breath sounds: Normal breath sounds.  Abdominal:     General: Bowel sounds are normal.  Palpations: Abdomen is soft. There is no hepatomegaly, splenomegaly, mass or pulsatile mass.     Tenderness: There is abdominal tenderness. There is no guarding or rebound.  Musculoskeletal:     Cervical back: Neck supple.     Right lower leg: No edema.     Left lower leg: No edema.  Lymphadenopathy:     Cervical: No cervical adenopathy.  Skin:    General: Skin is warm and dry.  Neurological:     Mental Status: She is alert and oriented to person, place, and time.  Psychiatric:        Behavior: Behavior normal.     Results for orders placed or performed in visit on 03/21/23  POCT Urinalysis Dipstick  Result Value Ref Range   Color, UA YELLOW    Clarity, UA CLEAR    Glucose, UA Negative Negative   Bilirubin, UA SMALL    Ketones, UA NEG    Spec Grav, UA >=1.030 (A) 1.010 - 1.025   Blood, UA NEG    pH, UA 6.0 5.0 - 8.0   Protein, UA Positive (A) Negative   Urobilinogen, UA negative  (A) 0.2 or 1.0 E.U./dL   Nitrite, UA NEG    Leukocytes, UA Negative Negative   Appearance     Odor        Assessment & Plan:    Encounter Diagnoses  Name Primary?   Fibroids Yes   Dysuria    Dysmenorrhea    Not proficient in English language       -Reviewed results MRI -Counseled on fibroids and gave reading materials -rx keflex for cystitis -pt to RTO if symptoms fail to resolve -f/u for depo end of August -F/u with provider February as scheduled

## 2023-04-16 ENCOUNTER — Other Ambulatory Visit (HOSPITAL_COMMUNITY)
Admission: RE | Admit: 2023-04-16 | Discharge: 2023-04-16 | Disposition: A | Discharge status: Home / Self Care | Payer: Self-pay | Source: Ambulatory Visit | Attending: Physician Assistant | Admitting: Physician Assistant

## 2023-04-16 ENCOUNTER — Encounter: Payer: Self-pay | Admitting: Physician Assistant

## 2023-04-16 ENCOUNTER — Ambulatory Visit: Payer: Self-pay | Admitting: Physician Assistant

## 2023-04-16 VITALS — BP 139/86 | HR 69 | Temp 97.7°F | Wt 184.0 lb

## 2023-04-16 DIAGNOSIS — R300 Dysuria: Secondary | ICD-10-CM

## 2023-04-16 DIAGNOSIS — N946 Dysmenorrhea, unspecified: Secondary | ICD-10-CM | POA: Insufficient documentation

## 2023-04-16 DIAGNOSIS — D219 Benign neoplasm of connective and other soft tissue, unspecified: Secondary | ICD-10-CM

## 2023-04-16 DIAGNOSIS — Z789 Other specified health status: Secondary | ICD-10-CM

## 2023-04-16 DIAGNOSIS — R1084 Generalized abdominal pain: Secondary | ICD-10-CM

## 2023-04-16 LAB — POCT URINALYSIS DIPSTICK
Glucose, UA: NEGATIVE
Ketones, UA: NEGATIVE
Leukocytes, UA: NEGATIVE
Nitrite, UA: NEGATIVE
Spec Grav, UA: >=1.03 — AB (ref 1.010–1.025)
pH, UA: 5 (ref 5.0–8.0)

## 2023-04-16 LAB — CBC
HCT: 38.8 % (ref 36.0–46.0)
Hemoglobin: 12.4 g/dL (ref 12.0–15.0)
MCH: 26.7 pg (ref 26.0–34.0)
MCHC: 32 g/dL (ref 30.0–36.0)
MCV: 83.6 fL (ref 80.0–100.0)
Platelets: 234 10*3/uL (ref 150–400)
RBC: 4.64 MIL/uL (ref 3.87–5.11)
RDW: 14.6 % (ref 11.5–15.5)
WBC: 9 10*3/uL (ref 4.0–10.5)
nRBC: 0 % (ref 0.0–0.2)

## 2023-04-16 MED ORDER — PROMETHAZINE HCL 25 MG PO TABS: 25.0000 mg | ORAL_TABLET | Freq: Three times a day (TID) | ORAL | 0 refills | Status: DC | PRN

## 2023-04-16 MED ORDER — CYCLOBENZAPRINE HCL 10 MG PO TABS: 10.0000 mg | ORAL_TABLET | Freq: Three times a day (TID) | ORAL | 0 refills | Status: DC | PRN

## 2023-04-16 NOTE — Progress Notes (Signed)
BP 139/86   Pulse 69   Temp 97.7 F (36.5 C)   Wt 184 lb (83.5 kg)   SpO2 99%   BMI 37.16 kg/m    Subjective:    Patient ID: Alison Mendoza, female    DOB: 1980-11-27, 42 y.o.   MRN: [id number]  HPI: Alison Mendoza is a 42 y.o. female presenting on 04/16/2023 for No chief complaint on file.   HPI  Pt in today with pain and bleeding.  She has been bleeding for 2 weeks.    Bleeding is constant.   She says the bleeding is a lot but is only changing her pad four times/day.  She has chills and sweats.     She is having a lot of pain as well.   APAP 1g isn't helping her pain.  Sometimes pain will ease up but mostly is constant.    Pain was just a little first week but second week was more and now is in a lot of pain.  Rates 9/10.   She is having some nausea.   She is still having burning with urination that feels "prickly" BMs normal and regular.   [place] 02/15/23 inconclusive recommended MRI which was done 6/27 and showed fibroids She got depo-provera in office on 02/20/2023.   She is taking something for nausea but doent' know shich med   Relevant past medical, surgical, family and social history reviewed and updated as indicated. Interim medical history since our last visit reviewed. Allergies and medications reviewed and updated.    Current Outpatient Medications:    medroxyPROGESTERone Acetate (DEPO-PROVERA IM), Inject into the muscle., Disp: , Rfl:    ondansetron (ZOFRAN-ODT) 4 MG disintegrating tablet, 4mg  ODT q4 hours prn nausea/vomit, Disp: 10 tablet, Rfl: 0   PRESCRIPTION MEDICATION, Take 1 tablet by mouth daily. For GERD - pt unsure which one it is, but states her GI prescribes it (Patient not taking: Reported on 03/21/2023), Disp: , Rfl:    promethazine (PHENERGAN) 25 MG tablet, Take 1 tablet (25 mg total) by mouth every 6 (six) hours as needed for nausea or vomiting., Disp: 30 tablet, Rfl: 0   Review of Systems  Per HPI unless specifically indicated above      Objective:    BP 139/86   Pulse 69   Temp 97.7 F (36.5 C)   Wt 184 lb (83.5 kg)   SpO2 99%   BMI 37.16 kg/m   Wt Readings from Last 3 Encounters:  04/16/23 184 lb (83.5 kg)  03/21/23 185 lb (83.9 kg)  02/20/23 187 lb 8 oz (85 kg)    Physical Exam Vitals reviewed.  Constitutional:      General: She is not in acute distress.    Appearance: She is well-developed. She is obese. She is not toxic-appearing.  HENT:     Head: Normocephalic and atraumatic.  Cardiovascular:     Rate and Rhythm: Normal rate and regular rhythm.  Pulmonary:     Effort: Pulmonary effort is normal.     Breath sounds: Normal breath sounds.  Abdominal:     General: Bowel sounds are normal.     Palpations: Abdomen is soft. There is no hepatomegaly, splenomegaly, mass or pulsatile mass.     Tenderness: There is generalized abdominal tenderness. There is no guarding or rebound.     Comments: Very mild non-point tenderness  Musculoskeletal:     Cervical back: Neck supple.  Lymphadenopathy:     Cervical: No cervical adenopathy.  Skin:  General: Skin is warm and dry.  Neurological:     Mental Status: She is alert and oriented to person, place, and time.     Gait: Gait normal.  Psychiatric:        Behavior: Behavior normal.     UA unremarkable      Assessment & Plan:   Encounter Diagnoses  Name Primary?   Dysmenorrhea Yes   Fibroids    Not proficient in English language    Dysuria    Generalized abdominal pain       -check CBC -Refer to Gyn -Rx prometh and flexeril- counseled on sedation and avoiding driving -pt has cafa currently

## 2023-04-23 ENCOUNTER — Telehealth: Payer: Self-pay | Admitting: Physician Assistant

## 2023-04-23 MED ORDER — MEGESTROL ACETATE 40 MG PO TABS: ORAL_TABLET | ORAL | 0 refills | Status: DC

## 2023-04-23 NOTE — Telephone Encounter (Signed)
Pt called and spoke with translator Helmut Muster stating she was still having heavy bleeding and pain.  She said the flexeril isn't helping any.  She had cbc last week that was normal.  She did not complain of worse symptoms or new symptoms.  She has appointment with gyn on 05/11/23 for this.  Will call in rx megace to help in the meantime. Pt states understanding

## 2023-05-10 ENCOUNTER — Ambulatory Visit (INDEPENDENT_AMBULATORY_CARE_PROVIDER_SITE_OTHER): Payer: Self-pay | Admitting: Obstetrics & Gynecology

## 2023-05-10 ENCOUNTER — Encounter: Payer: Self-pay | Admitting: Obstetrics & Gynecology

## 2023-05-10 ENCOUNTER — Other Ambulatory Visit (HOSPITAL_COMMUNITY)
Admission: RE | Admit: 2023-05-10 | Discharge: 2023-05-10 | Disposition: A | Discharge status: Home / Self Care | Payer: Self-pay | Source: Ambulatory Visit | Attending: Obstetrics & Gynecology | Admitting: Obstetrics & Gynecology

## 2023-05-10 VITALS — BP 135/88 | HR 72 | Ht 59.0 in | Wt 185.0 lb

## 2023-05-10 DIAGNOSIS — R3989 Other symptoms and signs involving the genitourinary system: Secondary | ICD-10-CM

## 2023-05-10 DIAGNOSIS — D219 Benign neoplasm of connective and other soft tissue, unspecified: Secondary | ICD-10-CM

## 2023-05-10 DIAGNOSIS — N939 Abnormal uterine and vaginal bleeding, unspecified: Secondary | ICD-10-CM

## 2023-05-10 NOTE — Progress Notes (Signed)
GYN VISIT Patient name: Alison Mendoza MRN [id number]  Date of birth: 14-Aug-1981 Chief Complaint:   Dysmenorrhea (Has been bleeding for a whole month)  History of Present Illness:   Alison Mendoza is a 42 y.o. G57P2002 female being seen today for the following concerns:  -AUB: 8 mos ago- noted significant pelvic pain like contractions and change in her periods. Normal period was 3 days changing 4-5 pads per day.  Periods increased to 6-7 days and noted that periods became more frequent every 3 weeks.  Seen by PCP work up including [place] and MRI and in June given Depot.  Depot made her period worse and reports daily bleeding.  Sometimes a small amount, other times, she may have to change her pad 4x per day.  Due to the persistent bleeding, PCP then started patient on megace taper 04/23/2023.  Since being on this medication, she has not seen any improvement of her bleeding.  Aside from the bleeding she continues to note considerable dysmenorrhea.  She is taking Tylenol with minimal improvement.  Contraception: condoms. Last Depot 02/20/2023  C-section x 1 (vertical skin)  MRI: Measures 9.9 x 5.3 by 5.7 cm (volume = 160 cm^3). Prior C-section scar noted. Several tiny intramural fibroids are seen measuring up to 1.6 cm. Two intracavitary fibroids are seen which measure 2.6 cm and 1.2 cm in diameter. Cervix and vagina are unremarkable in appearance.  Normal ovaries bilaterally  04/16/2023: Hgb 12.4  Spanish interpreter present for today's exam Review of Systems:   Pertinent items are noted in HPI Denies fever/chills, dizziness, headaches, visual disturbances, fatigue, shortness of breath, chest pain.   -Notes bloating and discomfort with urination. Pertinent History Reviewed:   Past Surgical History:  Procedure Laterality Date   CESAREAN SECTION  09/18/2001   done in [place]; vertical ext scar; unknown int. scar   COLONOSCOPY     COLPOSCOPY     UPPER GASTROINTESTINAL ENDOSCOPY       Past Medical History:  Diagnosis Date   Abnormal Pap smear    Headache(784.0)    Helicobacter pylori gastritis 12/12/2022   History of worms    3 yrs ago   Hx of adenomatous polyp of colon 12/12/2022   March 2024 diminutive adenoma plus inflammatory polyp recall 7 years 2031   Lymphocytic colitis 05/23/2019   Salmonella 2006   Reviewed problem list, medications and allergies. Physical Assessment:   Vitals:   05/10/23 1531  BP: 135/88  Pulse: 72  Weight: 185 lb (83.9 kg)  Height: 4\' 11"  (1.499 m)  Body mass index is 37.37 kg/m.       Physical Examination:   General appearance: alert, well appearing, and in no distress  Psych: mood appropriate, normal affect  Skin: warm & dry   Cardiovascular: normal heart rate noted  Respiratory: normal respiratory effort, no distress  Abdomen: soft, diffuse lower abdominal tenderness, normal-sized uterus, no masses appreciated   Pelvic: VULVA: normal appearing vulva with no masses, tenderness or lesions, VAGINA: normal appearing vagina with normal color and discharge, no lesions, CERVIX: normal appearing cervix without discharge or lesions, UTERUS: uterus is normal size, shape, consistency ADNEXA: normal adnexa in size, no masses  Discomfort noted throughout pelvic exam  Extremities: no edema   Chaperone: Faith Rogue    Endometrial Biopsy Procedure Note  Pre-operative Diagnosis: AUB  Post-operative Diagnosis: same  Procedure Details   Urine pregnancy test was negative.  The risks (including infection, bleeding, pain, and uterine perforation) and benefits of the procedure  were explained to the patient and Written informed consent was obtained.  Antibiotic prophylaxis against endocarditis was not indicated.   The patient was placed in the dorsal lithotomy position.  Bimanual exam showed the uterus to be in the neutral position.  A speculum inserted in the vagina, and the cervix prepped with betadine.     A single tooth tenaculum  was applied to the anterior lip of the cervix for stabilization.  Os finder was used. Path of endometrium to patient's right- ?LUS fibroid  A Pipelle endometrial aspirator was used to sample the endometrium.  Sample was sent for pathologic examination.  Condition: Stable  Complications: None  Assessment & Plan:  1) AUB, Uterine fibroids -Reviewed management options and recent imaging -Based on inadequate response to hormonal intervention and location of uterine fibroid suspect surgical intervention may be ideal -Discussed hysteroscopy D&C fibroid resection versus hysterectomy -Reviewed risk/benefit of each option as well as recovery of each option -questions/concerns were addressed -Also discussed that should she not desire surgical intervention could also consider alternative medication with Myfembree  -advised to stop Megace and monitor bleeding -plan to follow up in 2 wks to review results of pathology and discuss final management plan -She was advised to use OTC analgesics as needed for mild to moderate pain. She was advised to avoid vaginal intercourse for 48 hours or until the bleeding has completely stopped. -repeat CBC collected today  2) Suspected UTI UA obtained  Orders Placed This Encounter  Procedures   Urinalysis, Routine w reflex microscopic   CBC    Return in about 2 weeks (around 05/24/2023) for with Dr. Charlotta Newton.   Myna Hidalgo, DO Attending Obstetrician & Gynecologist, [facility] for [employer], [facility]

## 2023-05-11 ENCOUNTER — Encounter: Payer: Self-pay | Admitting: Obstetrics & Gynecology

## 2023-05-11 LAB — CBC
Hematocrit: 40.2 % (ref 34.0–46.6)
Hemoglobin: 12.5 g/dL (ref 11.1–15.9)
MCH: 26 pg — ABNORMAL LOW (ref 26.6–33.0)
MCHC: 31.1 g/dL — ABNORMAL LOW (ref 31.5–35.7)
MCV: 84 fL (ref 79–97)
Platelets: 252 10*3/uL (ref 150–450)
RBC: 4.81 x10E6/uL (ref 3.77–5.28)
RDW: 14.3 % (ref 11.7–15.4)
WBC: 9.9 10*3/uL (ref 3.4–10.8)

## 2023-05-14 LAB — SURGICAL PATHOLOGY

## 2023-05-16 ENCOUNTER — Ambulatory Visit: Payer: Self-pay | Admitting: Physician Assistant

## 2023-05-16 ENCOUNTER — Encounter: Payer: Self-pay | Admitting: Physician Assistant

## 2023-05-16 VITALS — BP 122/79 | HR 102 | Temp 97.9°F | Ht 59.0 in | Wt 185.0 lb

## 2023-05-16 DIAGNOSIS — N939 Abnormal uterine and vaginal bleeding, unspecified: Secondary | ICD-10-CM

## 2023-05-16 DIAGNOSIS — Z789 Other specified health status: Secondary | ICD-10-CM

## 2023-05-16 NOTE — Progress Notes (Unsigned)
BP 122/79   Pulse (!) 102   Temp 97.9 F (36.6 C)   Ht 4\' 11"  (1.499 m)   Wt 185 lb (83.9 kg)   SpO2 97%   BMI 37.37 kg/m    Subjective:    Patient ID: Alison Mendoza, female    DOB: Jun 28, 1981, 42 y.o.   MRN: [id number]  HPI: Alison Mendoza is a 42 y.o. female presenting on 05/16/2023 for Follow-up (Pt wants to discuss having "vitamins" prescribed since she is still bleeding and is feeling tired, weak, and very sleepy. )   HPI  Relevant past medical, surgical, family and social history reviewed and updated as indicated. Interim medical history since our last visit reviewed. Allergies and medications reviewed and updated.   Current Outpatient Medications:    cyclobenzaprine (FLEXERIL) 10 MG tablet, Take 1 tablet (10 mg total) by mouth 3 (three) times daily as needed for muscle spasms., Disp: 30 tablet, Rfl: 0   medroxyPROGESTERone Acetate (DEPO-PROVERA IM), Inject into the muscle., Disp: , Rfl:    megestrol (MEGACE) 40 MG tablet, 1 po tid x 5 day then 1 po every day.   Tome una tableta por boca tres veces diarias entonces Tome una tableta por boca diaria, Disp: 30 tablet, Rfl: 0   ondansetron (ZOFRAN-ODT) 4 MG disintegrating tablet, 4mg  ODT q4 hours prn nausea/vomit (Patient not taking: Reported on 05/10/2023), Disp: 10 tablet, Rfl: 0   PRESCRIPTION MEDICATION, Take 1 tablet by mouth daily. For GERD - pt unsure which one it is, but states her GI prescribes it (Patient not taking: Reported on 03/21/2023), Disp: , Rfl:    promethazine (PHENERGAN) 25 MG tablet, Take 1 tablet (25 mg total) by mouth every 8 (eight) hours as needed for nausea or vomiting. (Patient not taking: Reported on 05/10/2023), Disp: 20 tablet, Rfl: 0   Review of Systems  Per HPI unless specifically indicated above     Objective:    BP 122/79   Pulse (!) 102   Temp 97.9 F (36.6 C)   Ht 4\' 11"  (1.499 m)   Wt 185 lb (83.9 kg)   SpO2 97%   BMI 37.37 kg/m   Wt Readings from Last 3 Encounters:   05/16/23 185 lb (83.9 kg)  05/10/23 185 lb (83.9 kg)  04/16/23 184 lb (83.5 kg)    Physical Exam  Results for orders placed or performed in visit on 05/10/23  CBC  Result Value Ref Range   WBC 9.9 3.4 - 10.8 x10E3/uL   RBC 4.81 3.77 - 5.28 x10E6/uL   Hemoglobin 12.5 11.1 - 15.9 g/dL   Hematocrit 62.9 52.8 - 46.6 %   MCV 84 79 - 97 fL   MCH 26.0 (L) 26.6 - 33.0 pg   MCHC 31.1 (L) 31.5 - 35.7 g/dL   RDW 41.3 24.4 - 01.0 %   Platelets 252 150 - 450 x10E3/uL  Surgical pathology( [place]/ [facility])  Result Value Ref Range   SURGICAL PATHOLOGY      SURGICAL PATHOLOGY CASE: [id number] PATIENT: Alison Mendoza Surgical Pathology Report     Clinical History: AUB (cm)     FINAL MICROSCOPIC DIAGNOSIS:  A. ENDOMETRIUM, BIOPSY: Benign endometrial polyp Benign endometrium with marked progestational effect Focal stromal breakdown consistent with bleeding Negative for atypia, hyperplasia and carcinoma   GROSS DESCRIPTION:  Received in formalin are 2 x 2 x 0.3 cm of soft hemorrhagic tissue and clotted blood.  The specimen is entirely submitted in 1 cassette.  (GRP 05/11/2023)   Final  Diagnosis performed by Jerene Bears, MD.   Electronically signed 05/14/2023 Technical and / or Professional components performed at [facility], [address] [place], [place] [address].  Immunohistochemistry Technical component (if applicable) was performed at [facility]. [address], [place], [place] [address].   IMMUNOHISTOCHEMISTRY DISCLAIMER (if applicable): Som e of these immunohistochemical stains may have been developed and the performance characteristics determine by [facility]. Some may not have been cleared or approved by the U.S. Food and Drug Administration. The FDA has determined that such clearance or approval is not necessary. This test is used for clinical purposes. It should not be regarded as  investigational or for research. This laboratory is certified under the Clinical Laboratory Improvement Amendments of 1988 (CLIA-88) as qualified to perform high complexity clinical laboratory testing.  The controls stained appropriately.   IHC stains are performed on formalin fixed, paraffin embedded tissue using a 3,3"diaminobenzidine (DAB) chromogen and Leica Bond Autostainer System. The staining intensity of the nucleus is score manually and is reported as the percentage of tumor cell nuclei demonstrating specific nuclear staining. The specimens are fixed in 10% Neutral Formalin for at least 6 hours and up to  72hrs. These tests are validated on decalcified tissue. Results should be interpreted with caution given the possibility of false negative results on decalcified specimens. Antibody Clones are as follows ER-clone 30F, PR-clone 16, Ki67- clone MM1. Some of these immunohistochemical stains may have been developed and the performance characteristics determined by [facility].       Assessment & Plan:   No problem-specific Assessment & Plan notes found for this encounter.   There are no diagnoses linked to this encounter.  Follow up plan: No follow-ups on file.  Thank you for this information.     She came in today and she explained her options- 1-hysterectomy or 2- a camera going inside to remove everything that doesn't need to be there.  (I think myfembree isn't an option due to cost).  She agreed that she did not want another depo shot.  She says she has decided for option 2.  Thank you for taking care of her and good luck!

## 2023-05-25 ENCOUNTER — Encounter: Payer: Self-pay | Admitting: Obstetrics & Gynecology

## 2023-05-25 ENCOUNTER — Ambulatory Visit (INDEPENDENT_AMBULATORY_CARE_PROVIDER_SITE_OTHER): Payer: Self-pay | Admitting: Obstetrics & Gynecology

## 2023-05-25 VITALS — BP 121/81 | HR 88 | Ht 59.0 in | Wt 185.2 lb

## 2023-05-25 DIAGNOSIS — N939 Abnormal uterine and vaginal bleeding, unspecified: Secondary | ICD-10-CM

## 2023-05-25 DIAGNOSIS — D251 Intramural leiomyoma of uterus: Secondary | ICD-10-CM

## 2023-05-25 DIAGNOSIS — R3989 Other symptoms and signs involving the genitourinary system: Secondary | ICD-10-CM

## 2023-05-25 DIAGNOSIS — R102 Pelvic and perineal pain: Secondary | ICD-10-CM

## 2023-05-25 MED ORDER — NORETHINDRONE ACETATE 5 MG PO TABS
10.0000 mg | ORAL_TABLET | Freq: Every day | ORAL | 3 refills | Status: DC
Start: 2023-05-25 — End: 2023-07-10

## 2023-05-25 NOTE — Progress Notes (Signed)
GYN VISIT Patient name: Alison Mendoza MRN [id number]  Date of birth: Apr 17, 1981 Chief Complaint:   Follow-up  History of Present Illness:   Neetu Boka is a 42 y.o. G57P2002 female being seen today for the following concerns:  Spanish interpreter present  AUB: This has been an ongoing issue that has been managed by both myself and PCP.  In review, she has struggled with irregular bleeding for over the past 6 mos. Menses are now longer than 7 days and more frequent.  She tried Depot; however, bleeding worsened and noted intermittent heavy bleeding that will barely stop for a few days before her bleeding will return.  She tried a Megace taper for about 2 wks, which made her bleeding worse.  She is currently on the Megace daily and notes that over the weekend she was soaking through multiple pads in under an hour.  She notes considerable cramping and discomfort- currently taking Flexeril due to this discomfort.  She does not desire another pregnancy and after much discussion wishes to consider permanent options to help regulate her period.  Prior work up including [place] and MRI.   Hgb 12.5 EMB completed (04/2023)- benign endometirum with polyp  No LMP recorded.  Csection x 1- vertical incision  Review of Systems:   Pertinent items are noted in HPI Denies fever/chills, visual disturbances, fatigue, shortness of breath, chest pain.  Overall does not feel well and sick of bleeding. Pertinent History Reviewed:   Past Surgical History:  Procedure Laterality Date   CESAREAN SECTION  09/18/2001   done in [place]; vertical ext scar; unknown int. scar   COLONOSCOPY     COLPOSCOPY     UPPER GASTROINTESTINAL ENDOSCOPY      Past Medical History:  Diagnosis Date   Abnormal Pap smear    Headache(784.0)    Helicobacter pylori gastritis 12/12/2022   History of worms    3 yrs ago   Hx of adenomatous polyp of colon 12/12/2022   March 2024 diminutive adenoma plus inflammatory polyp  recall 7 years 2031   Lymphocytic colitis 05/23/2019   Salmonella 2006   Reviewed problem list, medications and allergies. Physical Assessment:   Vitals:   05/25/23 0944  BP: 121/81  Pulse: 88  Weight: 185 lb 3.2 oz (84 kg)  Height: 4\' 11"  (1.499 m)  Body mass index is 37.41 kg/m.       Physical Examination:   General appearance: alert, well appearing, and in no distress  Psych: mood appropriate, normal affect  Skin: warm & dry   Cardiovascular: normal heart rate noted  Respiratory: normal respiratory effort, no distress  Abdomen: soft, non-tender, no rebound, no guarding, prior midline incision noted  Pelvic: examination not indicated- previously completed no discrete abnormalities noted  Extremities: no edema   Chaperone: N/A    Imaging: 01/2023: 10.2 x 5.7 x 6.6 cm = volume: 194.0 mL. A 2.1 x 1.9 x 1.9 cm rounded mass is noted in the right upper posterior uterine body.  Normal ovaries bilaterally.  MRI 02/2023:  Measures 9.9 x 5.3 by 5.7 cm (volume = 160 cm^3). Prior C-section scar noted. Several tiny intramural fibroids are seen measuring up to 1.6 cm. Two intracavitary fibroids are seen which measure 2.6 cm and 1.2 cm in diameter. Cervix and vagina are unremarkable in appearance.  Normal ovaries bilaterally  Assessment & Plan:  1) AUB, Uterine fibroids, Pelvic pain -pt has failed medical management and based on history would recommend proceeding with surgical interventions -discussed alternative  options ranging from IUD, HSC/D&C, ablation.  While ablation is not contraindicated for prior C-section due to her midline vertical skin- uterine incision unknown- would advise pt to consider alternative options -Discussed plan for robotic- assisted laparoscopic hysterectomy and bilateral salpingectomy  -Explained that this surgery is performed to remove the uterus through several small incisions in the abdomen- pending anatomy 3-5 ports. I discussed the risks and benefits of the  surgery, including, but not limited to risk of bleeding, including the need for blood transfusion, infection, damage to surrounding organs and tissues such as damage to bladder, ureter or bowel that would requiring additional procedures.  Discussed possible need for conversion to an open procedure and potential for other complications that cannot be predicted or prevented including DVT, PE or death. -reviewed same day procedure -typical recovery 6-8wks with several weeks off work and 8wks of pelvic rest -questions/concerns were addressed and pt desires to proceed -referral created for surgery   Orders Placed This Encounter  Procedures   Urine Culture   Urinalysis, Routine w reflex microscopic    Return for TBD.   Myna Hidalgo, DO Attending Obstetrician & Gynecologist, [facility] for [employer], [facility]

## 2023-05-26 LAB — MICROSCOPIC EXAMINATION
Bacteria, UA: NONE SEEN
Casts: NONE SEEN /LPF
WBC, UA: NONE SEEN /HPF (ref 0–5)

## 2023-05-26 LAB — URINALYSIS, ROUTINE W REFLEX MICROSCOPIC
Bilirubin, UA: NEGATIVE
Glucose, UA: NEGATIVE
Ketones, UA: NEGATIVE
Leukocytes,UA: NEGATIVE
Nitrite, UA: NEGATIVE
Specific Gravity, UA: 1.024 (ref 1.005–1.030)
Urobilinogen, Ur: 0.2 mg/dL (ref 0.2–1.0)
pH, UA: 5.5 (ref 5.0–7.5)

## 2023-05-27 LAB — URINE CULTURE

## 2023-05-28 ENCOUNTER — Encounter: Payer: Self-pay | Admitting: Obstetrics & Gynecology

## 2023-07-05 NOTE — Patient Instructions (Signed)
Instrucciones Para Antes de la Ciruga   Su ciruga est programada para-(your procedure is scheduled on)                                            07/10/2023 at 0600 AM.       Alison Mendoza - (Enter)    Por favor llame al [phone] si tiene algn problema en la maana de la ciruga. (Please call if you have any problems the morning of surgery.)                  Recuerde: (Remember)   No coma alimentos ni tome lquidos, incluyendo agua, despus de la medianoche del 12 midnight 07/09/2023.   (Do not eat food or drink liquids including water after midnight on   [employer] en la maana de la ciruga con un SORBITO de agua                                                 None     (Take these meds the morning of surgery with a SIP of water)   Puede cepillarse los dientes en la maana de la ciruga. (You may brush your teeth the morning of surgery)         DO NOT smoke or vape for 24 hours before your procedure.   No use joyas, maquillaje para los ojos, lpiz labial, locin corporal ni esmalte de uas oscuro, incluido el esmalte en gel, uas artificiales ni ningn otro tipo de [employer] uas naturales (dedos de manos y pies).  (Do not wear jewelry, eye makeup, lipstick, body lotion, or dark fingernail polish, including gel polish, artificial nails, or any other type of covering on natural nails (fingers and toes).   No puede usar desodorante. (You may wear deodorant)   Si va a ser ingresado despues de la ciruga, deje la maleta en el carro hasta que se le haya asignado una habitacin. (If you are to be admitted after surgery, leave suitcase in car until your room has been assigned.)   A los pacientes que se les d de alta el mismo da no se les permitir manejar a casa.  (Patients discharged on the day of surgery will not be allowed to drive home)   Use ropa suelta y cmoda de regreso a casa. (Wear  loose comfortable clothes for ride home)     Histerectoma total laparoscpica, cuidados posteriores Total Laparoscopic Hysterectomy, Care After La siguiente informacin ofrece orientacin sobre cmo cuidarse despus del procedimiento. El mdico tambin podr darle instrucciones ms especficas. Comunquese con el mdico si tiene problemas o preguntas. Qu puedo esperar despus del procedimiento? Despus del procedimiento, es normal tener los siguientes sntomas: Dolor, moretones y adormecimiento alrededor de las incisiones. Cansancio (fatiga). Falta de apetito. Menos inters por el sexo. Sangrado o secrecin vaginal. Deber usar una compresa higinica luego de este procedimiento. Sensacin de tristeza u otras emociones. Si se extirparon los ovarios  tambin, es comn tener sntomas de menopausia como acaloramiento, sudor nocturno y falta de sueo (insomnio). Siga estas instrucciones en su casa: Medicamentos Use los medicamentos de venta libre y los recetados solamente como se lo haya indicado el mdico. Pregntele al mdico si el medicamento recetado: Hace necesario que evite conducir o usar [place]. Puede causarle estreimiento. Es posible que tenga que tomar estas medidas para prevenir o tratar el estreimiento: Product manager suficiente lquido como para Pharmacologist la orina de color amarillo plido. Usar medicamentos recetados o de Sales promotion account executive. Consumir alimentos ricos en fibra, como frijoles, cereales integrales, y frutas y verduras frescas. Limitar el consumo de alimentos ricos en grasa y azcares procesados, como los alimentos fritos o dulces. Cuidado de la incisin  Siga las instrucciones del mdico acerca del cuidado de la incisin. Asegrese de hacer lo siguiente: Lvese las manos con agua y jabn durante al menos 20 segundos antes y despus de cambiarse la venda (vendaje). Use desinfectante para manos si no dispone de [place] y [place]. Cambie el vendaje como se lo haya indicado el  mdico. No retire los puntos (suturas), la goma para cerrar la piel o las tiras [place]. Es posible que estos cierres cutneos deban quedar puestos en la piel durante 2 semanas o ms tiempo. Si los bordes de las tiras [address] empiezan a despegarse y Scientific laboratory technician, puede recortar los que estn sueltos. No retire las tiras [employer] por completo a menos que el mdico se lo indique. Haematologist de la incisin todos los [address]  [address] para detectar signos de infeccin. Est atenta a los siguientes signos: Aumento del enrojecimiento, la hinchazn o Chief Technology Officer. Lquido o sangre. Calor. Pus o mal olor. Actividad  Haga reposo como se lo haya indicado el mdico. Evite estar sentada durante largos perodos sin moverse. Levntese y camine un poco cada 1 a 2 horas. Esto es importante para mejorar el flujo sanguneo y la respiracin. Pida ayuda si se siente dbil o inestable. Retome sus actividades normales segn lo indicado por el mdico. Pregntele al mdico qu actividades son seguras para usted. No levante ningn objeto que pese ms de 10 libras (4.5 kg) o el lmite de [employer] hayan indicado, [place] un mes despus de la ciruga o hasta que el mdico le diga que puede [place]. Si le administraron un sedante durante el procedimiento, puede afectarla por varias horas. No conduzca ni opere maquinaria hasta que el mdico le indique que es seguro [employer]. Estilo de vida No consuma ningn producto que contenga nicotina o tabaco. Estos productos incluyen cigarrillos, tabaco para Theatre manager y aparatos de vapeo, como los Administrator, Civil Service. Estos pueden retrasar la cicatrizacin despus de la ciruga. Si necesita ayuda para dejar de consumir estos productos, consulte al mdico. No beba alcohol hasta que el mdico lo autorice. Indicaciones generales  No se haga duchas vaginales, no utilice tampones ni tenga relaciones sexuales durante al menos 6 semanas o como se lo haya indicado el mdico. Si experimenta cambios  fsicos o emocionales despus del procedimiento, hable con su mdico o un terapeuta. No tome baos de inmersin, no nade ni use el jacuzzi hasta que el mdico lo autorice. Es posible que solo pueda tomar duchas durante 2 a 3 semanas. Mantenga seco el vendaje hasta que el mdico le diga que se lo puede quitar. Procure que alguien se quede en su casa durante 1 a 2 semanas despus de la ciruga para ayudarla con las tareas diarias. Use medias de compresin como se lo haya indicado su mdico.  Estas medias ayudan a Transport planner formacin de cogulos de [place] y a reducir la hinchazn de las piernas. Cumpla con todas las visitas de seguimiento. Esto es importante. Comunquese con un mdico si: Tiene cualquiera de estos signos de infeccin: Escalofros o fiebre. Aumento del enrojecimiento, la hinchazn o el dolor alrededor de la incisin. Sangre o lquido provenientes de la incisin. Calor proveniente de una incisin. Pus o mal olor provenientes de la incisin. Una incisin se abre. Se siente mareada o siente que va a desvanecerse. Tiene dolor o sangrado al orinar o no Education officer, environmental. Tiene una secrecin vaginal anormal. Su dolor no se alivia con medicamentos. Solicite ayuda de inmediato si: Lance Muss, y los sntomas empeoran repentinamente. Siente un dolor abdominal intenso. Siente falta de aire o Journalist, newspaper. Se desmaya. Siente dolor o tiene hinchazn o enrojecimiento en la pierna. Tiene sangrado vaginal abundante con cogulos de sangre que empapa una compresa higinica en menos de 1 hora. Estos sntomas pueden representar un problema grave que constituye Radio broadcast assistant. No espere a ver si los sntomas desaparecen. Solicite atencin mdica de inmediato. Comunquese con el servicio de emergencias de su localidad (911 en los [place]). No conduzca por sus propios medios [employer] [facility]. Resumen Despus del procedimiento, es habitual sentir dolor y Warehouse manager moretones alrededor de las  incisiones. No tome baos de inmersin, no nade ni use el jacuzzi hasta que el mdico lo autorice. No levante ningn objeto que pese ms de 10 libras (4.5 kg) o el lmite de [employer] hayan indicado, [place] un mes despus de la ciruga o hasta que el mdico le diga que puede [place]. Informe al mdico si tiene algn signo o sntoma de infeccin despus del procedimiento. Obtenga ayuda de inmediato si tiene dolor abdominal intenso, dolor en el pecho, dificultad para respirar o sangrando abundante de la vagina. Esta informacin no tiene Theme park manager el consejo del mdico. Asegrese de hacerle al mdico cualquier pregunta que tenga. Document Revised: 07/07/2020 Document Reviewed: 05/25/2020 Elsevier Patient Education  2024 Elsevier Inc. Anestesia general en adultos, cuidados posteriores General Anesthesia, Adult, Care After La siguiente informacin ofrece orientacin sobre cmo cuidarse despus del procedimiento. El mdico tambin podr darle instrucciones ms especficas. Comunquese con el mdico si tiene problemas o preguntas. Qu puedo esperar despus del procedimiento? Despus del procedimiento, es normal sentir o tener lo siguiente: Dolor o Social worker de la va intravenosa (i.v.). Nuseas o vmitos. Dolor de Advertising copywriter o ronquera. Dificultad para concentrarse. Fro o escalofros. Debilidad, somnolencia o cansancio (fatiga). Malestar y Tourist information centre manager. Estos sntomas pueden afectar partes del cuerpo que no estuvieron involucradas en la ciruga. Siga estas indicaciones en su casa: Durante el perodo de [employer] le haya indicado el mdico:  Descanse. No participe en actividades que impliquen posibles cadas o lesiones. No conduzca ni opere maquinaria. No beba alcohol. No tome somnferos ni medicamentos que causen somnolencia. No tome decisiones trascendentes ni firme documentos importantes. No cuide a nios por su cuenta. Indicaciones generales [place] suficiente  lquido como para mantener la orina de color amarillo plido. Si tiene apnea del sueo, la [place] y ciertos medicamentos pueden elevar su riesgo de tener problemas respiratorios. Siga las instrucciones del mdico respecto al uso del dispositivo para dormir: Siempre que duerma, incluso durante las siestas que tome en [employer]. Mientras tome analgsicos recetados, medicamentos para dormir o medicamentos que producen somnolencia. Retome sus actividades normales segn lo indicado por el mdico. Pregntele al mdico qu  actividades son seguras para usted. Use los medicamentos de venta libre y los recetados solamente como se lo haya indicado el mdico. No consuma ningn producto que contenga nicotina o tabaco. Estos productos incluyen cigarrillos, tabaco para Theatre manager y aparatos de vapeo, como los Administrator, Civil Service. Estos pueden retrasar la cicatrizacin de la incisin despus de la ciruga. Si necesita ayuda para dejar de consumir esos productos, consulte al mdico. Comunquese con un mdico si: Tiene nuseas o vmitos que no mejoran con medicamentos. Vomita cada vez que come o bebe. Su dolor no se alivia con medicamentos. No puede orinar o ve [employer]. Tiene una erupcin cutnea. Tiene fiebre. Solicite ayuda de inmediato si: Tiene dificultad para respirar. Siente dolor en el pecho. Vomita sangre. Estos sntomas pueden Customer service manager. Solicite ayuda de inmediato. Llame al 911. No espere a ver si los sntomas desaparecen. No conduzca por sus propios medios [employer]. Resumen Despus del procedimiento, es comn tener dolor de garganta, ronquera, nuseas, vmitos o sentir debilidad, somnolencia o fatiga. Durante el perodo de tiempo que le haya indicado el mdico, no conduzca ni use [place]. Busque ayuda de inmediato si le cuesta respirar, siente dolor en el pecho o vomita sangre. Estos sntomas pueden Customer service manager. Esta informacin no tiene Microbiologist el consejo del mdico. Asegrese de hacerle al mdico cualquier pregunta que tenga. Document Revised: 12/29/2021 Document Reviewed: 12/29/2021 Elsevier Patient Education  2024 [employer].

## 2023-07-06 ENCOUNTER — Encounter (HOSPITAL_COMMUNITY): Payer: Self-pay

## 2023-07-06 ENCOUNTER — Encounter (HOSPITAL_COMMUNITY)
Admission: RE | Admit: 2023-07-06 | Discharge: 2023-07-06 | Disposition: A | Discharge status: Home / Self Care | Payer: Self-pay | Source: Ambulatory Visit | Attending: Obstetrics & Gynecology | Admitting: Obstetrics & Gynecology

## 2023-07-06 DIAGNOSIS — N939 Abnormal uterine and vaginal bleeding, unspecified: Secondary | ICD-10-CM | POA: Insufficient documentation

## 2023-07-06 DIAGNOSIS — Z01812 Encounter for preprocedural laboratory examination: Secondary | ICD-10-CM | POA: Insufficient documentation

## 2023-07-06 HISTORY — DX: Other specified postprocedural states: R11.2

## 2023-07-06 HISTORY — DX: Other specified postprocedural states: Z98.890

## 2023-07-06 HISTORY — DX: Depression, unspecified: F32.A

## 2023-07-06 LAB — COMPREHENSIVE METABOLIC PANEL
ALT: 21 U/L (ref 0–44)
AST: 19 U/L (ref 15–41)
Albumin: 3.9 g/dL (ref 3.5–5.0)
Alkaline Phosphatase: 92 U/L (ref 38–126)
Anion gap: 9 (ref 5–15)
BUN: 10 mg/dL (ref 6–20)
CO2: 20 mmol/L — ABNORMAL LOW (ref 22–32)
Calcium: 8.5 mg/dL — ABNORMAL LOW (ref 8.9–10.3)
Chloride: 107 mmol/L (ref 98–111)
Creatinine, Ser: 0.73 mg/dL (ref 0.44–1.00)
GFR, Estimated: >60 mL/min (ref >60)
Glucose, Bld: 160 mg/dL — ABNORMAL HIGH (ref 70–99)
Potassium: 3.2 mmol/L — ABNORMAL LOW (ref 3.5–5.1)
Sodium: 136 mmol/L (ref 135–145)
Total Bilirubin: 0.6 mg/dL (ref 0.3–1.2)
Total Protein: 7.3 g/dL (ref 6.5–8.1)

## 2023-07-06 LAB — CBC
HCT: 37.9 % (ref 36.0–46.0)
Hemoglobin: 11.7 g/dL — ABNORMAL LOW (ref 12.0–15.0)
MCH: 25.2 pg — ABNORMAL LOW (ref 26.0–34.0)
MCHC: 30.9 g/dL (ref 30.0–36.0)
MCV: 81.7 fL (ref 80.0–100.0)
Platelets: 237 10*3/uL (ref 150–400)
RBC: 4.64 MIL/uL (ref 3.87–5.11)
RDW: 14.6 % (ref 11.5–15.5)
WBC: 8.4 10*3/uL (ref 4.0–10.5)
nRBC: 0 % (ref 0.0–0.2)

## 2023-07-06 LAB — PREGNANCY, URINE: Preg Test, Ur: NEGATIVE

## 2023-07-06 LAB — TYPE AND SCREEN
ABO/RH(D): O POS
Antibody Screen: NEGATIVE

## 2023-07-06 NOTE — Plan of Care (Signed)
[facility] Tonsillectomy/Adenoidectomy, Postoperative PEDS care plan entered in error.

## 2023-07-08 NOTE — H&P (Signed)
[facility]ne[facility] Attending History and Physical  Alison Mendoza is a 42 y.o. [phone] who presents for scheduled robotic assisted hysterectomy, bilateral salpingectomy, possible cystoscopy due to AUB, pelvic pain and uterine fibroids  In review, this has been an ongoing issue for over 6 mos- menses last for over 7 days and are sometimes every 3 weeks.  She has failed medical management with Depot and Megace.  She is still currently bleeding and on heavy days may use about to 4-5 pads.  Work up including imaging was completed which showed several small fibroids (see below).  EMB: benign findings  Denies any abnormal vaginal discharge, fevers, chills, sweats, dysuria, nausea, vomiting, other GI or GU symptoms or other general symptoms.  Spanish interpreter present  Past Medical History:  Diagnosis Date   Abnormal Pap smear    Depression    Headache(784.0)    Helicobacter pylori gastritis 12/12/2022   History of worms    3 yrs ago   Hx of adenomatous polyp of colon 12/12/2022   March 2024 diminutive adenoma plus inflammatory polyp recall 7 years 2031   Lymphocytic colitis 05/23/2019   PONV (postoperative nausea and vomiting)    Salmonella 2006   Past Surgical History:  Procedure Laterality Date   CESAREAN SECTION  09/18/2001   done in [place]; vertical ext scar; unknown int. scar   COLONOSCOPY     COLPOSCOPY     UPPER GASTROINTESTINAL ENDOSCOPY     OB History  Gravida Para Term Preterm AB Living  2 2 2  0 0 2  SAB IAB Ectopic Multiple Live Births  0 0 0 0 2    # Outcome Date GA Lbr Len/2nd Weight Sex Type Anes PTL Lv  2 Term 12/09/13 [redacted]w[redacted]d / 00:19 3635 g F VBAC EPI  LIV  1 Term 10/30/01 [redacted]w[redacted]d  3175 g F CS-Unspec Spinal  LIV     Birth Comments: has vertical skin incision, unknown uterine incision  Patient denies any other pertinent gynecologic issues.  No current facility-administered medications on file prior to encounter.   Current Outpatient  Medications on File Prior to Encounter  Medication Sig Dispense Refill   megestrol (MEGACE) 40 MG tablet Take 40 mg by mouth See admin instructions. Take 1 tablet (40 mg) by mouth scheduled once daily & may take additional doses every 6 hours if needed for pain/bleeding.     norethindrone (AYGESTIN) 5 MG tablet Take 2 tablets (10 mg total) by mouth daily. (Patient taking differently: Take 5 mg by mouth daily.) 60 tablet 3   No Known Allergies  Social History:   reports that she has never smoked. She has never used smokeless tobacco. She reports current alcohol use. She reports that she does not use drugs. Family History  Problem Relation Age of Onset   Lung disease Mother    Diabetes Sister    Diabetes Paternal Aunt    Colon cancer Neg Hx    Esophageal cancer Neg Hx    Rectal cancer Neg Hx    Stomach cancer Neg Hx    Breast cancer Neg Hx     Review of Systems: Pertinent items noted in HPI and remainder of comprehensive ROS otherwise negative.  PHYSICAL EXAM: Vitals to be obtained CONSTITUTIONAL: Well-developed, well-nourished female in no acute distress.  SKIN: Skin is warm and dry. No rash noted. Not diaphoretic. No erythema. No pallor. NEUROLOGIC: Alert and oriented to person, place, and time. Normal reflexes, muscle tone coordination. No cranial nerve deficit noted.  PSYCHIATRIC: Normal mood and affect. Normal behavior. Normal judgment and thought content. CARDIOVASCULAR: Normal heart rate noted, regular rhythm RESPIRATORY: Effort and breath sounds normal, no problems with respiration noted ABDOMEN: Soft, nontender, nondistended. Prior midline vertical incision noted. PELVIC: deferred MUSCULOSKELETAL: no calf tenderness bilaterally EXT: no edema bilaterally, normal pulses  Labs: Results for orders placed or performed during the hospital encounter of 07/06/23 (from the past 336 hour(s))  CBC   Collection Time: 07/06/23  8:36 AM  Result Value Ref Range   WBC 8.4 4.0 - 10.5  K/uL   RBC 4.64 3.87 - 5.11 MIL/uL   Hemoglobin 11.7 (L) 12.0 - 15.0 g/dL   HCT 16.1 09.6 - 04.5 %   MCV 81.7 80.0 - 100.0 fL   MCH 25.2 (L) 26.0 - 34.0 pg   MCHC 30.9 30.0 - 36.0 g/dL   RDW 40.9 81.1 - 91.4 %   Platelets 237 150 - 400 K/uL   nRBC 0.0 0.0 - 0.2 %  Comprehensive metabolic panel   Collection Time: 07/06/23  8:36 AM  Result Value Ref Range   Sodium 136 135 - 145 mmol/L   Potassium 3.2 (L) 3.5 - 5.1 mmol/L   Chloride 107 98 - 111 mmol/L   CO2 20 (L) 22 - 32 mmol/L   Glucose, Bld 160 (H) 70 - 99 mg/dL   BUN 10 6 - 20 mg/dL   Creatinine, Ser 7.82 0.44 - 1.00 mg/dL   Calcium 8.5 (L) 8.9 - 10.3 mg/dL   Total Protein 7.3 6.5 - 8.1 g/dL   Albumin 3.9 3.5 - 5.0 g/dL   AST 19 15 - 41 U/L   ALT 21 0 - 44 U/L   Alkaline Phosphatase 92 38 - 126 U/L   Total Bilirubin 0.6 0.3 - 1.2 mg/dL   GFR, Estimated >95 >62 mL/min   Anion gap 9 5 - 15  Pregnancy, urine   Collection Time: 07/06/23  8:36 AM  Result Value Ref Range   Preg Test, Ur NEGATIVE NEGATIVE  Type and screen   Collection Time: 07/06/23  8:36 AM  Result Value Ref Range   ABO/RH(D) O POS    Antibody Screen NEG    Sample Expiration 07/20/2023,2359    Extend sample reason      NO TRANSFUSIONS OR PREGNANCY IN THE PAST 3 MONTHS Performed at [facility], [address] [place], [place] [address]     Imaging Studies:  01/2023: 10.2 x 5.7 x 6.6 cm = volume: 194.0 mL. A 2.1 x 1.9 x 1.9 cm rounded mass is noted in the right upper posterior uterine body.  Normal ovaries bilaterally.   MRI 02/2023:  Measures 9.9 x 5.3 by 5.7 cm (volume = 160 cm^3). Prior C-section scar noted. Several tiny intramural fibroids are seen measuring up to 1.6 cm. Two intracavitary fibroids are seen which measure 2.6 cm and 1.2 cm in diameter. Cervix and vagina are unremarkable in appearance.  Normal ovaries bilaterally  Assessment: Abnormal uterine bleeding Uterine fibroids Pelvic pain   Plan: Robotic assisted laparoscopic  hysterectomy and bilateral salpingectomy, possible cystoscopy -Ancef 2g IV -IV Toradol -NPO -LR @ 125cc/hr -SCDs to OR -Risk/benefits and alternatives reviewed with the patient including but not limited to risk of bleeding, infection and injury to surrounding organs.  Discussed potential for further surgical intervention.  Initial consent with full discussion of risk/benefits completed in office on 9/6.  Questions and concerns were addressed today and pt desires to proceed  Myna Hidalgo, DO Attending Obstetrician &  Gynecologist, Biochemist, clinical for [employer], [facility]

## 2023-07-09 NOTE — Plan of Care (Signed)
[facility] Tonsillectomy/Adenoidectomy, Postoperative PEDS care plan entered in error.

## 2023-07-09 NOTE — Anesthesia Preprocedure Evaluation (Signed)
Anesthesia Evaluation  Patient identified by MRN, date of birth, ID band Patient awake    Reviewed: Allergy & Precautions, H&P , NPO status , Patient's Chart, lab work & pertinent test results, reviewed documented beta blocker date and time   History of Anesthesia Complications (+) PONV and history of anesthetic complications  Airway Mallampati: III  TM Distance: >3 FB Neck ROM: full    Dental no notable dental hx. (+) Teeth Intact, Dental Advisory Given   Pulmonary neg pulmonary ROS   Pulmonary exam normal breath sounds clear to auscultation       Cardiovascular negative cardio ROS Normal cardiovascular exam Rhythm:regular Rate:Normal     Neuro/Psych  Headaches PSYCHIATRIC DISORDERS  Depression       GI/Hepatic negative GI ROS, Neg liver ROS,,,  Endo/Other    Morbid obesity  Renal/GU negative Renal ROS  negative genitourinary   Musculoskeletal negative musculoskeletal ROS (+)    Abdominal  (+) + obese  Peds  Hematology negative hematology ROS (+) Thrombocytopenia - plt 114   Anesthesia Other Findings   Reproductive/Obstetrics (+) Pregnancy (h/o C/ Sx1 in [place] (unknown scar), attempting VBAC)                             Anesthesia Physical Anesthesia Plan  ASA: 2  Anesthesia Plan: General   Post-op Pain Management: Dilaudid IV   Induction: Intravenous  PONV Risk Score and Plan: Ondansetron, Dexamethasone, Midazolam and Scopolamine patch - Pre-op  Airway Management Planned: Oral ETT  Additional Equipment: None  Intra-op Plan:   Post-operative Plan: Extubation in OR  Informed Consent: I have reviewed the patients History and Physical, chart, labs and discussed the procedure including the risks, benefits and alternatives for the proposed anesthesia with the patient or authorized representative who has indicated his/her understanding and acceptance.     Dental advisory  given  Plan Discussed with: CRNA  Anesthesia Plan Comments:         Anesthesia Quick Evaluation

## 2023-07-10 ENCOUNTER — Ambulatory Visit (HOSPITAL_COMMUNITY): Payer: Self-pay | Admitting: Certified Registered Nurse Anesthetist

## 2023-07-10 ENCOUNTER — Encounter (HOSPITAL_COMMUNITY): Payer: Self-pay | Admitting: Obstetrics & Gynecology

## 2023-07-10 ENCOUNTER — Encounter (HOSPITAL_COMMUNITY)
Admission: RE | Disposition: A | Discharge status: Home / Self Care | Payer: Self-pay | Source: Home / Self Care | Attending: Obstetrics & Gynecology

## 2023-07-10 ENCOUNTER — Ambulatory Visit (HOSPITAL_COMMUNITY)
Admission: RE | Admit: 2023-07-10 | Discharge: 2023-07-10 | Disposition: A | Discharge status: Home / Self Care | Payer: Self-pay | Attending: Obstetrics & Gynecology | Admitting: Obstetrics & Gynecology

## 2023-07-10 ENCOUNTER — Ambulatory Visit (HOSPITAL_BASED_OUTPATIENT_CLINIC_OR_DEPARTMENT_OTHER): Payer: Self-pay | Admitting: Certified Registered Nurse Anesthetist

## 2023-07-10 DIAGNOSIS — N938 Other specified abnormal uterine and vaginal bleeding: Secondary | ICD-10-CM

## 2023-07-10 DIAGNOSIS — N888 Other specified noninflammatory disorders of cervix uteri: Secondary | ICD-10-CM | POA: Insufficient documentation

## 2023-07-10 DIAGNOSIS — N939 Abnormal uterine and vaginal bleeding, unspecified: Secondary | ICD-10-CM

## 2023-07-10 DIAGNOSIS — R102 Pelvic and perineal pain: Secondary | ICD-10-CM

## 2023-07-10 DIAGNOSIS — D251 Intramural leiomyoma of uterus: Secondary | ICD-10-CM

## 2023-07-10 DIAGNOSIS — D259 Leiomyoma of uterus, unspecified: Secondary | ICD-10-CM

## 2023-07-10 DIAGNOSIS — Z6837 Body mass index (BMI) 37.0-37.9, adult: Secondary | ICD-10-CM | POA: Insufficient documentation

## 2023-07-10 DIAGNOSIS — Z98891 History of uterine scar from previous surgery: Secondary | ICD-10-CM | POA: Insufficient documentation

## 2023-07-10 HISTORY — PX: ROBOTIC ASSISTED LAPAROSCOPIC HYSTERECTOMY AND SALPINGECTOMY: SHX6379

## 2023-07-10 LAB — POCT I-STAT, CHEM 8
BUN: 11 mg/dL (ref 6–20)
Calcium, Ion: 1.11 mmol/L — ABNORMAL LOW (ref 1.15–1.40)
Chloride: 111 mmol/L (ref 98–111)
Creatinine, Ser: 0.6 mg/dL (ref 0.44–1.00)
Glucose, Bld: 111 mg/dL — ABNORMAL HIGH (ref 70–99)
HCT: 42 % (ref 36.0–46.0)
Hemoglobin: 14.3 g/dL (ref 12.0–15.0)
Potassium: 3.8 mmol/L (ref 3.5–5.1)
Sodium: 141 mmol/L (ref 135–145)
TCO2: 18 mmol/L — ABNORMAL LOW (ref 22–32)

## 2023-07-10 SURGERY — XI ROBOTIC ASSISTED LAPAROSCOPIC HYSTERECTOMY AND SALPINGECTOMY
Anesthesia: General | Site: Abdomen | Laterality: Bilateral

## 2023-07-10 MED ORDER — POVIDONE-IODINE 10 % EX SWAB: 2.0000 | Freq: Once | CUTANEOUS | Status: DC

## 2023-07-10 MED ORDER — DEXAMETHASONE SODIUM PHOSPHATE 10 MG/ML IJ SOLN
INTRAMUSCULAR | Status: DC | PRN
  Administered 2023-07-10: 10 mg via INTRAVENOUS

## 2023-07-10 MED ORDER — STERILE WATER FOR IRRIGATION IR SOLN
Status: DC | PRN
  Administered 2023-07-10: 500 mL

## 2023-07-10 MED ORDER — ORAL CARE MOUTH RINSE: 15.0000 mL | Freq: Once | OROMUCOSAL | Status: AC

## 2023-07-10 MED ORDER — SUCCINYLCHOLINE CHLORIDE 20 MG/ML IJ SOLN
INTRAMUSCULAR | Status: DC | PRN
  Administered 2023-07-10: 100 mg via INTRAVENOUS

## 2023-07-10 MED ORDER — KETOROLAC TROMETHAMINE 30 MG/ML IJ SOLN
30.0000 mg | INTRAMUSCULAR | Status: AC
  Administered 2023-07-10: 30 mg via INTRAVENOUS
  Filled 2023-07-10: qty 1

## 2023-07-10 MED ORDER — SODIUM CHLORIDE 0.9 % IR SOLN
Status: DC | PRN
  Administered 2023-07-10: 500 mL

## 2023-07-10 MED ORDER — PROPOFOL 10 MG/ML IV BOLUS
INTRAVENOUS | Status: AC
  Filled 2023-07-10: qty 20

## 2023-07-10 MED ORDER — PROPOFOL 10 MG/ML IV BOLUS
INTRAVENOUS | Status: DC | PRN
  Administered 2023-07-10: 120 mg via INTRAVENOUS

## 2023-07-10 MED ORDER — ROCURONIUM BROMIDE 100 MG/10ML IV SOLN
INTRAVENOUS | Status: DC | PRN
  Administered 2023-07-10: 95 mg via INTRAVENOUS
  Administered 2023-07-10: 5 mg via INTRAVENOUS

## 2023-07-10 MED ORDER — SUGAMMADEX SODIUM 200 MG/2ML IV SOLN
INTRAVENOUS | Status: DC | PRN
  Administered 2023-07-10: 200 mg via INTRAVENOUS

## 2023-07-10 MED ORDER — ONDANSETRON HCL 4 MG/2ML IJ SOLN
INTRAMUSCULAR | Status: AC
Start: 2023-07-10 — End: ?
  Filled 2023-07-10: qty 6

## 2023-07-10 MED ORDER — PROPOFOL 500 MG/50ML IV EMUL
INTRAVENOUS | Status: DC | PRN
  Administered 2023-07-10: 50 ug/kg/min via INTRAVENOUS

## 2023-07-10 MED ORDER — FENTANYL CITRATE (PF) 250 MCG/5ML IJ SOLN
INTRAMUSCULAR | Status: DC | PRN
  Administered 2023-07-10: 50 ug via INTRAVENOUS
  Administered 2023-07-10: 150 ug via INTRAVENOUS
  Administered 2023-07-10: 50 ug via INTRAVENOUS

## 2023-07-10 MED ORDER — BUPIVACAINE HCL 0.5 % IJ SOLN
INTRAMUSCULAR | Status: DC | PRN
  Administered 2023-07-10: 40 mL

## 2023-07-10 MED ORDER — ACETAMINOPHEN 500 MG PO TABS
1000.0000 mg | ORAL_TABLET | Freq: Once | ORAL | Status: AC
  Administered 2023-07-10: 1000 mg via ORAL
  Filled 2023-07-10: qty 2

## 2023-07-10 MED ORDER — ONDANSETRON HCL 4 MG/2ML IJ SOLN
INTRAMUSCULAR | Status: DC | PRN
  Administered 2023-07-10 (×2): 4 mg via INTRAVENOUS

## 2023-07-10 MED ORDER — GABAPENTIN 300 MG PO CAPS
300.0000 mg | ORAL_CAPSULE | Freq: Three times a day (TID) | ORAL | 0 refills | Status: DC
Start: 2023-07-10 — End: 2023-10-25

## 2023-07-10 MED ORDER — ACETAMINOPHEN 160 MG/5ML PO SOLN
960.0000 mg | Freq: Once | ORAL | Status: AC
  Filled 2023-07-10: qty 30

## 2023-07-10 MED ORDER — LACTATED RINGERS IV SOLN: INTRAVENOUS | Status: DC | PRN

## 2023-07-10 MED ORDER — ACETAMINOPHEN 325 MG PO TABS
650.0000 mg | ORAL_TABLET | Freq: Four times a day (QID) | ORAL | 0 refills | Status: AC | PRN
Start: 2023-07-10 — End: 2023-08-09

## 2023-07-10 MED ORDER — ACETAMINOPHEN 10 MG/ML IV SOLN
1000.0000 mg | Freq: Once | INTRAVENOUS | Status: AC
  Administered 2023-07-10: 1000 mg via INTRAVENOUS
  Filled 2023-07-10: qty 100

## 2023-07-10 MED ORDER — DIPHENHYDRAMINE HCL 50 MG/ML IJ SOLN
INTRAMUSCULAR | Status: DC | PRN
  Administered 2023-07-10: 12.5 mg via INTRAVENOUS

## 2023-07-10 MED ORDER — HEMOSTATIC AGENTS (NO CHARGE) OPTIME
TOPICAL | Status: DC | PRN
  Administered 2023-07-10: 1 via TOPICAL

## 2023-07-10 MED ORDER — OXYCODONE HCL 5 MG PO TABS: 5.0000 mg | ORAL_TABLET | Freq: Four times a day (QID) | ORAL | 0 refills | Status: AC | PRN

## 2023-07-10 MED ORDER — ONDANSETRON HCL 4 MG/2ML IJ SOLN: 4.0000 mg | Freq: Once | INTRAMUSCULAR | Status: DC | PRN

## 2023-07-10 MED ORDER — MIDAZOLAM HCL 5 MG/5ML IJ SOLN
INTRAMUSCULAR | Status: DC | PRN
  Administered 2023-07-10: 2 mg via INTRAVENOUS

## 2023-07-10 MED ORDER — PHENYLEPHRINE HCL-NACL 20-0.9 MG/250ML-% IV SOLN
INTRAVENOUS | Status: DC | PRN
  Administered 2023-07-10: 20 ug/min via INTRAVENOUS

## 2023-07-10 MED ORDER — BUPIVACAINE HCL (PF) 0.5 % IJ SOLN
INTRAMUSCULAR | Status: AC
  Filled 2023-07-10: qty 60

## 2023-07-10 MED ORDER — DEXAMETHASONE SODIUM PHOSPHATE 10 MG/ML IJ SOLN
INTRAMUSCULAR | Status: AC
  Filled 2023-07-10: qty 1

## 2023-07-10 MED ORDER — ONDANSETRON 4 MG PO TBDP: 4.0000 mg | ORAL_TABLET | Freq: Three times a day (TID) | ORAL | 0 refills | Status: DC | PRN

## 2023-07-10 MED ORDER — DOCUSATE SODIUM 100 MG PO CAPS: 100.0000 mg | ORAL_CAPSULE | Freq: Two times a day (BID) | ORAL | 0 refills | Status: AC

## 2023-07-10 MED ORDER — HYDROMORPHONE HCL 1 MG/ML IJ SOLN
0.2500 mg | INTRAMUSCULAR | Status: DC | PRN
  Administered 2023-07-10 (×3): 0.5 mg via INTRAVENOUS
  Filled 2023-07-10 (×3): qty 0.5

## 2023-07-10 MED ORDER — KETOROLAC TROMETHAMINE 10 MG PO TABS: 10.0000 mg | ORAL_TABLET | Freq: Three times a day (TID) | ORAL | 0 refills | Status: AC

## 2023-07-10 MED ORDER — CEFAZOLIN SODIUM-DEXTROSE 2-4 GM/100ML-% IV SOLN
2.0000 g | INTRAVENOUS | Status: AC
  Administered 2023-07-10: 2 g via INTRAVENOUS
  Filled 2023-07-10: qty 100

## 2023-07-10 MED ORDER — CHLORHEXIDINE GLUCONATE 0.12 % MT SOLN
15.0000 mL | Freq: Once | OROMUCOSAL | Status: AC
  Administered 2023-07-10: 15 mL via OROMUCOSAL
  Filled 2023-07-10: qty 15

## 2023-07-10 MED ORDER — FENTANYL CITRATE (PF) 250 MCG/5ML IJ SOLN
INTRAMUSCULAR | Status: AC
  Filled 2023-07-10: qty 5

## 2023-07-10 MED ORDER — PHENYLEPHRINE HCL (PRESSORS) 10 MG/ML IV SOLN
INTRAVENOUS | Status: DC | PRN
  Administered 2023-07-10: 240 ug via INTRAVENOUS

## 2023-07-10 MED ORDER — MIDAZOLAM HCL 2 MG/2ML IJ SOLN
INTRAMUSCULAR | Status: AC
  Filled 2023-07-10: qty 2

## 2023-07-10 MED ORDER — IBUPROFEN 600 MG PO TABS: 600.0000 mg | ORAL_TABLET | Freq: Four times a day (QID) | ORAL | 0 refills | Status: DC | PRN

## 2023-07-10 SURGICAL SUPPLY — 71 items
ADH SKN CLS APL DERMABOND .7 (GAUZE/BANDAGES/DRESSINGS) ×2
APL ESCP 73.6OZ SRGCL (TIP) ×2
APL PRP STRL LF DISP 70% ISPRP (MISCELLANEOUS) ×2
BAG DRN RND TRDRP ANRFLXCHMBR (UROLOGICAL SUPPLIES) ×2
BAG URINE DRAIN 2000ML AR STRL (UROLOGICAL SUPPLIES) ×2 IMPLANT
BLADE SURG SZ11 CARB STEEL (BLADE) ×2 IMPLANT
CATH FOLEY 3WAY 30CC 16FR (CATHETERS) ×2 IMPLANT
CAUTERY HOOK MNPLR 1.6 DVNC XI (INSTRUMENTS) ×2 IMPLANT
CHLORAPREP W/TINT 26 (MISCELLANEOUS) ×2 IMPLANT
COVER LIGHT HANDLE STERIS (MISCELLANEOUS) ×4 IMPLANT
COVER MAYO STAND XLG (MISCELLANEOUS) ×2 IMPLANT
COVER TIP SHEARS 8 DVNC (MISCELLANEOUS) IMPLANT
DERMABOND ADVANCED .7 DNX12 (GAUZE/BANDAGES/DRESSINGS) ×2 IMPLANT
DILATOR CANAL MILEX (MISCELLANEOUS) IMPLANT
DRAPE ARM DVNC X/XI (DISPOSABLE) ×8 IMPLANT
DRAPE COLUMN DVNC XI (DISPOSABLE) ×2 IMPLANT
DRAPE UTILITY W/TAPE 26X15 (DRAPES) ×4 IMPLANT
DRIVER NDL MEGA 8 DVNC XI (INSTRUMENTS) ×1 IMPLANT
DRIVER NDLE MEGA DVNC XI (INSTRUMENTS) ×2
ELECT REM PT RETURN 9FT ADLT (ELECTROSURGICAL) ×2
ELECTRODE REM PT RTRN 9FT ADLT (ELECTROSURGICAL) ×1 IMPLANT
FORCEPS BPLR FENES DVNC XI (FORCEP) ×2 IMPLANT
GAUZE 4X4 16PLY ~~LOC~~+RFID DBL (SPONGE) ×4 IMPLANT
GLOVE BIO SURGEON STRL SZ 6.5 (GLOVE) ×6 IMPLANT
GLOVE BIOGEL PI IND STRL 7.0 (GLOVE) ×14 IMPLANT
GOWN STRL REUS W/ TWL LRG LVL3 (GOWN DISPOSABLE) ×4 IMPLANT
GOWN STRL REUS W/TWL LRG LVL3 (GOWN DISPOSABLE) ×8 IMPLANT
GYRUS RUMI II 2.5CM BLUE (DISPOSABLE)
GYRUS RUMI II 3.5CM BLUE (DISPOSABLE)
GYRUS RUMI II 4.0CM BLUE (DISPOSABLE)
KIT PINK PAD W/HEAD ARE REST (MISCELLANEOUS) ×2
KIT PINK PAD W/HEAD ARM REST (MISCELLANEOUS) ×1 IMPLANT
KIT TURNOVER CYSTO (KITS) ×2 IMPLANT
MANIFOLD NEPTUNE II (INSTRUMENTS) ×2 IMPLANT
MANIPULATOR VCARE SML CRV RETR (MISCELLANEOUS) IMPLANT
NDL HYPO 25X1 1.5 SAFETY (NEEDLE) ×1 IMPLANT
NDL INSUFFLATION 14GA 120MM (NEEDLE) IMPLANT
NEEDLE HYPO 25X1 1.5 SAFETY (NEEDLE) ×2
NEEDLE INSUFFLATION 14GA 120MM (NEEDLE)
NS IRRIG 500ML POUR BTL (IV SOLUTION) ×2 IMPLANT
OBTURATOR OPTICAL STND 8 DVNC (TROCAR) ×2
OBTURATOR OPTICALSTD 8 DVNC (TROCAR) ×1 IMPLANT
PACK PERI GYN (CUSTOM PROCEDURE TRAY) ×2 IMPLANT
POSITIONER HEAD 8X9X4 ADT (SOFTGOODS) ×2 IMPLANT
POWDER SURGICEL 3.0 GRAM (HEMOSTASIS) ×1 IMPLANT
RUMI II 3.0CM BLUE KOH-EFFICIE (DISPOSABLE) ×1 IMPLANT
RUMI II GYRUS 2.5CM BLUE (DISPOSABLE) IMPLANT
RUMI II GYRUS 3.5CM BLUE (DISPOSABLE) IMPLANT
RUMI II GYRUS 4.0CM BLUE (DISPOSABLE) IMPLANT
SEAL UNIV 5-12 XI (MISCELLANEOUS) ×6 IMPLANT
SEALER VESSEL EXT DVNC XI (MISCELLANEOUS) ×2 IMPLANT
SET BASIN LINEN APH (SET/KITS/TRAYS/PACK) ×2 IMPLANT
SET TRI-LUMEN FLTR TB AIRSEAL (TUBING) ×2 IMPLANT
SET TUBE IRRIG SUCTION NO TIP (IRRIGATION / IRRIGATOR) ×1 IMPLANT
SOL ANTI FOG 6CC (MISCELLANEOUS) ×2 IMPLANT
SPONGE T-LAP 18X18 ~~LOC~~+RFID (SPONGE) ×2 IMPLANT
SUT DVC VLOC 180 0 12IN GS21 (SUTURE) ×2
SUT MNCRL AB 4-0 PS2 18 (SUTURE) ×3 IMPLANT
SUT VIC AB 0 CT1 27 (SUTURE) ×2
SUT VIC AB 0 CT1 27XBRD ANBCTR (SUTURE) ×2 IMPLANT
SUT VIC AB 0 CT1 36 (SUTURE) ×2 IMPLANT
SUTURE DVC VLC 180 0 12IN GS21 (SUTURE) ×1 IMPLANT
SYR 10ML LL (SYRINGE) ×2 IMPLANT
SYR 20ML LL LF (SYRINGE) ×2 IMPLANT
SYR 50ML LL SCALE MARK (SYRINGE) ×4 IMPLANT
SYR CONTROL 10ML LL (SYRINGE) ×4 IMPLANT
SYR TOOMEY 50ML (SYRINGE) ×2 IMPLANT
TIP ENDOSCOPIC SURGICEL (TIP) ×1 IMPLANT
TIP UTERINE 6.7X8CM BLUE DISP (MISCELLANEOUS) ×1 IMPLANT
TROCAR PORT AIRSEAL 8X120 (TROCAR) ×2 IMPLANT
WATER STERILE IRR 500ML POUR (IV SOLUTION) ×2 IMPLANT

## 2023-07-10 NOTE — Anesthesia Procedure Notes (Signed)
Procedure Name: Intubation Date/Time: 07/10/2023 7:48 AM  Performed by: Darryl Nestle, CRNAPre-anesthesia Checklist: Patient identified, Emergency Drugs available, Suction available and Patient being monitored Patient Re-evaluated:Patient Re-evaluated prior to induction Oxygen Delivery Method: Circle system utilized Preoxygenation: Pre-oxygenation with 100% oxygen Induction Type: IV induction and Cricoid Pressure applied Ventilation: Mask ventilation without difficulty Laryngoscope Size: Mac and 3 Grade View: Grade I Tube type: Oral Tube size: 7.0 mm Number of attempts: 1 Airway Equipment and Method: Stylet and Oral airway Placement Confirmation: ETT inserted through vocal cords under direct vision, positive ETCO2 and breath sounds checked- equal and bilateral Secured at: 22 cm Tube secured with: Tape Dental Injury: Teeth and Oropharynx as per pre-operative assessment

## 2023-07-10 NOTE — Transfer of Care (Signed)
Immediate Anesthesia Transfer of Care Note  Patient: Alison Mendoza  Procedure(s) Performed: XI ROBOTIC ASSISTED LAPAROSCOPIC HYSTERECTOMY AND BILATERAL SALPINGECTOMY (Bilateral: Abdomen)  Patient Location: PACU  Anesthesia Type:General  Level of Consciousness: drowsy  Airway & Oxygen Therapy: Patient Spontanous Breathing and Patient connected to face mask oxygen  Post-op Assessment: Report given to RN and Post -op Vital signs reviewed and stable  Post vital signs: Reviewed and stable  Last Vitals:  Vitals Value Taken Time  BP 120/82   Temp 98   Pulse 78 07/10/23 1042  Resp 18 07/10/23 1042  SpO2 100 % 07/10/23 1042  Vitals shown include unfiled device data.  Last Pain:  Vitals:   07/10/23 0729  TempSrc: Oral  PainSc: 2          Complications: No notable events documented.

## 2023-07-10 NOTE — Discharge Instructions (Signed)
Plan mdico para el dolor posoperatorio:  >Tome gabapentina 300 mg tres veces al [employer], segn lo prescrito, durante 4 das, trate de espaciarlas uniformemente  >Tome Toradol cada 8 horas durante los primeros 3 [place].  Una vez que se acabe el toradol, cambie a ibuprofeno 600 mg cada 6 horas segn sea necesario.  >Entre Toradol, tome Tylenol (sin receta) cada 6 a 8 horas.  Si el Tylenol no parece lo suficientemente fuerte.  Tome el tylenol junto con la oxicodona cada 6 horas. Si la oxicodona parece "demasiado fuerte", simplemente tome Tylenol.  >La oxicodona causar estreimiento; asegrese de tomar un ablandador de heces (Colace) dos veces al da mientras toma este analgsico y/o contine con este medicamento hasta que su rgimen intestinal vuelva a la normalidad.  Si es posible, intente tomar Toradol o Ibuprofeno con alimentos para Acupuncturist.  >Utilice una almohadilla trmica tanto como sea necesario  >Tambin le envi una receta de zofran (ondansetrn) para las nuseas, para tomar si es necesario durante los primeros [address]  [address].  >Se cuidadoso con tu alimentacin los [address], lquidos y comida blanda y no picante, las frutas son geniales  >Levntate y [place], sin levantar objetos ni esforzarte.   INSTRUCCIONES PARA EL HOGAR  Tenga en cuenta cualquier sangrado, dolor o hinchazn inusual o excesivo. Es normal sentir [employer] o somnolencia leves durante aproximadamente 24 horas despus de la [place].   Ducharse cuando est cmodo  Restricciones: No conducir durante 24 horas o mientras est tomando analgsicos.  Actividad: No levantar objetos pesados (> 10 libras), nada en la vagina (sin tampones, duchas vaginales ni relaciones sexuales) durante 8 semanas; sin baos de tina durante 8 semanas Se espera manchado vaginal, pero si el sangrado es abundante y punto, llame al [facility]. INSTRUCCIONES  Tenga en cuenta cualquier sangrado, dolor o hinchazn inusual o excesivo. Es  normal sentir [employer] o somnolencia leves durante aproximadamente 24 horas despus de la [place].   Ducharse cuando est cmodo  Restricciones: No conducir durante 24 horas o mientras est tomando analgsicos.  Actividad: No levantar objetos pesados (> 10 libras), nada en la vagina (sin tampones, duchas vaginales ni relaciones sexuales) durante 8 semanas; sin baos de tina durante 8 semanas Se espera manchado vaginal, pero si su sangrado es abundante, punto, llame al consultorio.   Incisin: las tiritas se caern cuando estn listas para hacerlo; Puede limpiar la incisin con agua y [place], pero no frote ni restriegue el sitio de la incisin.  Es posible que experimente peridicamente un ligero drenaje de sangre de la incisin.  Esto es normal.  Si experimenta una gran cantidad de drenaje o la incisin se abre, llame a su mdico, quien probablemente lo derivar al departamento de emergencias.  Dieta: Puede volver a su dieta habitual.  No coma comidas abundantes.  Consuma comidas pequeas y frecuentes a lo largo del Futures trader.  Contine bebiendo una buena cantidad de agua al menos 6-8 vasos de agua por da, la hidratacin es muy importante para el proceso de curacin.  Manejo del dolor: siga las Engineer, maintenance (IT). Tome siempre los analgsicos recetados con las comidas.  La oxicodona puede causar estreimiento; es posible que desee tomar un ablandador de heces mientras toma este medicamento.  Se le ha enviado una receta de colace para tomar dos veces al da si es necesario mientras toma oxicodona.  Asegrese de [employer] lquidos y aumentar el consumo de fibra para ayudar con el estreimiento.  Alcohol: evtelo durante 24 horas y mientras est tomando analgsicos.  Nuseas: Tome sorbos de ginger ale o refresco.  Tambin se le envi una receta de zofran para que la tome si es necesario.  Fiebre: llame al mdico si la temperatura supera los 101 grados.  Seguimiento: si an no tiene una cita  de seguimiento programada, llame a la oficina al [phone].  Si tiene fiebre (una temperatura superior a 100,4), dolor que no se alivia con analgsicos, dificultad para respirar, hinchazn de una sola pierna o cualquier otro sntoma que le preocupe, acuda al consultorio de inmediato.

## 2023-07-10 NOTE — Anesthesia Postprocedure Evaluation (Signed)
Anesthesia Post Note  Patient: [name]  Procedure(s) Performed: XI ROBOTIC ASSISTED LAPAROSCOPIC HYSTERECTOMY AND BILATERAL SALPINGECTOMY (Bilateral: Abdomen)  Patient location during evaluation: PACU Anesthesia Type: General Level of consciousness: awake and alert Pain management: pain level controlled Vital Signs Assessment: post-procedure vital signs reviewed and stable Respiratory status: spontaneous breathing, nonlabored ventilation, respiratory function stable and patient connected to nasal cannula oxygen Cardiovascular status: stable and blood pressure returned to baseline Postop Assessment: no apparent nausea or vomiting Anesthetic complications: no   There were no known notable events for this encounter.   Last Vitals:  Vitals:   07/10/23 1100 07/10/23 1115  BP: 130/75 128/65  Pulse: 88 69  Resp: 19 19  Temp:    SpO2: 100% 100%    Last Pain:  Vitals:   07/10/23 1115  TempSrc:   PainSc: Asleep                 Quianna Avery L Glennys Schorsch

## 2023-07-10 NOTE — Op Note (Signed)
Pre Op Dx:   1) AUB 2) uterine fibroids 3) pelvic pain Post Op Dx:   same  Procedure:   Robotic Assisted Total Laparoscopic Hysterectomy and Bilateral Salpingectomy   Surgeon:  Dr. Myna Hidalgo Anesthesia:  general   EBL:  30cc  IVF:  1100cc UOP:  100cc   Drains:  Foley catheter Specimen removed:  Uterus with cervix, bilateral fallopian tubes Findings:  8cm anteverted uterus with one visible fibroid, normal tubes and ovaries bilaterally.  No significant adhesions noted from prior surgery Complications: None  Description of procedure:  After informed consent the patient was taken to the operating room and placed in dorsal supine position where general endotracheal anesthesia was administered and found to be adequate.  She was placed in dorsal lithotomy position with her arms tucked.  She was prepped and draped in the usual sterile fashion.  A timeout was called and the procedure confirmed.  A RUMI uterine manipulator with the Koh cup and a Foley catheter were placed.   An incision was made in the supraumbilical area and the Veress needle was inserted into the abdominal cavity without difficulty. Saline drop test failed and due to prior surgery concern was for adhesions.  Instead, entry was completed at the left upper quadrant port.  Veress needle was inserted in to the abdominal cavity without difficulty.  Proper placement was confirmed using the saline drop test and opening pressure was 3 mmHg. A pneumoperitoneum was obtained. The laparoscopic trocar and the laparoscope were placed under direct visualization.  The umbilical port was then placed under direct visualization.  Three additional 7mm ports were placed on either side of the umbilicus and an 8mm port was placed in the left upper quadrant under direct visualization.  The patient was placed in Trendelenburg position and the [employer] robotic device was docked.  Next, attention was turned to the console where the hysterectomy was  performed.  The right fallopian tube was divided at the mesosalpinx and the uteroovarian anastamosis was divided and the right round ligament was divided. This process was repeated on the contralateral side.  The anterior leaflet of the broad ligament was divided to create a bladder flap. The uterine artery and vein were skeletonized and desiccated superior to the Koh cup.  This process was repeated on the contralateral side.  The hook was then used to create the colpotomy starting anteriorly.  The colpotomy was completed along the ridge of the Koh cup and the uterus was passed off the field. The vaginal occluder was placed in the vagina to maintain pneumoperitoneum.  The vaginal cuff was then closed with V-loc suture in a double layer fashion. Ureters with peristalsis were visualized bilaterally.  Irrigation was performed and hemostasis confirmed. Arista powder was placed on the vaginal cuff and adnexa bilaterally.  The Da Vinci robotic device was undocked.  Under direct visualization TAP block was completed under direct visualization using 10cc of 0.25% marcaine in each of four locations.  Airseal was deflated and trocars were removed.The skin was closed with 4-0 Vicryl in subcuticular fashion with skin glue placed atop each port site.    The patient was returned to dorsal supine position, awakened and extubated in the OR having appeared to tolerate the procedure well.  All sponge, needle, and instrument counts were correct x 2 at the end of the case.  Pt tolerated procedure well and was taken to recovery in stable condition.   Myna Hidalgo, DO Attending Obstetrician & Gynecologist, [facility], [employer] [facility]

## 2023-07-11 ENCOUNTER — Telehealth: Payer: Self-pay | Admitting: Obstetrics & Gynecology

## 2023-07-11 NOTE — Telephone Encounter (Signed)
Called patient to check in after surgery-Spanish interpreter used.  She notes that she is having a lot of pain and does not feel like the medicine is working.  Reviewed medications with her it does seem like she is taking the Toradol, oxycodone.  It sounds like she is not taking the gabapentin and has the Tylenol, but again I could not quite tell if she is taking it regularly.  Encouraged patient to follow pain instructions and also strongly encouraged patient to use heating pack.  She denies fever or chills.  She is tolerating a regular diet.  She has not yet had a bowel movement.  Reviewed over-the-counter options as well as encouraged the stool softener twice daily.  Will check back again tomorrow to see how she is doing  Myna Hidalgo, DO Attending Obstetrician & Gynecologist, [facility] [employer], [facility]

## 2023-07-12 ENCOUNTER — Encounter (HOSPITAL_COMMUNITY): Payer: Self-pay | Admitting: Obstetrics & Gynecology

## 2023-07-12 ENCOUNTER — Telehealth: Payer: Self-pay

## 2023-07-12 LAB — SURGICAL PATHOLOGY

## 2023-07-12 NOTE — Telephone Encounter (Addendum)
Received email by D. Early of [facility] after making a wellness call to [facility] client. She shared that she had been coughing some and there was bleeding with cough since her surgery. D. Early program manager had recommended client [facility] afternoon for guidance. I was asked to please follow up.  Reviewed notes, surgery was 07/10/23 laparoscopic hysterectomy and bilateral salpingectomy by Dr. Charlotta Newton OB/GYN  Called client with interpreter services ID # [id number]  She states she has been coughing occasionally since her surgery on 07/10/23 and that at times there is some blood tinged sputum the color was red, but has decreased now to lighter color and that is only occasional. She denies fever, chills or shortness of breath and this began after surgery but is occasional and some sore throat since surgery. Inquired if she had mentioned this to Dr. Charlotta Newton during her follow up call on 07/11/23 and she states she did not , she had forgotten due to her pain. She also states she did not call the [facility] today because she thought they closed at [facility], discussed they normally take calls until 4 pm M-Thursday, but suggested she call Dr. Lawana Chambers office as they did her surgery and discuss this with them Recommended she call directly after hanging up and if unable to reach to call again first thing Friday morning. She states understanding and reports she does have that phone number.  Discussed that if she develops increased coughing or bleeding increases with cough, to go to the emergency room, also if she develops any fever, shortness of breath or chills to go to the emergency room. She states understanding and will do so. Again reinforced calling her surgeon's office today and tomorrow first thing to discuss this concern Dr. Lawana Chambers office and to leave a message to call her back and reason for call, if she is asked to leave a message. She again confirms understanding.  Plan: will call tomorrow  to check in with [facility] Client : she is agreeable.  Francee Nodal RN [employer]

## 2023-07-13 ENCOUNTER — Telehealth: Payer: Self-pay

## 2023-07-13 NOTE — Telephone Encounter (Signed)
Called for follow up of [facility] client regarding her cough that she had occasionally mainly mornings since surgery 07/10/23 that has also at times she reports had some blood tinged sputum.  She states she coughed some this morning early but there was no blood.  She states she did call her surgeon's office (ob/gyn) as suggested yesterday and was told this is normal. She reports she is slowly feeling better.  I asked if I may call her next week to check in with her and she states that is agreeable.  She states she has no other needs today.  Call assisted by Interpreter services Spanish [phone]  Francee Nodal RN Clara Gunn/[facility]

## 2023-07-18 ENCOUNTER — Encounter: Payer: Self-pay | Admitting: Obstetrics & Gynecology

## 2023-07-18 ENCOUNTER — Ambulatory Visit (INDEPENDENT_AMBULATORY_CARE_PROVIDER_SITE_OTHER): Payer: Self-pay | Admitting: Obstetrics & Gynecology

## 2023-07-18 VITALS — BP 120/83 | HR 88 | Temp 98.5°F | Ht 59.0 in

## 2023-07-18 DIAGNOSIS — Z09 Encounter for follow-up examination after completed treatment for conditions other than malignant neoplasm: Secondary | ICD-10-CM

## 2023-07-18 DIAGNOSIS — G8918 Other acute postprocedural pain: Secondary | ICD-10-CM

## 2023-07-18 MED ORDER — OXYCODONE HCL 5 MG PO TABS: 5.0000 mg | ORAL_TABLET | Freq: Four times a day (QID) | ORAL | 0 refills | Status: AC | PRN

## 2023-07-18 NOTE — Progress Notes (Signed)
    PostOp Visit Note  Alison Mendoza is a 42 y.o. G54P2002 female who presents for a postoperative visit. She is 1 week postop following a RAH,BS completed on 10/22   Spanish interpreter present  Today she notes that she is doing ok- seems as though pain has been more than anticipated for her.  She has completed the Toradol and gabapentin- still taking tylenol and ibuprofen.  It seems as though she may have run out of the oxycodone. Denies fever or chills.  Tolerating gen diet.  +Flatus, She has had some BMs, but only little.  Notes feeling constipated, it does seem like she is taking the Colace.  Having some mild nausea, no vomiting.  Some difficulty with sleeping  Review of Systems Pertinent items are noted in HPI.    Objective:  BP 120/83 (BP Location: Right Arm, Patient Position: Sitting, Cuff Size: Normal)   Pulse 88   Temp 98.5 F (36.9 C)   Ht 4\' 11"  (1.499 m)   LMP  (LMP Unknown)   BMI 37.37 kg/m    Physical Examination:  GENERAL ASSESSMENT: No acute distress SKIN: warm and dry, not diaphoretic CHEST: CTAB HEART: regular rate and rhythm ABDOMEN: soft, non-distended, appropriately tender, BS noted INCISION: Clean, dry, intact and healing appropriately EXTREMITY: No edema, no calf tenderness PSYCH: mood appropriate, normal affect       Assessment:   Postop visit Postop pain  Plan:   -Reassured patient that constipation can be part of surgical recovery and due to medication.  Reviewed over-the-counter supplements that she should be taking regularly -Will refill oxycodone, encouraged patient to take sparingly -Seems to be meeting milestones appropriately however, based on her report it seems as though she is having more challenging time than she anticipated -Will continue to closely monitor, follow-up in 1 week  Myna Hidalgo, DO Attending Obstetrician & Gynecologist, [facility] [employer], [facility]

## 2023-07-27 ENCOUNTER — Encounter: Payer: Self-pay | Admitting: Obstetrics & Gynecology

## 2023-07-27 ENCOUNTER — Ambulatory Visit (INDEPENDENT_AMBULATORY_CARE_PROVIDER_SITE_OTHER): Payer: Self-pay | Admitting: Obstetrics & Gynecology

## 2023-07-27 VITALS — BP 117/80 | HR 86 | Ht 59.0 in | Wt 183.2 lb

## 2023-07-27 DIAGNOSIS — Z4889 Encounter for other specified surgical aftercare: Secondary | ICD-10-CM

## 2023-07-27 NOTE — Progress Notes (Signed)
    PostOp Visit Note  Alison Mendoza is a 42 y.o. G47P2002 female who presents for a postoperative visit. She is 2 weeks postop following a RAH, BS completed on 10/22   Interpreter present  Today she notes that she seems to be doing better than last week.  She notes that her pain has significantly improved.  She does note some mild nausea and dizziness.  She reports that she is ambulating without difficulty.  Tolerating gen diet.  +Flatus, Regular BMs.    She has noted some difficulty with sleeping and pain in her legs.  She is not currently taking anything to help with this issue  Review of Systems Pertinent items are noted in HPI.    Objective:  BP 117/80 (BP Location: Right Arm, Patient Position: Sitting, Cuff Size: Normal)   Pulse 86   Ht 4\' 11"  (1.499 m)   Wt 183 lb 3.2 oz (83.1 kg)   LMP  (LMP Unknown)   BMI 37.00 kg/m    Physical Examination:  GENERAL ASSESSMENT: well developed and well nourished SKIN: warm and dry CHEST: normal air exchange, respiratory effort normal with no retractions HEART: regular rate and rhythm ABDOMEN: soft, non-distended, +BS INCISION: healing appropriately- C/D/I EXTREMITY: no edema, no calf tenderness bilaterally PSYCH: mood appropriate, normal affect       Assessment:    Postop visit   Plan:   -Seems to be meeting milestones appropriately -Discussed over-the-counter sleep aids as well as magnesium lotion -Will plan to follow-up in 6 weeks -Reviewed pelvic rest and continued postop recovery  Myna Hidalgo, DO Attending Obstetrician & Gynecologist, [facility] [employer], [facility]

## 2023-09-07 ENCOUNTER — Ambulatory Visit: Payer: Self-pay | Admitting: Obstetrics & Gynecology

## 2023-09-07 ENCOUNTER — Encounter: Payer: Self-pay | Admitting: Obstetrics & Gynecology

## 2023-09-07 ENCOUNTER — Other Ambulatory Visit (HOSPITAL_COMMUNITY)
Admission: RE | Admit: 2023-09-07 | Discharge: 2023-09-07 | Disposition: A | Discharge status: Home / Self Care | Payer: Self-pay | Source: Ambulatory Visit | Attending: Obstetrics & Gynecology | Admitting: Obstetrics & Gynecology

## 2023-09-07 VITALS — BP 119/80 | HR 91 | Ht 59.0 in | Wt 186.4 lb

## 2023-09-07 DIAGNOSIS — R42 Dizziness and giddiness: Secondary | ICD-10-CM

## 2023-09-07 DIAGNOSIS — Z4889 Encounter for other specified surgical aftercare: Secondary | ICD-10-CM

## 2023-09-07 DIAGNOSIS — N898 Other specified noninflammatory disorders of vagina: Secondary | ICD-10-CM | POA: Insufficient documentation

## 2023-09-07 NOTE — Progress Notes (Signed)
    PostOp Visit Note  Alison Mendoza is a 42 y.o. G79P2002 female who presents for a postoperative visit. She is 8 weeks postop following a robotic assisted hysterectomy, bilateral salpingectomy completed on 10/22   Today she notes that she is still having episodes of dizziness.  She is only taking tylenol for pain.  Upon further questioning, she notes the dizziness has been present since prior to surgery- this is a long standing issue.  Denies fever or chills.  Tolerating gen diet.  +Flatus, Regular BMs.  She has noted some RLQ discomfort- intermittent. As mentioned only taking tylenol for pain.    She is also reporting vaginal itching both internally and externally. Denies vaginal discharge or bleeding.  No other acute complaints.  Spanish interpreter present Review of Systems Pertinent items are noted in HPI.    Objective:  BP 119/80 (BP Location: Right Arm, Patient Position: Sitting, Cuff Size: Large)   Pulse 91   Ht 4\' 11"  (1.499 m)   Wt 186 lb 6.4 oz (84.6 kg)   LMP  (LMP Unknown)   BMI 37.65 kg/m    Physical Examination:  GENERAL ASSESSMENT: well developed and well nourished SKIN: normal color, no lesions CHEST: normal air exchange, respiratory effort normal with no retractions HEART: regular rate and rhythm ABDOMEN: soft, non-distended, no rebound, no guarding, non-tender on exam INCISION: well healed- C/D/I GU: Normal external genitalia no irritation noted.  Pink moist vaginal mucosa -no discharge or abnormalities noted.  Vaginal cuff appears intact.  On bimanual exam cuff intact no abnormalities appreciated. EXTREMITY: no edema, no calf tenderness PSYCH: mood appropriate, normal affect       Assessment:    8wk postop visit Vaginal itching Dizziness Pelvic pain   Plan:   -No acute abnormalities noted on exam -Vaginitis panel collected to rule out underlying infection -Will rule out anemia as a cause of her dizziness.  Advised patient to follow-up with PCP  regarding further workup and management as this does not seem gynecologic or surgically related -Reviewed postop milestones and encouraged patient to slowly and steadily return to regular activity -Question and concerns were addressed  Myna Hidalgo, DO Attending Obstetrician & Gynecologist, [facility], [facility]

## 2023-09-08 LAB — CBC
Hematocrit: 39.9 % (ref 34.0–46.6)
Hemoglobin: 12.3 g/dL (ref 11.1–15.9)
MCH: 25.2 pg — ABNORMAL LOW (ref 26.6–33.0)
MCHC: 30.8 g/dL — ABNORMAL LOW (ref 31.5–35.7)
MCV: 82 fL (ref 79–97)
Platelets: 280 10*3/uL (ref 150–450)
RBC: 4.89 x10E6/uL (ref 3.77–5.28)
RDW: 16.1 % — ABNORMAL HIGH (ref 11.7–15.4)
WBC: 7.3 10*3/uL (ref 3.4–10.8)

## 2023-09-11 LAB — CERVICOVAGINAL ANCILLARY ONLY
Bacterial Vaginitis (gardnerella): NEGATIVE
Candida Glabrata: NEGATIVE
Candida Vaginitis: NEGATIVE
Comment: NEGATIVE
Comment: NEGATIVE
Comment: NEGATIVE

## 2023-09-19 ENCOUNTER — Emergency Department (HOSPITAL_COMMUNITY): Payer: Self-pay

## 2023-09-19 ENCOUNTER — Emergency Department (HOSPITAL_COMMUNITY)
Admission: EM | Admit: 2023-09-19 | Discharge: 2023-09-20 | Disposition: A | Discharge status: Home / Self Care | Payer: Self-pay | Attending: Emergency Medicine | Admitting: Emergency Medicine

## 2023-09-19 ENCOUNTER — Encounter (HOSPITAL_COMMUNITY): Payer: Self-pay

## 2023-09-19 ENCOUNTER — Other Ambulatory Visit: Payer: Self-pay

## 2023-09-19 DIAGNOSIS — K5792 Diverticulitis of intestine, part unspecified, without perforation or abscess without bleeding: Secondary | ICD-10-CM | POA: Insufficient documentation

## 2023-09-19 LAB — COMPREHENSIVE METABOLIC PANEL WITH GFR
ALT: 28 U/L (ref 0–44)
AST: 28 U/L (ref 15–41)
Albumin: 3.8 g/dL (ref 3.5–5.0)
Alkaline Phosphatase: 138 U/L — ABNORMAL HIGH (ref 38–126)
Anion gap: 7 (ref 5–15)
BUN: 8 mg/dL (ref 6–20)
CO2: 21 mmol/L — ABNORMAL LOW (ref 22–32)
Calcium: 8.7 mg/dL — ABNORMAL LOW (ref 8.9–10.3)
Chloride: 108 mmol/L (ref 98–111)
Creatinine, Ser: 0.59 mg/dL (ref 0.44–1.00)
GFR, Estimated: >60 mL/min
Glucose, Bld: 140 mg/dL — ABNORMAL HIGH (ref 70–99)
Potassium: 3.5 mmol/L (ref 3.5–5.1)
Sodium: 136 mmol/L (ref 135–145)
Total Bilirubin: 0.4 mg/dL (ref 0.0–1.2)
Total Protein: 7.7 g/dL (ref 6.5–8.1)

## 2023-09-19 LAB — CBC
HCT: 39.4 % (ref 36.0–46.0)
Hemoglobin: 12.5 g/dL (ref 12.0–15.0)
MCH: 25.8 pg — ABNORMAL LOW (ref 26.0–34.0)
MCHC: 31.7 g/dL (ref 30.0–36.0)
MCV: 81.4 fL (ref 80.0–100.0)
Platelets: 248 K/uL (ref 150–400)
RBC: 4.84 MIL/uL (ref 3.87–5.11)
RDW: 16.8 % — ABNORMAL HIGH (ref 11.5–15.5)
WBC: 8.7 K/uL (ref 4.0–10.5)
nRBC: 0 % (ref 0.0–0.2)

## 2023-09-19 LAB — TROPONIN I (HIGH SENSITIVITY): Troponin I (High Sensitivity): 2 ng/L (ref <18)

## 2023-09-19 LAB — LIPASE, BLOOD: Lipase: 34 U/L (ref 11–51)

## 2023-09-19 MED ORDER — IOHEXOL 300 MG/ML  SOLN
100.0000 mL | Freq: Once | INTRAMUSCULAR | Status: AC | PRN
  Administered 2023-09-20: 100 mL via INTRAVENOUS

## 2023-09-19 MED ORDER — FENTANYL CITRATE PF 50 MCG/ML IJ SOSY
50.0000 ug | PREFILLED_SYRINGE | Freq: Once | INTRAMUSCULAR | Status: AC
  Administered 2023-09-20: 50 ug via INTRAVENOUS
  Filled 2023-09-19: qty 1

## 2023-09-19 MED ORDER — ONDANSETRON HCL 4 MG/2ML IJ SOLN
4.0000 mg | Freq: Once | INTRAMUSCULAR | Status: AC
  Administered 2023-09-20: 4 mg via INTRAVENOUS
  Filled 2023-09-19: qty 2

## 2023-09-19 NOTE — ED Triage Notes (Signed)
 Pt with upper abd pain and vomiting this evening.

## 2023-09-19 NOTE — ED Provider Notes (Signed)
 [name] Provider Note   CSN: [id number] Arrival date & time: 09/19/23  2235     History  Chief Complaint  Patient presents with   Abdominal Pain   Level 5 caveat due to acuity of condition Alison Mendoza is a 43 y.o. female.  The history is provided by the patient. The history is limited by the condition of the patient and a language barrier.  Patient with history of depression presents with abdominal pain.  She reports upper abdominal pain and vomiting.  Patient with recent hysterectomy.  Patient is very uncomfortable appearing    Past Medical History:  Diagnosis Date   Abnormal Pap smear    Depression    Headache(784.0)    Helicobacter pylori gastritis 12/12/2022   History of worms    3 yrs ago   Hx of adenomatous polyp of colon 12/12/2022   March 2024 diminutive adenoma plus inflammatory polyp recall 7 years 2031   Lymphocytic colitis 05/23/2019   PONV (postoperative nausea and vomiting)    Salmonella 2006    Home Medications Prior to Admission medications   Medication Sig Start Date End Date Taking? Authorizing Provider  amoxicillin -clavulanate (AUGMENTIN ) 875-125 MG tablet Take 1 tablet by mouth every 12 (twelve) hours. 09/20/23  Yes Midge Golas, MD  gabapentin  (NEURONTIN ) 300 MG capsule Take 1 capsule (300 mg total) by mouth 3 (three) times daily for 4 days. 07/10/23 07/14/23  Ozan, Jennifer, DO  ibuprofen  (ADVIL ) 600 MG tablet Take 1 tablet (600 mg total) by mouth every 6 (six) hours as needed. 07/10/23   Ozan, Jennifer, DO  ondansetron  (ZOFRAN -ODT) 4 MG disintegrating tablet Take 1 tablet (4 mg total) by mouth every 8 (eight) hours as needed. 07/10/23   Ozan, Jennifer, DO      Allergies    Patient has no known allergies.    Review of Systems   Review of Systems  Unable to perform ROS: Acuity of condition    Physical Exam Updated Vital Signs BP 106/73   Pulse 78   Temp 99.1 F (37.3 C) (Oral)    Resp 16   Ht 1.499 m (4' 11)   Wt 83.9 kg   LMP  (LMP Unknown)   SpO2 97%   BMI 37.37 kg/m  Physical Exam CONSTITUTIONAL: Well developed/well nourished, uncomfortable appearing HEAD: Normocephalic/atraumatic EYES: EOMI/PERRL, no icterus ENMT: Mucous membranes moist NECK: supple no meningeal signs CV: S1/S2 noted, no murmurs/rubs/gallops noted LUNGS:  no apparent distress ABDOMEN: soft, diffuse moderate tenderness, no rebound or guarding, bowel sounds noted throughout abdomen NEURO: Pt is awake/alert/appropriate, moves all extremitiesx4.  No facial droop.   EXTREMITIES:  full ROM SKIN: warm, color normal PSYCH: Anxious  ED Results / Procedures / Treatments   Labs (all labs ordered are listed, but only abnormal results are displayed) Labs Reviewed  COMPREHENSIVE METABOLIC PANEL - Abnormal; Notable for the following components:      Result Value   CO2 21 (*)    Glucose, Bld 140 (*)    Calcium  8.7 (*)    Alkaline Phosphatase 138 (*)    All other components within normal limits  CBC - Abnormal; Notable for the following components:   MCH 25.8 (*)    RDW 16.8 (*)    All other components within normal limits  URINALYSIS, ROUTINE W REFLEX MICROSCOPIC - Abnormal; Notable for the following components:   Color, Urine STRAW (*)    All other components within normal limits  LIPASE, BLOOD  TROPONIN I (HIGH SENSITIVITY)    EKG EKG Interpretation Date/Time:  Thursday September 20 2023 00:12:30 EST Ventricular Rate:  85 PR Interval:  166 QRS Duration:  96 QT Interval:  378 QTC Calculation: 450 R Axis:   78  Text Interpretation: Sinus rhythm Borderline T abnormalities, anterior leads Confirmed by Midge Golas ([id number]) on 09/20/2023 12:21:05 AM  Radiology CT ABDOMEN PELVIS W CONTRAST Result Date: 09/20/2023 CLINICAL DATA:  Acute abdominal pain and vomiting EXAM: CT ABDOMEN AND PELVIS WITH CONTRAST TECHNIQUE: Multidetector CT imaging of the abdomen and pelvis was performed using  the standard protocol following bolus administration of intravenous contrast. RADIATION DOSE REDUCTION: This exam was performed according to the departmental dose-optimization program which includes automated exposure control, adjustment of the mA and/or kV according to patient size and/or use of iterative reconstruction technique. CONTRAST:  OMNIPAQUE  IOHEXOL  300 MG/ML  SOLN COMPARISON:  None Available. FINDINGS: Lower chest: No acute abnormality. Hepatobiliary: No focal liver abnormality is seen. No gallstones, gallbladder wall thickening, or biliary dilatation. Pancreas: Unremarkable. No pancreatic ductal dilatation or surrounding inflammatory changes. Spleen: Normal in size without focal abnormality. Adrenals/Urinary Tract: Adrenal glands are within normal limits. Kidneys show normal enhancement pattern bilaterally. No renal calculi or obstructive changes are seen. Bladder is decompressed. Stomach/Bowel: Scattered diverticular changes noted without evidence of diverticulitis. The appendix is within normal limits. No small bowel or gastric abnormality is noted. Vascular/Lymphatic: No significant vascular findings are present. No enlarged abdominal or pelvic lymph nodes. Reproductive: Status post hysterectomy. No adnexal masses. Other: No abdominal wall hernia or abnormality. No abdominopelvic ascites. Musculoskeletal: No acute or significant osseous findings. IMPRESSION: Diverticulosis without diverticulitis. Electronically Signed   By: Oneil Devonshire M.D.   On: 09/20/2023 00:56    Procedures Procedures    Medications Ordered in ED Medications  fentaNYL  (SUBLIMAZE ) injection 50 mcg (50 mcg Intravenous Given 09/20/23 0002)  ondansetron  (ZOFRAN ) injection 4 mg (4 mg Intravenous Given 09/20/23 0000)  iohexol  (OMNIPAQUE ) 300 MG/ML solution 100 mL (100 mLs Intravenous Contrast Given 09/20/23 0032)  amoxicillin -clavulanate (AUGMENTIN ) 875-125 MG per tablet 1 tablet (1 tablet Oral Given 09/20/23 0133)    ED  Course/ Medical Decision Making/ A&P Clinical Course as of 09/20/23 0221  Thu Sep 20, 2023  0219 Pt improved She is tolerating PO Utilized spanish interpreter daniel at [phone]  Will treat for diverticulitis given her pain She has had no vomiting/diarrhea No other acute findings Needs PCP f/u in one month, she may need colonoscopy and this was discussed [DW]    Clinical Course User Index [DW] Midge Golas, MD                                 Medical Decision Making Amount and/or Complexity of Data Reviewed Labs: ordered. Radiology: ordered. ECG/medicine tests: ordered.  Risk Prescription drug management.   This patient presents to the ED for concern of abdominal pain, this involves an extensive number of treatment options, and is a complaint that carries with it a high risk of complications and morbidity.  The differential diagnosis includes but is not limited to cholecystitis, cholelithiasis, pancreatitis, gastritis, peptic ulcer disease, appendicitis, bowel obstruction, bowel perforation, diverticulitis, AAA, ischemic bowel    Comorbidities that complicate the patient evaluation: Patient's presentation is complicated by their history of depression  Social Determinants of Health: Patient's  limited English proficiency   increases the complexity of managing their presentation  Additional history obtained: Records reviewed  recent OB notes reviewed  Lab Tests: I Ordered, and personally interpreted labs.  The pertinent results include:  hyperglycemia  Imaging Studies ordered: I ordered imaging studies including CT scan abdomen pelvis   I independently visualized and interpreted imaging which showed ?diverticulitis I agree with the radiologist interpretation  Cardiac Monitoring: The patient was maintained on a cardiac monitor.  I personally viewed and interpreted the cardiac monitor which showed an underlying rhythm of:  sinus rhythm  Medicines ordered and  prescription drug management: I ordered medication including fentanyl  for pain Reevaluation of the patient after these medicines showed that the patient    improved   Reevaluation: After the interventions noted above, I reevaluated the patient and found that they have :improved  Complexity of problems addressed: Patient's presentation is most consistent with  acute presentation with potential threat to life or bodily function  Disposition: After consideration of the diagnostic results and the patient's response to treatment,  I feel that the patent would benefit from discharge   .           Final Clinical Impression(s) / ED Diagnoses Final diagnoses:  Diverticulitis    Rx / DC Orders ED Discharge Orders          Ordered    amoxicillin -clavulanate (AUGMENTIN ) 875-125 MG tablet  Every 12 hours        09/20/23 0210              Midge Golas, MD 09/20/23 0221

## 2023-09-20 ENCOUNTER — Telehealth: Payer: Self-pay

## 2023-09-20 LAB — URINALYSIS, ROUTINE W REFLEX MICROSCOPIC
Bilirubin Urine: NEGATIVE
Glucose, UA: NEGATIVE mg/dL
Hgb urine dipstick: NEGATIVE
Ketones, ur: NEGATIVE mg/dL
Leukocytes,Ua: NEGATIVE
Nitrite: NEGATIVE
Protein, ur: NEGATIVE mg/dL
Specific Gravity, Urine: 1.006 (ref 1.005–1.030)
pH: 6 (ref 5.0–8.0)

## 2023-09-20 MED ORDER — AMOXICILLIN-POT CLAVULANATE 875-125 MG PO TABS: 1.0000 | ORAL_TABLET | Freq: Two times a day (BID) | ORAL | 0 refills | Status: DC

## 2023-09-20 MED ORDER — AMOXICILLIN-POT CLAVULANATE 875-125 MG PO TABS
1.0000 | ORAL_TABLET | Freq: Once | ORAL | Status: AC
  Administered 2023-09-20: 1 via ORAL
  Filled 2023-09-20: qty 1

## 2023-09-20 NOTE — ED Notes (Signed)
 Fluids and food given per Mds orders. Pt was able to tolerate fluids, but unable to tolerate the crackers.

## 2023-09-20 NOTE — Telephone Encounter (Signed)
 Attempted follow up call to [facility] client after recent ER visit. No answer, unable to leave message. Interpreter services assisted in call.  1st attempted call  Plan will attempt follow up again by phone on1/3/25.  Avelina JONELLE Skeen RN [employer]

## 2023-09-21 ENCOUNTER — Telehealth: Payer: Self-pay

## 2023-09-21 NOTE — Telephone Encounter (Signed)
 2nd attempt follow up after recent ER Visit. Interpreter services utilized. No answer, unable to leave a message.  Will continue to attempt follow up with [facility] client.   Francee Nodal RN [employer]

## 2023-10-10 ENCOUNTER — Other Ambulatory Visit: Payer: Self-pay | Admitting: Physician Assistant

## 2023-10-10 DIAGNOSIS — R7309 Other abnormal glucose: Secondary | ICD-10-CM

## 2023-10-10 DIAGNOSIS — Z13220 Encounter for screening for lipoid disorders: Secondary | ICD-10-CM

## 2023-10-10 DIAGNOSIS — R7989 Other specified abnormal findings of blood chemistry: Secondary | ICD-10-CM

## 2023-10-10 DIAGNOSIS — Z131 Encounter for screening for diabetes mellitus: Secondary | ICD-10-CM

## 2023-10-24 ENCOUNTER — Ambulatory Visit: Payer: Self-pay | Admitting: Physician Assistant

## 2023-10-25 ENCOUNTER — Ambulatory Visit: Payer: Self-pay | Admitting: Physician Assistant

## 2023-10-25 ENCOUNTER — Other Ambulatory Visit (HOSPITAL_COMMUNITY)
Admission: RE | Admit: 2023-10-25 | Discharge: 2023-10-25 | Disposition: A | Discharge status: Home / Self Care | Payer: Self-pay | Source: Ambulatory Visit | Attending: Physician Assistant | Admitting: Physician Assistant

## 2023-10-25 ENCOUNTER — Encounter: Payer: Self-pay | Admitting: Physician Assistant

## 2023-10-25 VITALS — BP 130/81 | HR 75 | Temp 97.9°F | Wt 183.0 lb

## 2023-10-25 DIAGNOSIS — Z131 Encounter for screening for diabetes mellitus: Secondary | ICD-10-CM | POA: Insufficient documentation

## 2023-10-25 DIAGNOSIS — Z13220 Encounter for screening for lipoid disorders: Secondary | ICD-10-CM | POA: Insufficient documentation

## 2023-10-25 DIAGNOSIS — R7989 Other specified abnormal findings of blood chemistry: Secondary | ICD-10-CM | POA: Insufficient documentation

## 2023-10-25 DIAGNOSIS — R7303 Prediabetes: Secondary | ICD-10-CM

## 2023-10-25 DIAGNOSIS — R7309 Other abnormal glucose: Secondary | ICD-10-CM | POA: Insufficient documentation

## 2023-10-25 DIAGNOSIS — Z789 Other specified health status: Secondary | ICD-10-CM

## 2023-10-25 DIAGNOSIS — R1031 Right lower quadrant pain: Secondary | ICD-10-CM

## 2023-10-25 LAB — COMPREHENSIVE METABOLIC PANEL
ALT: 19 U/L (ref 0–44)
AST: 17 U/L (ref 15–41)
Albumin: 3.7 g/dL (ref 3.5–5.0)
Alkaline Phosphatase: 111 U/L (ref 38–126)
Anion gap: 9 (ref 5–15)
BUN: 12 mg/dL (ref 6–20)
CO2: 21 mmol/L — ABNORMAL LOW (ref 22–32)
Calcium: 8.7 mg/dL — ABNORMAL LOW (ref 8.9–10.3)
Chloride: 109 mmol/L (ref 98–111)
Creatinine, Ser: 0.68 mg/dL (ref 0.44–1.00)
GFR, Estimated: >60 mL/min (ref >60)
Glucose, Bld: 108 mg/dL — ABNORMAL HIGH (ref 70–99)
Potassium: 3.9 mmol/L (ref 3.5–5.1)
Sodium: 139 mmol/L (ref 135–145)
Total Bilirubin: 0.7 mg/dL (ref 0.0–1.2)
Total Protein: 7.4 g/dL (ref 6.5–8.1)

## 2023-10-25 LAB — HEMOGLOBIN A1C
Hgb A1c MFr Bld: 5.8 % — ABNORMAL HIGH (ref 4.8–5.6)
Mean Plasma Glucose: 119.76 mg/dL

## 2023-10-25 LAB — LIPID PANEL
Cholesterol: 150 mg/dL (ref 0–200)
HDL: 51 mg/dL (ref >40)
LDL Cholesterol: 77 mg/dL (ref 0–99)
Total CHOL/HDL Ratio: 2.9 {ratio}
Triglycerides: 109 mg/dL (ref <150)
VLDL: 22 mg/dL (ref 0–40)

## 2023-10-25 NOTE — Progress Notes (Signed)
 BP 130/81   Pulse 75   Temp 97.9 F (36.6 C)   Wt 183 lb (83 kg)   LMP  (LMP Unknown)   SpO2 97%   BMI 36.96 kg/m    Subjective:    Patient ID: Alison Mendoza, female    DOB: November 06, 1980, 43 y.o.   MRN: [id number]  HPI: Alison Mendoza is a 43 y.o. female presenting on 10/25/2023 for No chief complaint on file.   HPI   Pt is 42yoF who is in today for routine f/u.  She got her hysterectomy in October.  She is having a bit of L abdominal pain from her surgery.  She is eating and moving her bowels normally.   She C/o itching in vagina- had same c/o at gyn appt 12/20 and testing was unremarkable.    She denies douching.    Pt Still with LLQ abdoingal pain like she had in ER in January although not as bad now but is there all the time.  CT done in ER showed diverticulosis without diverticulitis.  Pt has no change in pain with eating- not better or worse.   The Augmentin  given in ER only improved it a little bit.  She is having No diarrhea     Relevant past medical, surgical, family and social history reviewed and updated as indicated. Interim medical history since our last visit reviewed. Allergies and medications reviewed and updated.  No current outpatient medications on file.    Review of Systems  Per HPI unless specifically indicated above     Objective:    BP 130/81   Pulse 75   Temp 97.9 F (36.6 C)   Wt 183 lb (83 kg)   LMP  (LMP Unknown)   SpO2 97%   BMI 36.96 kg/m   Wt Readings from Last 3 Encounters:  10/25/23 183 lb (83 kg)  09/19/23 185 lb (83.9 kg)  09/07/23 186 lb 6.4 oz (84.6 kg)    Physical Exam Vitals reviewed.  Constitutional:      General: She is not in acute distress.    Appearance: She is well-developed. She is obese. She is not toxic-appearing.  HENT:     Head: Normocephalic and atraumatic.  Cardiovascular:     Rate and Rhythm: Normal rate and regular rhythm.  Pulmonary:     Effort: Pulmonary effort is normal.     Breath  sounds: Normal breath sounds.  Abdominal:     General: Bowel sounds are normal.     Palpations: Abdomen is soft. There is no hepatomegaly, splenomegaly, mass or pulsatile mass.     Tenderness: There is abdominal tenderness in the right lower quadrant. There is no guarding or rebound.     Comments: Very mild RLQ abd pain without rebound or guarding.  Musculoskeletal:     Cervical back: Neck supple.     Right lower leg: No edema.     Left lower leg: No edema.  Lymphadenopathy:     Cervical: No cervical adenopathy.  Skin:    General: Skin is warm and dry.  Neurological:     Mental Status: She is alert and oriented to person, place, and time.  Psychiatric:        Behavior: Behavior normal.     Results for orders placed or performed during the hospital encounter of 10/25/23  Lipid panel   Collection Time: 10/25/23  8:00 AM  Result Value Ref Range   Cholesterol 150 0 - 200 mg/dL   Triglycerides 890 <  150 mg/dL   HDL 51 >59 mg/dL   Total CHOL/HDL Ratio 2.9 RATIO   VLDL 22 0 - 40 mg/dL   LDL Cholesterol 77 0 - 99 mg/dL  Comprehensive metabolic panel   Collection Time: 10/25/23  8:00 AM  Result Value Ref Range   Sodium 139 135 - 145 mmol/L   Potassium 3.9 3.5 - 5.1 mmol/L   Chloride 109 98 - 111 mmol/L   CO2 21 (L) 22 - 32 mmol/L   Glucose, Bld 108 (H) 70 - 99 mg/dL   BUN 12 6 - 20 mg/dL   Creatinine, Ser 9.31 0.44 - 1.00 mg/dL   Calcium  8.7 (L) 8.9 - 10.3 mg/dL   Total Protein 7.4 6.5 - 8.1 g/dL   Albumin 3.7 3.5 - 5.0 g/dL   AST 17 15 - 41 U/L   ALT 19 0 - 44 U/L   Alkaline Phosphatase 111 38 - 126 U/L   Total Bilirubin 0.7 0.0 - 1.2 mg/dL   GFR, Estimated >39 >39 mL/min   Anion gap 9 5 - 15  Hemoglobin A1c   Collection Time: 10/25/23  8:00 AM  Result Value Ref Range   Hgb A1c MFr Bld 5.8 (H) 4.8 - 5.6 %   Mean Plasma Glucose 119.76 mg/dL      Assessment & Plan:    Encounter Diagnoses  Name Primary?   RLQ abdominal pain Yes   Prediabetes    Not proficient in  English language      -reviewed labs with pt -counseled on prediabetes -screening Mammogram - due in June -Trial of benefiber advanced- 1 /day.  Pt was given month supply of samples -encouraged to Walk daily 15-30 minutes -f/u 1 month for recheck.  She is to contact office sooner for worsening or new symptoms

## 2023-11-21 ENCOUNTER — Encounter: Payer: Self-pay | Admitting: Physician Assistant

## 2023-11-21 ENCOUNTER — Ambulatory Visit: Payer: Self-pay | Admitting: Physician Assistant

## 2023-11-21 ENCOUNTER — Other Ambulatory Visit (HOSPITAL_COMMUNITY)
Admission: RE | Admit: 2023-11-21 | Discharge: 2023-11-21 | Disposition: A | Discharge status: Home / Self Care | Payer: Self-pay | Source: Ambulatory Visit | Attending: Physician Assistant | Admitting: Physician Assistant

## 2023-11-21 VITALS — BP 124/74 | HR 70 | Temp 97.2°F | Ht 59.0 in | Wt 193.0 lb

## 2023-11-21 DIAGNOSIS — N898 Other specified noninflammatory disorders of vagina: Secondary | ICD-10-CM

## 2023-11-21 DIAGNOSIS — Z6838 Body mass index (BMI) 38.0-38.9, adult: Secondary | ICD-10-CM | POA: Insufficient documentation

## 2023-11-21 DIAGNOSIS — R635 Abnormal weight gain: Secondary | ICD-10-CM | POA: Insufficient documentation

## 2023-11-21 DIAGNOSIS — R1031 Right lower quadrant pain: Secondary | ICD-10-CM

## 2023-11-21 DIAGNOSIS — Z789 Other specified health status: Secondary | ICD-10-CM

## 2023-11-21 DIAGNOSIS — E66812 Obesity, class 2: Secondary | ICD-10-CM | POA: Insufficient documentation

## 2023-11-21 LAB — TSH: TSH: 2.006 u[IU]/mL (ref 0.350–4.500)

## 2023-11-21 NOTE — Progress Notes (Signed)
 BP 124/74   Pulse 70   Temp (!) 97.2 F (36.2 C)   Ht 4\' 11"  (1.499 m)   Wt 193 lb (87.5 kg)   LMP  (LMP Unknown)   SpO2 98%   BMI 38.98 kg/m    Subjective:    Patient ID: [name], female    DOB: 1980-12-24, 43 y.o.   MRN: [id number]  HPI: Alison Mendoza is a 43 y.o. female presenting on 11/21/2023 for Abdominal Pain (Pt c/o RLQ and pain where her surgery was. No changes within the last month. Pt states it itches sometimes and when she scratches it has a tingling sensation. ), Weight Gain (Pt has gained 10 lbs within a month. Pt states she has not increased the foods she eats and sometimes eats less than usual. Pt is concerned she may have problems with her thyroid), and Vaginal Itching (Pt c/o vaginal itching for over a month. Pt has used OTC cream which helped some. Pt has clear smelly discharge)   HPI   Chief Complaint  Patient presents with   Abdominal Pain    Pt c/o RLQ and pain where her surgery was. No changes within the last month. Pt states it itches sometimes and when she scratches it has a tingling sensation.    Weight Gain    Pt has gained 10 lbs within a month. Pt states she has not increased the foods she eats and sometimes eats less than usual. Pt is concerned she may have problems with her thyroid   Vaginal Itching    Pt c/o vaginal itching for over a month. Pt has used OTC cream which helped some. Pt has clear smelly discharge      She used the benefiber.  She says it helped with her pain.  She says her pain is better but now she has some pain where she had her surgery (hysterectomy)  Her bowels are moving.  Pt concerned about her weight gain.    She says she walks 2 or 3 times/week for about 30 minutes.  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  Pt again with c/o vaginal itching.  She has this complaint chronically and was tested by gyn in December.      Relevant past medical, surgical, family and social  history reviewed and updated as indicated. Interim medical history since our last visit reviewed. Allergies and medications reviewed and updated.   Current Outpatient Medications:    Wheat Dextrin (BENEFIBER) POWD, Take by mouth daily., Disp: , Rfl:     Review of Systems  Per HPI unless specifically indicated above     Objective:    BP 124/74   Pulse 70   Temp (!) 97.2 F (36.2 C)   Ht 4\' 11"  (1.499 m)   Wt 193 lb (87.5 kg)   LMP  (LMP Unknown)   SpO2 98%   BMI 38.98 kg/m   Wt Readings from Last 3 Encounters:  11/21/23 193 lb (87.5 kg)  10/25/23 183 lb (83 kg)  09/19/23 185 lb (83.9 kg)    Physical Exam Vitals reviewed.  Constitutional:      General: She is not in acute distress.    Appearance: She is obese. She is not toxic-appearing.  HENT:     Head: Normocephalic and atraumatic.  Neck:     Thyroid: No thyroid mass, thyromegaly or thyroid tenderness.  Cardiovascular:     Rate and Rhythm: Normal rate and regular rhythm.  Pulmonary:  Effort: Pulmonary effort is normal. No respiratory distress.     Breath sounds: Normal breath sounds. No wheezing or rhonchi.  Abdominal:     General: Bowel sounds are normal.     Palpations: Abdomen is soft. There is no hepatomegaly, splenomegaly, mass or pulsatile mass.     Tenderness: There is abdominal tenderness in the right lower quadrant. There is no guarding or rebound.     Comments: Very mild RLQ tenderness without rebound or guarding.  Surgical scars healing without signs infection.   Musculoskeletal:     Cervical back: Neck supple.  Lymphadenopathy:     Cervical: No cervical adenopathy.  Skin:    General: Skin is warm and dry.  Neurological:     Mental Status: She is alert and oriented to person, place, and time.  Psychiatric:        Behavior: Behavior normal.            Assessment & Plan:     Encounter Diagnoses  Name Primary?   RLQ abdominal pain Yes   Weight gain    Class 2 obesity with body  mass index (BMI) of 38.0 to 38.9 in adult, unspecified obesity type, unspecified whether serious comorbidity present    Vaginal itching    Not proficient in [place] language      -Ordered aptima swabs- will call pt when they arrive- to send out test for vag itching -pt to Continue probiotics -pt encouraged to Increase exercise and watch portions for weight management. Pt was given handout.  Will check tsh  Pt will be scheduled to RTO in 3 months.

## 2023-11-21 NOTE — Patient Instructions (Signed)
 Obesidad en los adultos Obesity, Adult La obesidad es una afeccin que implica tener demasiada grasa corporal total. Tener sobrepeso u obesidad significa que el peso es mayor que lo que se considera saludable para el Biochemist, clinical. La obesidad se determina mediante una medida denominada IMC (ndice de masa muscular). El IMC (ndice de masa corporal) es la estimacin de la grasa corporal y se calcula a partir de la altura y [place]. Si un adulto tiene un [facility] de 30 o superior se considera obeso. La obesidad puede conducir a algunos de los siguientes problemas de salud y [place] graves: Accidente cerebrovascular. Arteriopata coronaria (EAC). Diabetes tipo 2. Algunos tipos de cncer, incluido el cncer de colon, mama, tero y vescula. Presin arterial alta (hipertensin arterial). Colesterol alto. Clculos en la vescula biliar. La obesidad tambin puede contribuir a lo siguiente: Artrosis. Apnea del sueo. Problemas de esterilidad. Cules son las causas? Las causas ms frecuentes de esta afeccin [employer] siguientes: Consumir diariamente alimentos con altos niveles de caloras, azcar y [place] Beber grandes cantidades de bebidas endulzadas con azcar, como refrescos. Nacer con genes que pueden hacerlo ms propenso a ser obeso. Tener una afeccin que causa obesidad, por ejemplo: Hipotiroidismo. Sndrome del ovario poliqustico (SOP). Trastorno alimentario compulsivo. Sndrome de [place]. Tomar ciertos medicamentos, como esteroides, antidepresivos y [place]. No ser fsicamente activo (estilo de vida sedentario). No dormir lo suficiente. Qu incrementa el riesgo? Los siguientes factores pueden hacer que sea ms propenso a contraer esta afeccin: Tener antecedentes familiares de obesidad. Vivir en un rea con acceso limitado a las siguientes posibilidades: Parques, centros recreativos o veredas. Alimentos saludables, como se venden en tiendas de comestibles y mercados  de Event organiser. Cules son los signos o sntomas? El principal signo de esta afeccin es tener demasiada grasa corporal. Cmo se diagnostica? Esta afeccin se diagnostica en funcin de lo siguiente: Su IMC. Si usted es un adulto y su IMC es de 30 o ms, se considera que es obeso. La circunferencia de la cintura. Es Neomia Dear medicin alrededor de Lobbyist. El grosor del pliegue cutneo. El mdico puede pellizcar suavemente un pliegue de la piel y [place]. Es posible que le hagan otros estudios para ver si hay afecciones subyacentes. Cmo se trata? El tratamiento de esta afeccin frecuentemente incluye cambiar el estilo de vida. El tratamiento puede incluir algunos o todos los siguientes elementos: Cambios en la dieta. Esto puede incluir el desarrollo de un plan de alimentacin saludable. Realizar actividad fsica con regularidad. Puede incluir una actividad que hace que el corazn lata ms rpido (ejercicio [place]) y Fish farm manager de Pensions consultant. Trabajar con el mdico para disear un programa de ejercicios que sea adecuado para usted. Medicamentos para ayudarle a Publishing copy de peso si no puede perder [employer] por semana despus de seis semanas de comer de manera saludable y de hacer ms actividad fsica. Tratar las afecciones que causan la obesidad (afecciones preexistentes). Ciruga. Las opciones quirrgicas pueden incluir bandas gstricas y bypass gstrico. Se puede realizar una ciruga si: Otros tratamientos no mejoraron su afeccin. Tiene un IMC de 40 o superior. Tiene problemas de salud potencialmente mortales relacionados con la obesidad. Siga estas indicaciones en su casa: Comida y bebida  Siga las instrucciones del mdico respecto de lo que puede comer o beber. El mdico puede indicarle que haga lo siguiente: Limitar las comidas rpidas, los dulces y las colaciones procesadas. Elegir opciones con bajo contenido de [place], como leche descremada en lugar de [place] entera. Consumir cinco o ms  porciones de  frutas o verduras por da. Elegir alimentos saludables cuando coma afuera. Tener a mano colaciones con bajo contenido de [place]. Limitar las bebidas azucaradas, como refrescos, jugo de frutas, t helado endulzado y [place] saborizada. Beba suficiente agua para mantener la orina de color amarillo plido. No siga una dieta de [place]. Las dietas de moda pueden ser poco saludables e incluso peligrosas. Otras opciones saludables incluyen: Comer en casa con ms frecuencia. Esto le da ms control sobre lo que come. Aprender a leer las etiquetas de los alimentos. Esto le ayudar a entender cunta comida se considera una porcin. Aprender cul es el tamao de una porcin saludable. Actividad fsica Realice ejercicio con regularidad como se lo haya indicado el mdico. La mayora de los adultos deben hacer hasta 150 minutos de ejercicio de intensidad moderada cada semana. Consulte al mdico qu tipo de ejercicios es seguro para usted y con qu frecuencia debe ejercitarse. Precaliente y elongue adecuadamente antes de hacer actividad fsica. Reljese y elongue despus de hacer actividad fsica. Descanse entre los perodos de [place]. Estilo de vida Trabaje con el mdico y un nutricionista para Ship broker una meta de prdida de peso que sea saludable y razonable para usted. Limite el tiempo que pasa frente a una pantalla. Busque formas de recompensarse que no incluyan alimentos. No beba alcohol si: Su mdico le indica no hacerlo. Est embarazada, puede estar embarazada o est tratando de [place]. Si bebe alcohol: Limite la cantidad que bebe a lo siguiente: De 0 a 1 medida por da para las mujeres. De 0 a 2 medidas por da para los hombres. Sepa cunta cantidad de alcohol hay en las bebidas que toma. En los [address], una medida equivale a una botella de cerveza de 12 oz (355 ml), un vaso de vino de 5 oz (148 ml) o un vaso de una bebida alcohlica de alta graduacin de 1 oz  (44 ml). Indicaciones generales Registre la prdida de peso en un diario para Education officer, environmental un seguimiento de los alimentos que consume y cunto se Pension scheme manager. Use los medicamentos de venta libre y los recetados solamente como se lo haya indicado el mdico. Tome vitaminas y suplementos solamente como se lo haya indicado el mdico. Considere la posibilidad de Advertising account planner en un grupo de apoyo. El mdico podra recomendarle un grupo de apoyo. Preste atencin a Radiographer, therapeutic mental, ya que la obesidad puede provocar depresin o problemas de [place]. Concurra a todas las visitas de seguimiento. Esto es importante. Comunquese con un mdico si: No puede alcanzar su objetivo de prdida de peso despus de seis semanas de cambios en la dieta y en el estilo de vida. Tiene dificultad para respirar. Resumen La obesidad es una afeccin que implica tener demasiada grasa corporal total. Tener sobrepeso u obesidad significa que el peso es mayor que lo que se considera saludable para el tamao corporal. Alison Mendoza con el mdico y un nutricionista para establecer una meta de prdida de peso que sea saludable y razonable para usted. Realice ejercicio con regularidad como se lo haya indicado el mdico. Consulte al mdico qu tipo de ejercicios es seguro para usted y con qu frecuencia debe ejercitarse. Esta informacin no tiene Theme park manager el consejo del mdico. Asegrese de hacerle al mdico cualquier pregunta que tenga. Document Revised: 05/03/2021 Document Reviewed: 05/03/2021 Elsevier Patient Education  2024 [employer].

## 2023-11-28 ENCOUNTER — Encounter: Payer: Self-pay | Admitting: Physician Assistant

## 2023-11-28 ENCOUNTER — Other Ambulatory Visit (HOSPITAL_COMMUNITY)
Admission: RE | Admit: 2023-11-28 | Discharge: 2023-11-28 | Disposition: A | Discharge status: Home / Self Care | Payer: Self-pay | Source: Ambulatory Visit | Attending: Physician Assistant | Admitting: Physician Assistant

## 2023-11-28 ENCOUNTER — Ambulatory Visit: Payer: Self-pay | Admitting: Physician Assistant

## 2023-11-28 VITALS — BP 132/82 | HR 96 | Temp 97.5°F | Ht 59.0 in

## 2023-11-28 DIAGNOSIS — N898 Other specified noninflammatory disorders of vagina: Secondary | ICD-10-CM | POA: Insufficient documentation

## 2023-11-28 NOTE — Progress Notes (Signed)
   BP 132/82   Pulse 96   Temp (!) 97.5 F (36.4 C)   Ht 4\' 11"  (1.499 m)   LMP  (LMP Unknown)   SpO2 97%   BMI 38.98 kg/m    Subjective:    Patient ID: Alison Mendoza, female    DOB: 08-07-1981, 43 y.o.   MRN: [id number]  HPI: Alison Mendoza is a 43 y.o. female presenting on 11/28/2023 for Aptima Swab   HPI  Pt is 42yoF in today for additional testing due to persistent complaints of vaginal itching.   This is test needed at appt last week but swabs unavailable at that time.    Relevant past medical, surgical, family and social history reviewed and updated as indicated. Interim medical history since our last visit reviewed. Allergies and medications reviewed and updated.  Review of Systems  Per HPI unless specifically indicated above     Objective:    BP 132/82   Pulse 96   Temp (!) 97.5 F (36.4 C)   Ht 4\' 11"  (1.499 m)   LMP  (LMP Unknown)   SpO2 97%   BMI 38.98 kg/m   Wt Readings from Last 3 Encounters:  11/21/23 193 lb (87.5 kg)  10/25/23 183 lb (83 kg)  09/19/23 185 lb (83.9 kg)    Physical Exam Exam conducted with a chaperone present.  Constitutional:      General: She is not in acute distress.    Appearance: She is obese. She is not toxic-appearing.  HENT:     Head: Normocephalic and atraumatic.  Pulmonary:     Effort: Pulmonary effort is normal. No respiratory distress.  Genitourinary:    Comments: (Nurse Berenice assisted) Pt in lithotomy position. Aptima swab inserted in vagina.   Skin:    General: Skin is warm and dry.  Neurological:     Mental Status: She is alert and oriented to person, place, and time.  Psychiatric:        Behavior: Behavior normal.           Assessment & Plan:     Encounter Diagnosis  Name Primary?   Vaginal itching Yes    Aptima swab sent to lab to test vag itch.  Pt will be called with results

## 2023-11-30 LAB — CERVICOVAGINAL ANCILLARY ONLY
Bacterial Vaginitis (gardnerella): NEGATIVE
Candida Glabrata: NEGATIVE
Candida Vaginitis: NEGATIVE
Comment: NEGATIVE
Comment: NEGATIVE
Comment: NEGATIVE

## 2023-12-05 ENCOUNTER — Ambulatory Visit: Payer: Self-pay | Admitting: Physician Assistant

## 2023-12-25 ENCOUNTER — Ambulatory Visit: Payer: Self-pay | Admitting: Physician Assistant

## 2023-12-25 ENCOUNTER — Encounter: Payer: Self-pay | Admitting: Physician Assistant

## 2023-12-25 VITALS — BP 127/80 | HR 88 | Temp 97.1°F | Ht 59.0 in

## 2023-12-25 DIAGNOSIS — Z789 Other specified health status: Secondary | ICD-10-CM

## 2023-12-25 DIAGNOSIS — R300 Dysuria: Secondary | ICD-10-CM

## 2023-12-25 DIAGNOSIS — N3090 Cystitis, unspecified without hematuria: Secondary | ICD-10-CM

## 2023-12-25 LAB — POCT URINALYSIS DIPSTICK
Bilirubin, UA: NEGATIVE
Blood, UA: NEGATIVE
Glucose, UA: NEGATIVE
Ketones, UA: NEGATIVE
Nitrite, UA: POSITIVE
Protein, UA: POSITIVE — AB
Spec Grav, UA: >=1.03 — AB (ref 1.010–1.025)
Urobilinogen, UA: 0.2 U/dL
pH, UA: 5.5 (ref 5.0–8.0)

## 2023-12-25 MED ORDER — CEPHALEXIN 500 MG PO CAPS: 500.0000 mg | ORAL_CAPSULE | Freq: Four times a day (QID) | ORAL | 0 refills | Status: AC

## 2023-12-25 NOTE — Patient Instructions (Signed)
 Infeccin de las vas urinarias en las mujeres Urinary Tract Infection, Female La infeccin de las vas urinarias (IVU) se presenta en las vas urinarias. Las vas urinarias estn formadas por rganos que producen, Barrister's clerk y eliminan la orina en el cuerpo. Estos rganos incluyen los siguientes: Los riones. Los urteres. La vejiga. La uretra. Cules son las causas? La mayora de las IVU son causadas por grmenes llamados bacterias. Pueden estar dentro o cerca de los genitales. Estos grmenes proliferan y causan hinchazn en las vas urinarias. Qu incrementa el riesgo? Una persona es ms propensa a tener una IVU si: Es mujer. La uretra es ms corta en las mujeres que en los hombres. Tiene colocado un tubo blando llamado catter que drena la orina. No puede controlar cuando orina o defeca. Tiene dificultad para orinar debido a: Un clculo renal. Una obstruccin urinaria. Un trastorno nervioso que afecta la vejiga. No bebe una cantidad suficiente de lquido. Es sexualmente activa. [place] un mtodo anticonceptivo que se coloca dentro de la vagina, como un espermicida. Est embarazada. Tiene niveles bajos de la hormona estrgeno en el cuerpo. Es Psychologist, sport and exercise. Tambin es ms probable que contraiga una IVU si tiene otros problemas de [place]. Pueden incluir: Diabetes. El sistema inmunitario debilitado. Su sistema inmunitario es [address] de defensa de su cuerpo. Anemia drepanoctica. Lesin en la columna vertebral. Cules son los signos o sntomas? Entre los sntomas, se pueden incluir los siguientes: Necesidad inmediata de Geographical information systems officer. Hacer poca cantidad de orina con mucha frecuencia. Dolor o ardor al Geographical information systems officer. Sangre en la orina. Orina con mal olor u [employer]. Dolor en la parte inferior de la espalda o en el vientre. Tambin puede: Sentirse confundido. Este puede ser Financial risk analyst sntoma en los adultos [place]. Vomitar. No tener apetito. Cansarse o irritarse con facilidad. Tener  fiebre o escalofros. Cmo se diagnostica? Las IVU se diagnostican en funcin de los antecedentes mdicos y de Nurse, learning disability. Tambin pueden hacerle otros estudios. Pueden incluir: Anlisis de [place]. Anlisis de [place]. Pruebas de infecciones de transmisin sexual (ITS). Si ha tenido ms de una IVU, es posible que deba hacerse estudios de diagnstico por imgenes para Financial risk analyst por qu sigue contrayndolas. Cmo se trata? Una IVU puede tratarse de las siguientes maneras: Tomar antibiticos u otros medicamentos. Beber suficiente lquido como para Pharmacologist la orina de color amarillo plido. En casos poco frecuentes, una IVU puede causar una afeccin muy grave llamada sepsis. Es posible que la sepsis deba recibir Pharmacist, hospital hospital. Siga estas instrucciones en su casa: Medicamentos Tome los medicamentos solamente como se lo haya indicado el mdico. Si le dieron antibiticos, tmelos o selos como se lo haya indicado el mdico. No deje de tomarlos aunque comience a sentirse mejor. Instrucciones generales Asegrese de hacer lo siguiente: Orine con frecuencia y vace la vejiga por completo. No contenga la [employer]. Lmpiese de adelante hacia atrs despus de Automotive engineer. Use cada trozo de papel higinico una sola vez cuando se limpie. Orine despus de [employer]. No se haga duchas vaginales ni use aerosoles o talcos en la zona genital. Comunquese con un mdico si: Sus sntomas no han mejorado despus de 1 o 2 das de tratamiento con antibiticos. Los sntomas desaparecen y Stage manager. Tiene fiebre o escalofros. Vomita o tiene ganas de vomitar. Solicite ayuda de inmediato si: Tiene dolor muy intenso en la espalda o la parte baja del vientre. Se desmaya. Esta informacin no tiene Theme park manager el consejo del mdico. Manufacturing engineer  de hacerle al mdico cualquier pregunta que tenga. Document Revised: 05/11/2023 Document Reviewed: 05/11/2023 Elsevier Patient  Education  2024 [employer].

## 2023-12-25 NOTE — Progress Notes (Signed)
   BP 127/80   Pulse 88   Temp (!) 97.1 F (36.2 C)   Ht 4\' 11"  (1.499 m)   LMP  (LMP Unknown)   SpO2 98%   BMI 38.98 kg/m    Subjective:    Patient ID: [name], female    DOB: [birth year], 43 y.o.   MRN: [id number]  HPI: Alison Mendoza is a 43 y.o. female presenting on 12/25/2023 for Dysuria (For about 3 weeks. Pt states she has urinary frequency and urinary urge, some burning when urinating, and lower back pain. Pt has drink some herbal teas and other home remedies which have not helped.)   HPI  Chief Complaint  Patient presents with   Dysuria    For about 3 weeks. Pt states she has urinary frequency and urinary urge, some burning when urinating, and lower back pain. Pt has drink some herbal teas and other home remedies which have not helped.      Relevant past medical, surgical, family and social history reviewed and updated as indicated. Interim medical history since our last visit reviewed. Allergies and medications reviewed and updated.   CURRENT MEDS: none    Review of Systems  Per HPI unless specifically indicated above     Objective:    BP 127/80   Pulse 88   Temp (!) 97.1 F (36.2 C)   Ht 4\' 11"  (1.499 m)   LMP  (LMP Unknown)   SpO2 98%   BMI 38.98 kg/m   Wt Readings from Last 3 Encounters:  11/21/23 193 lb (87.5 kg)  10/25/23 183 lb (83 kg)  09/19/23 185 lb (83.9 kg)    Physical Exam Constitutional:      General: She is not in acute distress.    Appearance: She is not toxic-appearing.  HENT:     Head: Normocephalic and atraumatic.  Pulmonary:     Effort: Pulmonary effort is normal. No respiratory distress.  Skin:    General: Skin is warm and dry.  Neurological:     Mental Status: She is alert and oriented to person, place, and time.     Gait: Gait normal.  Psychiatric:        Behavior: Behavior normal.       UA +nit    Assessment & Plan:      Encounter Diagnoses  Name Primary?   Cystitis Yes   Dysuria     Not proficient in English language       -rx keflex -drink plenty water -RTO if worsens or persists

## 2024-01-10 ENCOUNTER — Other Ambulatory Visit: Payer: Self-pay | Admitting: Physician Assistant

## 2024-01-10 MED ORDER — FLUCONAZOLE 150 MG PO TABS: 150.0000 mg | ORAL_TABLET | Freq: Once | ORAL | 0 refills | Status: AC

## 2024-02-20 ENCOUNTER — Ambulatory Visit: Payer: Self-pay | Admitting: Physician Assistant

## 2024-02-20 ENCOUNTER — Encounter: Payer: Self-pay | Admitting: Physician Assistant

## 2024-02-20 DIAGNOSIS — Z1239 Encounter for other screening for malignant neoplasm of breast: Secondary | ICD-10-CM

## 2024-02-20 DIAGNOSIS — Z789 Other specified health status: Secondary | ICD-10-CM

## 2024-02-20 DIAGNOSIS — R112 Nausea with vomiting, unspecified: Secondary | ICD-10-CM

## 2024-02-20 DIAGNOSIS — N898 Other specified noninflammatory disorders of vagina: Secondary | ICD-10-CM

## 2024-02-20 DIAGNOSIS — R109 Unspecified abdominal pain: Secondary | ICD-10-CM

## 2024-02-20 MED ORDER — ONDANSETRON HCL 4 MG PO TABS: 4.0000 mg | ORAL_TABLET | Freq: Three times a day (TID) | ORAL | 0 refills | Status: DC | PRN

## 2024-02-20 NOTE — Progress Notes (Signed)
 LMP  (LMP Unknown)    Subjective:    Patient ID: Alison Mendoza, female    DOB: 04-Jul-1981, 43 y.o.   MRN: [id number]  HPI: Alison Mendoza is a 43 y.o. female presenting on 02/20/2024 for No chief complaint on file.   HPI   Pt was scheduled for in-office appointment today but called this morning requesting it be changed to virtual appointment.   This is a telemedicine appointment through [employer].  I connected with  Alison Mendoza on 02/20/24 by a video enabled telemedicine application and verified that I am speaking with the correct person using two identifiers.   I discussed the limitations of evaluation and management by telemedicine. The patient expressed understanding and agreed to proceed.  Patient is at work.  Provider and translator are at office.   Pt says her Stomach pain has decreased but not resolved.  She says she is Now having nausea, vomting and  fatigue.  She says the nausea has been a particular problem for about 2 months.  She has Emesis 1 or 2 times/week but the Nausea is all the time.  She says her BMs are normal and and regular.  She says she has No diarrhea no blood PR.  She says that eating does not change the pain.  She is using some leftover zofran .  She has seen [facility] for this in the past.  She also C/o lots of discharge since her surgery.  She has had multiple checks f this which have been negative for BV and candida.      Relevant past medical, surgical, family and social history reviewed and updated as indicated. Interim medical history since our last visit reviewed. Allergies and medications reviewed and updated.  CURRENT MEDS: none  Review of Systems  Per HPI unless specifically indicated above     Objective:     LMP  (LMP Unknown)   Wt Readings from Last 3 Encounters:  11/21/23 193 lb (87.5 kg)  10/25/23 183 lb (83 kg)  09/19/23 185 lb (83.9 kg)    Physical Exam Constitutional:      General: She is not in acute  distress.    Appearance: She is not toxic-appearing.  HENT:     Head: Normocephalic and atraumatic.  Pulmonary:     Effort: Pulmonary effort is normal. No respiratory distress.     Comments: Pt is talking in complete sentences without dyspnea.  Neurological:     Mental Status: She is alert and oriented to person, place, and time.  Psychiatric:        Behavior: Behavior normal.           Assessment & Plan:    Encounter Diagnoses  Name Primary?   Nausea and vomiting, unspecified vomiting type Yes   Abdominal pain, unspecified abdominal location    Encounter for screening for malignant neoplasm of breast, unspecified screening modality    Vaginal discharge    Not proficient in English language       -will Re-refer to GI for persistent abdominal pain, nausea and vomiting -She is using some leftover zofran  and requests refill- to  [facility].  Cautioned pt that this medication can cause her to feel fatigued. -pt is encouraged to get STD testing at [facility] -pt is to stop by office to sign mammogram scholarship application because she is due for updating her breast cancer screening -pt is encouraged to submit new application for [facility] [employer] financial assistance -pt to RTO in 3 months. She  is to contact office sooner prn

## 2024-03-04 ENCOUNTER — Other Ambulatory Visit: Payer: Self-pay | Admitting: Physician Assistant

## 2024-03-04 DIAGNOSIS — Z1231 Encounter for screening mammogram for malignant neoplasm of breast: Secondary | ICD-10-CM

## 2024-03-28 ENCOUNTER — Ambulatory Visit (HOSPITAL_COMMUNITY)
Admission: RE | Admit: 2024-03-28 | Discharge: 2024-03-28 | Disposition: A | Discharge status: Home / Self Care | Payer: Self-pay | Source: Ambulatory Visit | Attending: Physician Assistant | Admitting: Physician Assistant

## 2024-03-28 ENCOUNTER — Encounter: Payer: Self-pay | Admitting: Physician Assistant

## 2024-03-28 ENCOUNTER — Encounter (HOSPITAL_COMMUNITY): Payer: Self-pay

## 2024-03-28 DIAGNOSIS — Z1231 Encounter for screening mammogram for malignant neoplasm of breast: Secondary | ICD-10-CM | POA: Insufficient documentation

## 2024-04-23 ENCOUNTER — Encounter: Payer: Self-pay | Admitting: Physician Assistant

## 2024-04-23 NOTE — Progress Notes (Signed)
 Received voicemail from [facility] regarding updated pink card for patient.  Returned call at [phone] and provided phone number to Wanda for Mammography scholarship information.

## 2024-04-29 IMAGING — MG MM BREAST LOCALIZATION CLIP
4 series · 4 of 12 positions shown · non-contrast
Comparison: Previous exam(s).

CLINICAL DATA: Post ultrasound-guided biopsy of a mass in the right
breast at the 10 o'clock position.

EXAM:
3D DIAGNOSTIC RIGHT MAMMOGRAM POST ULTRASOUND BIOPSY

[R ML synth-2D]
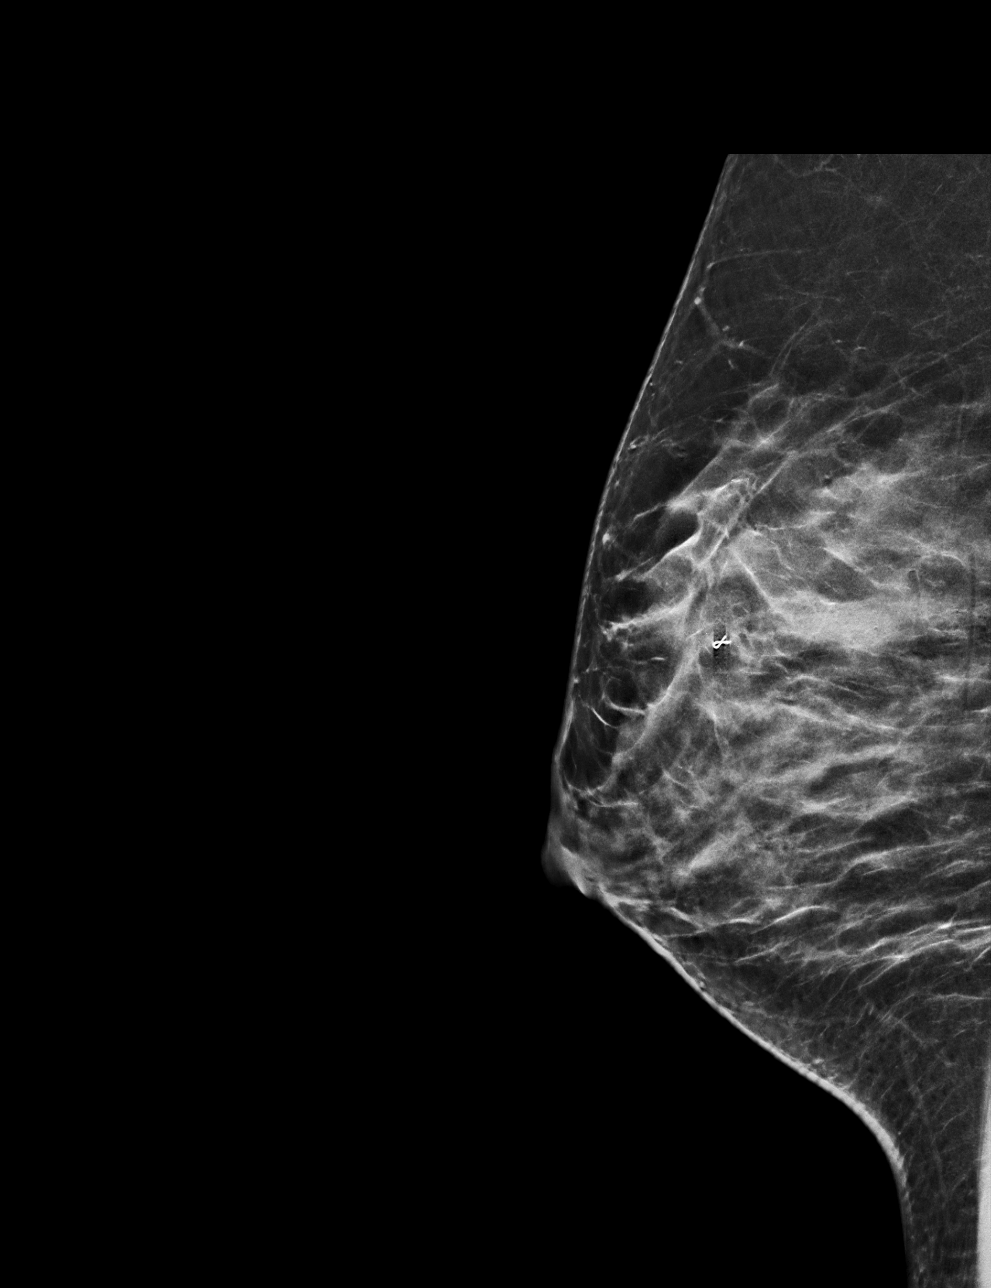

[R CC synth-2D]
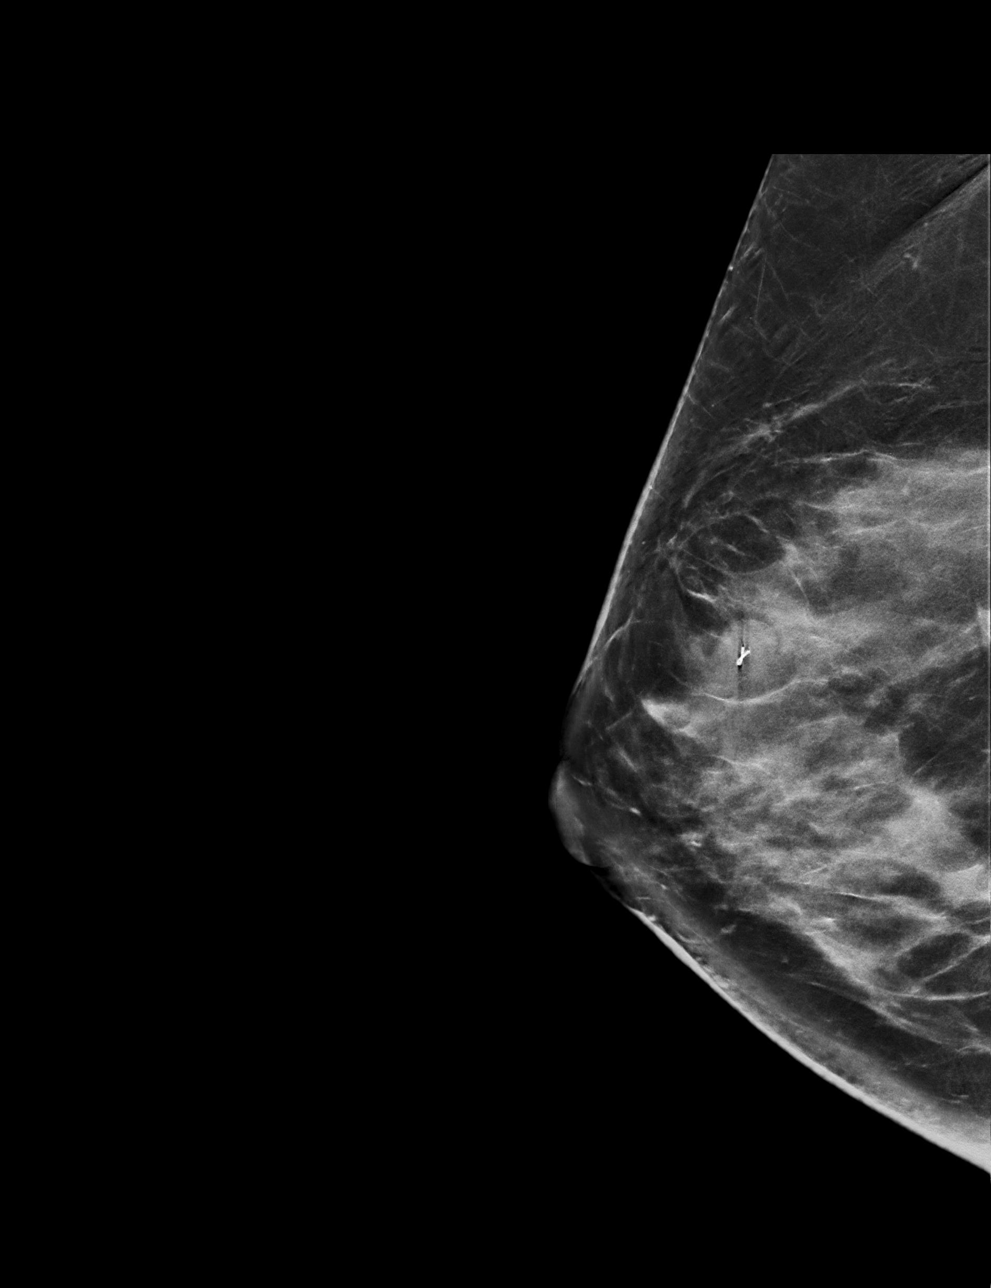

[R CC tomo · tomo slice 37/72.0]
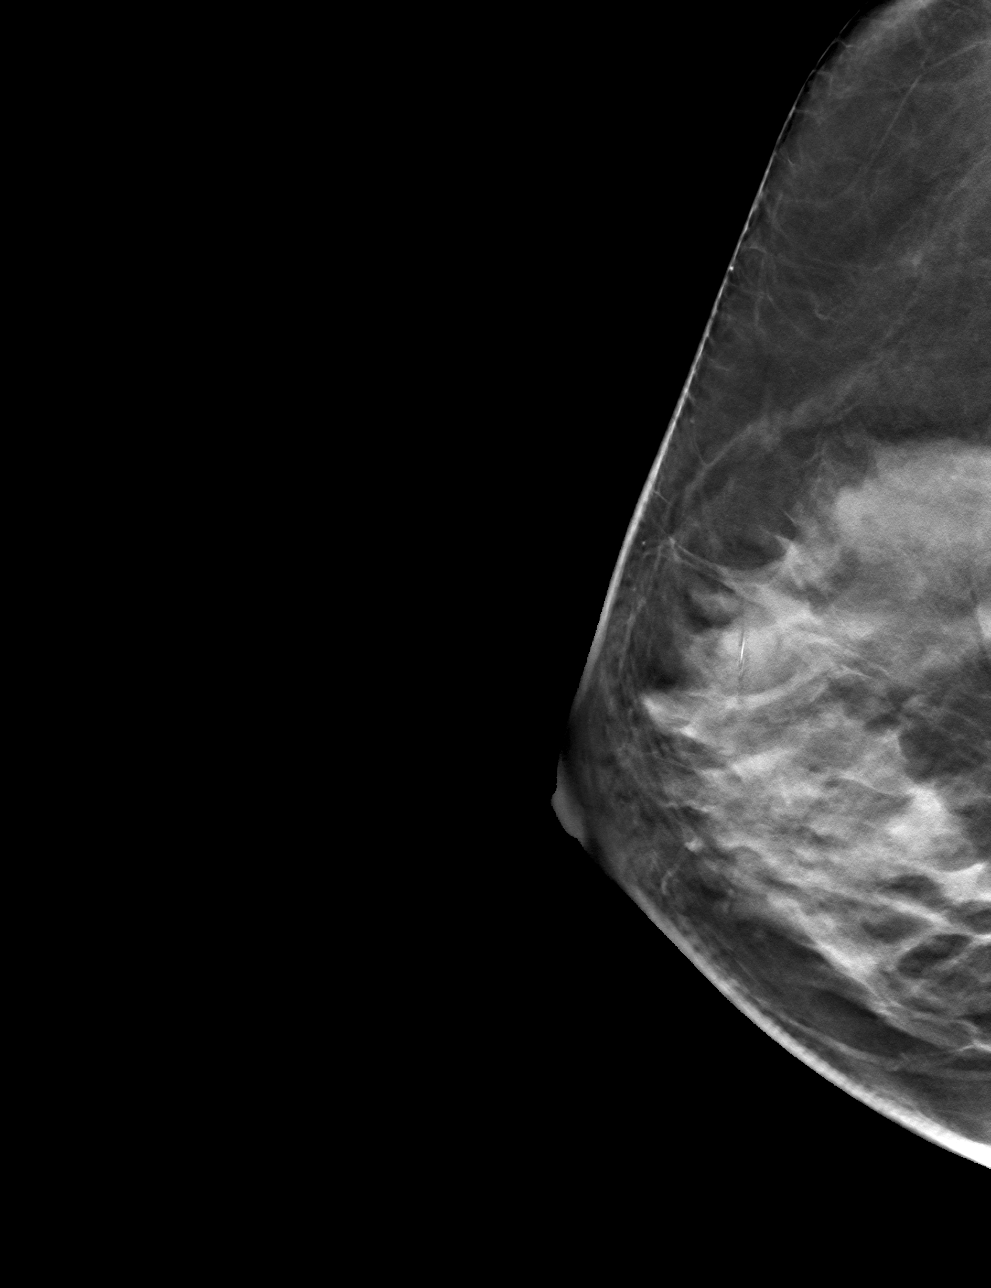

[R ML tomo · tomo slice 31/60.0]
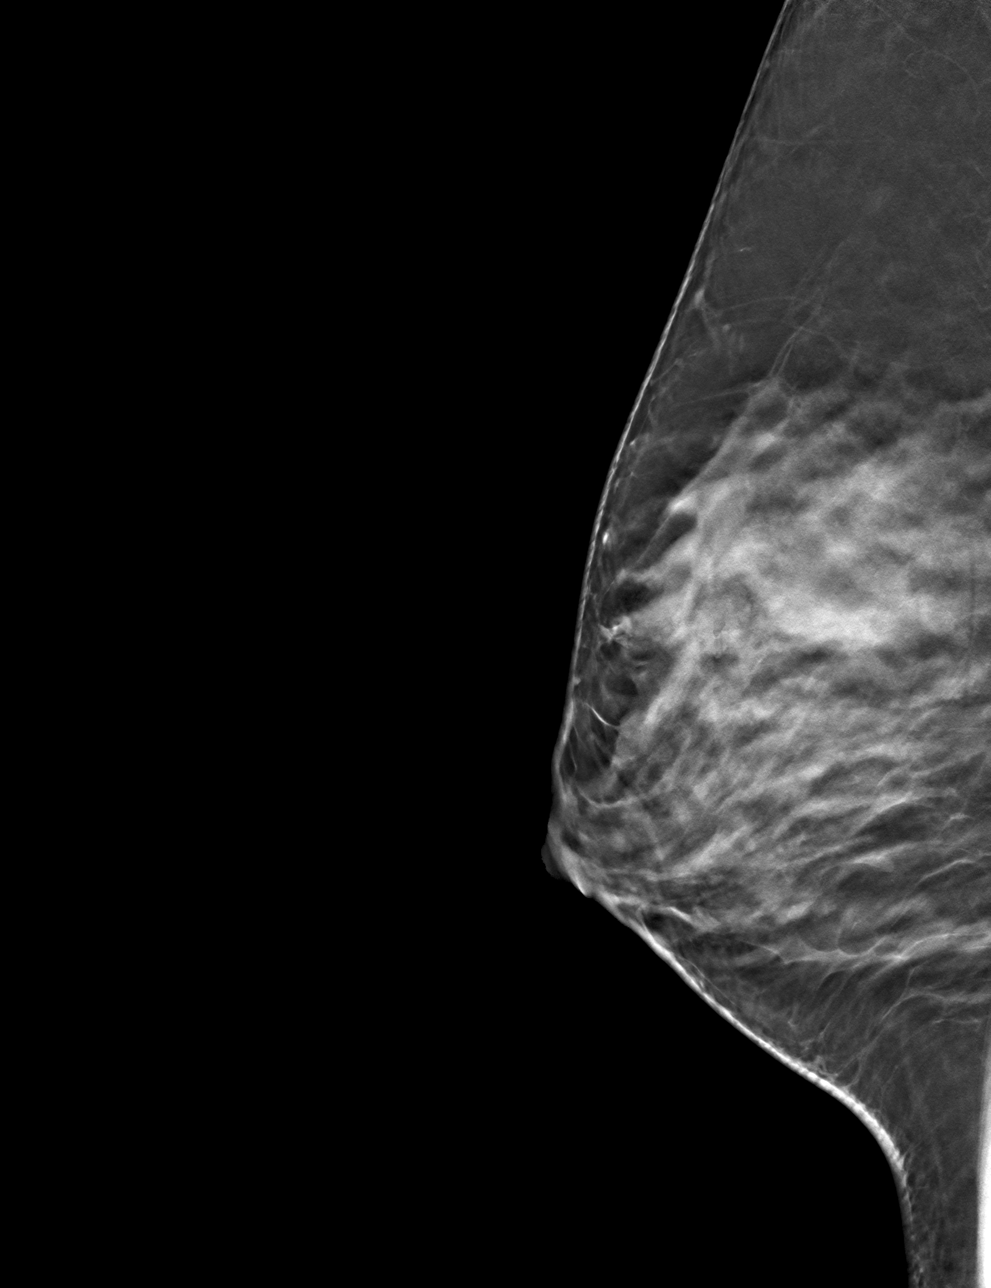

[4 of 12 positions shown; findings below may reference images not displayed]

FINDINGS: 3D Mammographic images were obtained following ultrasound-guided
core biopsy of a mass in the right breast at the 10 o'clock
position. A ribbon shaped biopsy marking clip is present at the site
of the biopsied mass in the right breast at the 10 o'clock position.
IMPRESSION: Ribbon shaped biopsy marking clip at site of biopsied mass in the
right breast at the 10 o'clock position.

Final Assessment: Post Procedure Mammograms for Marker Placement

## 2024-05-27 ENCOUNTER — Ambulatory Visit: Payer: Self-pay | Admitting: Physician Assistant

## 2024-05-27 ENCOUNTER — Encounter: Payer: Self-pay | Admitting: Physician Assistant

## 2024-05-27 VITALS — BP 116/75 | HR 88 | Temp 97.8°F | Ht 59.0 in | Wt 200.0 lb

## 2024-05-27 DIAGNOSIS — Z6841 Body Mass Index (BMI) 40.0 and over, adult: Secondary | ICD-10-CM

## 2024-05-27 DIAGNOSIS — R109 Unspecified abdominal pain: Secondary | ICD-10-CM

## 2024-05-27 DIAGNOSIS — R112 Nausea with vomiting, unspecified: Secondary | ICD-10-CM

## 2024-05-27 DIAGNOSIS — Z789 Other specified health status: Secondary | ICD-10-CM

## 2024-05-27 NOTE — Progress Notes (Signed)
   BP 116/75   Pulse 88   Temp 97.8 F (36.6 C)   Ht 4' 11 (1.499 m)   Wt 200 lb (90.7 kg)   LMP  (LMP Unknown)   SpO2 98%   BMI 40.40 kg/m    Subjective:    Patient ID: Alison Mendoza, female    DOB: 19-Nov-1980, 43 y.o.   MRN: [id number]  HPI: Alison Mendoza is a 43 y.o. female presenting on 05/27/2024 for Follow-up   HPI  Pt is 43yoF with long history GI issues in today for follow up.  She is still with abd pain that comes and goes.   She has nausea a lot and emesis sometimes.  Nausea is every day and she has it after eating.  She is using the zofran  3-4/week She says she is moving her bowels.   Pt was re-referred to GI at her OV in June but they didn't connect  Pt asks about her weight gain.   Relevant past medical, surgical, family and social history reviewed and updated as indicated. Interim medical history since our last visit reviewed. Allergies and medications reviewed and updated  CURRENT MEDS: Zofran  prn  Review of Systems  Per HPI unless specifically indicated above     Objective:    BP 116/75   Pulse 88   Temp 97.8 F (36.6 C)   Ht 4' 11 (1.499 m)   Wt 200 lb (90.7 kg)   LMP  (LMP Unknown)   SpO2 98%   BMI 40.40 kg/m   Wt Readings from Last 3 Encounters:  05/27/24 200 lb (90.7 kg)  11/21/23 193 lb (87.5 kg)  10/25/23 183 lb (83 kg)    Physical Exam Vitals reviewed.  Constitutional:      General: She is not in acute distress.    Appearance: She is well-developed. She is obese. She is not toxic-appearing.  HENT:     Head: Normocephalic and atraumatic.  Cardiovascular:     Rate and Rhythm: Normal rate and regular rhythm.  Pulmonary:     Effort: Pulmonary effort is normal.     Breath sounds: Normal breath sounds.  Abdominal:     General: Bowel sounds are normal.     Palpations: Abdomen is soft. There is no mass.     Tenderness: There is no abdominal tenderness. There is no guarding or rebound.  Musculoskeletal:     Cervical  back: Neck supple.  Lymphadenopathy:     Cervical: No cervical adenopathy.  Skin:    General: Skin is warm and dry.  Neurological:     Mental Status: She is alert and oriented to person, place, and time.  Psychiatric:        Behavior: Behavior normal.           Assessment & Plan:   Encounter Diagnoses  Name Primary?   Nausea and vomiting, unspecified vomiting type Yes   Abdominal pain, unspecified abdominal location    BMI 40.0-44.9, adult (HCC)    Not proficient in English language      Abd pain/n/v -appt was scheduled for her with GI -pt declined refill of zofran   BMI >40 -labs done march unrevealing to weight -pt was counseled on weight- healthy diet and regular exercise  Pt to follow up two months. She is to contact office sooner prn

## 2024-05-27 NOTE — Patient Instructions (Signed)
 Obesidad en los adultos Obesity, Adult La obesidad es una afeccin que implica tener demasiada grasa corporal total. Tener sobrepeso u obesidad significa que el peso es mayor que lo que se considera saludable para el Biochemist, clinical. La obesidad se determina mediante una medida denominada IMC (ndice de masa muscular). El IMC (ndice de masa corporal) es la estimacin de la grasa corporal y se calcula a partir de la altura y [place]. Si un adulto tiene un [facility] de 30 o superior se considera obeso. La obesidad puede conducir a algunos de los siguientes problemas de salud y [place] graves: Accidente cerebrovascular. Arteriopata coronaria (EAC). Diabetes tipo 2. Algunos tipos de cncer, incluido el cncer de colon, mama, tero y vescula. Presin arterial alta (hipertensin arterial). Colesterol alto. Clculos en la vescula biliar. La obesidad tambin puede contribuir a lo siguiente: Artrosis. Apnea del sueo. Problemas de esterilidad. Cules son las causas? Las causas ms frecuentes de esta afeccin [employer] siguientes: Consumir diariamente alimentos con altos niveles de caloras, azcar y antarctica (the [place] south of 60 deg s). Beber grandes cantidades de bebidas endulzadas con azcar, como refrescos. Nacer con genes que pueden hacerlo ms propenso a ser obeso. Tener una afeccin que causa obesidad, por ejemplo: Hipotiroidismo. Sndrome del ovario poliqustico (SOP). Trastorno alimentario compulsivo. Sndrome de [place]. Tomar ciertos medicamentos, como esteroides, antidepresivos y anticonvulsivos. No ser fsicamente activo (estilo de vida sedentario). No dormir lo suficiente. Qu incrementa el riesgo? Los siguientes factores pueden hacer que sea ms propenso a contraer esta afeccin: Tener antecedentes familiares de obesidad. Vivir en un rea con acceso limitado a las siguientes posibilidades: Parques, centros recreativos o veredas. Alimentos saludables, como se venden en tiendas de comestibles y mercados  de Event organiser. Cules son los signos o sntomas? El principal signo de esta afeccin es tener demasiada grasa corporal. Cmo se diagnostica? Esta afeccin se diagnostica en funcin de lo siguiente: Su IMC. Si usted es un adulto y su IMC es de 30 o ms, se considera que es obeso. La circunferencia de la cintura. Es ignacia medicin alrededor de Lobbyist. El grosor del pliegue cutneo. El mdico puede pellizcar suavemente un pliegue de la piel y [place]. Es posible que le hagan otros estudios para ver si hay afecciones subyacentes. Cmo se trata? El tratamiento de esta afeccin frecuentemente incluye cambiar el estilo de vida. El tratamiento puede incluir algunos o todos los siguientes elementos: Cambios en la dieta. Esto puede incluir el desarrollo de un plan de alimentacin saludable. Realizar actividad fsica con regularidad. Puede incluir una actividad que hace que el corazn lata ms rpido (ejercicio [place]) y Fish farm manager de Pensions consultant. Trabajar con el mdico para disear un programa de ejercicios que sea adecuado para usted. Medicamentos para ayudarle a Publishing copy de peso si no puede perder [employer] por semana despus de seis semanas de comer de manera saludable y de hacer ms actividad fsica. Tratar las afecciones que causan la obesidad (afecciones preexistentes). Ciruga. Las opciones quirrgicas pueden incluir bandas gstricas y bypass gstrico. Se puede realizar una ciruga si: Otros tratamientos no mejoraron su afeccin. Tiene un IMC de 40 o superior. Tiene problemas de salud potencialmente mortales relacionados con la obesidad. Siga estas indicaciones en su casa: Comida y bebida  Siga las instrucciones del mdico respecto de lo que puede comer o beber. El mdico puede indicarle que haga lo siguiente: Limitar las comidas rpidas, los dulces y las colaciones procesadas. Elegir opciones con bajo contenido de [place], como leche descremada en lugar de [place] entera. Consumir cinco o ms  porciones de  frutas o verduras por da. Elegir alimentos saludables cuando coma afuera. Tener a mano colaciones con bajo contenido de [place]. Limitar las bebidas azucaradas, como refrescos, jugo de frutas, t helado endulzado y leche saborizada. Beba suficiente agua para mantener la orina de color amarillo plido. No siga una dieta de [place]. Las dietas de moda pueden ser poco saludables e incluso peligrosas. Otras opciones saludables incluyen: Comer en casa con ms frecuencia. Esto le da ms control sobre lo que come. Aprender a leer las etiquetas de los alimentos. Esto le ayudar a entender cunta comida se considera una porcin. Aprender cul es el tamao de una porcin saludable. Actividad fsica Realice ejercicio con regularidad como se lo haya indicado el mdico. La mayora de los adultos deben hacer hasta 150 minutos de ejercicio de intensidad moderada cada semana. Consulte al mdico qu tipo de ejercicios es seguro para usted y con qu frecuencia debe ejercitarse. Precaliente y elongue adecuadamente antes de hacer actividad fsica. Reljese y elongue despus de hacer actividad fsica. Descanse entre los perodos de [place]. Estilo de vida Trabaje con el mdico y un nutricionista para Ship broker una meta de prdida de peso que sea saludable y razonable para usted. Limite el tiempo que pasa frente a una pantalla. Busque formas de recompensarse que no incluyan alimentos. No beba alcohol si: Su mdico le indica no hacerlo. Est embarazada, puede estar embarazada o est tratando de quedar embarazada. Si bebe alcohol: Limite la cantidad que bebe a lo siguiente: De 0 a 1 medida por da para las mujeres. De 0 a 2 medidas por da para los hombres. Sepa cunta cantidad de alcohol hay en las bebidas que toma. En los [address], una medida equivale a una botella de cerveza de 12 oz (355 ml), un vaso de vino de 5 oz (148 ml) o un vaso de una bebida alcohlica de alta graduacin de 1 oz  (44 ml). Indicaciones generales Registre la prdida de peso en un diario para Education officer, environmental un seguimiento de los alimentos que consume y cunto se Pension scheme manager. Use los medicamentos de venta libre y los recetados solamente como se lo haya indicado el mdico. Tome vitaminas y suplementos solamente como se lo haya indicado el mdico. Considere la posibilidad de Advertising account planner en un grupo de apoyo. El mdico podra recomendarle un grupo de apoyo. Preste atencin a Radiographer, therapeutic mental, ya que la obesidad puede provocar depresin o problemas de [place]. Concurra a todas las visitas de seguimiento. Esto es importante. Comunquese con un mdico si: No puede alcanzar su objetivo de prdida de peso despus de seis semanas de cambios en la dieta y en el estilo de vida. Tiene dificultad para respirar. Resumen La obesidad es una afeccin que implica tener demasiada grasa corporal total. Tener sobrepeso u obesidad significa que el peso es mayor que lo que se considera saludable para el tamao corporal. Lanie con el mdico y un nutricionista para establecer una meta de prdida de peso que sea saludable y razonable para usted. Realice ejercicio con regularidad como se lo haya indicado el mdico. Consulte al mdico qu tipo de ejercicios es seguro para usted y con qu frecuencia debe ejercitarse. Esta informacin no tiene Theme park manager el consejo del mdico. Asegrese de hacerle al mdico cualquier pregunta que tenga. Document Revised: 05/03/2021 Document Reviewed: 05/03/2021 Elsevier Patient Education  2024 [employer].

## 2024-06-17 ENCOUNTER — Telehealth: Payer: Self-pay

## 2024-06-17 NOTE — Telephone Encounter (Signed)
 Attempted call with interpreter services to follow up and determine if there is  need for resources. No answer, left voicemail.  Next appt with [facility] is 07/29/24  Alison Mendoza Skeen RN [facility]

## 2024-07-09 ENCOUNTER — Ambulatory Visit: Payer: Self-pay | Admitting: Physician Assistant

## 2024-07-29 ENCOUNTER — Ambulatory Visit: Payer: Self-pay | Admitting: Physician Assistant

## 2024-07-29 ENCOUNTER — Encounter: Payer: Self-pay | Admitting: Physician Assistant

## 2024-07-29 VITALS — BP 119/74 | HR 72 | Temp 97.1°F | Ht 59.0 in | Wt 202.2 lb

## 2024-07-29 DIAGNOSIS — Z789 Other specified health status: Secondary | ICD-10-CM

## 2024-07-29 DIAGNOSIS — H11003 Unspecified pterygium of eye, bilateral: Secondary | ICD-10-CM

## 2024-07-29 DIAGNOSIS — Z6841 Body Mass Index (BMI) 40.0 and over, adult: Secondary | ICD-10-CM

## 2024-07-29 DIAGNOSIS — R112 Nausea with vomiting, unspecified: Secondary | ICD-10-CM

## 2024-07-29 DIAGNOSIS — R109 Unspecified abdominal pain: Secondary | ICD-10-CM

## 2024-07-29 MED ORDER — ONDANSETRON HCL 4 MG PO TABS: 4.0000 mg | ORAL_TABLET | Freq: Three times a day (TID) | ORAL | 0 refills | Status: AC | PRN

## 2024-07-29 NOTE — Patient Instructions (Signed)
 Alimentacin saludable en los [employer], Adult Una alimentacin saludable puede ayudarlo a Barista y Pharmacologist un peso saludable, reducir el riesgo de tener enfermedades crnicas y vivir Neomia Dear vida larga y productiva. Es importante que siga una modalidad de alimentacin saludable. Sus necesidades nutricionales y calricas deben satisfacerse principalmente con distintos alimentos ricos en nutrientes. Consejos para seguir Surveyor, minerals Lea las etiquetas de los alimentos Lea las etiquetas y elija las que digan lo siguiente: Productos reducidos en sodio o con bajo contenido de [place]. Jugos con 100 % jugo de fruta. Alimentos con bajo contenido de grasas saturadas (menos de 3 g por porcin) y alto contenido de grasas poliinsaturadas y Mining engineer. Alimentos con cereales integrales, como trigo integral, trigo partido, arroz integral y arroz salvaje. Cereales integrales fortificados con cido flico. Esto se recomienda a las mujeres embarazadas o que desean quedar embarazadas. Lea las etiquetas y no coma ni beba lo siguiente: Alimentos o bebidas con azcar agregada. Estos incluyen los alimentos que contienen azcar moreno, endulzante a base de maz, jarabe de maz, dextrosa, fructosa, glucosa, jarabe de maz de alta fructosa, miel, azcar invertido, lactosa, jarabe de [place], maltosa, Sunflower, azcar sin refinar, sacarosa, trehalosa y azcar turbinado. Limite el consumo de azcar agregada a menos del 10 % del total de caloras diarias. No consuma ms que las siguientes cantidades de azcar agregada por da: 6 cucharaditas (25 g) para las mujeres. 9 cucharaditas (38 g) para los hombres. Los alimentos que contienen almidones y cereales refinados o procesados. Los productos de cereales refinados, como harina blanca, harina de maz desgerminada, pan blanco y arroz blanco. Al ir de compras Elija refrigerios ricos en nutrientes, como verduras, frutas enteras y frutos secos. Evite los refrigerios con  alto contenido de caloras y International aid/development worker, como las papas fritas, los refrigerios frutales y los caramelos. Use alios y productos para untar a base de aceite con los Publishing rights manager de grasas slidas como la [place], la [place], la crema agria o el queso crema. Limite las salsas, las mezclas y los productos "instantneos" preelaborados como el arroz saborizado, los fideos instantneos y las pastas listas para comer. Pruebe ms fuentes de protena vegetal, como tofu, tempeh, frijoles negros, edamame, lentejas, frutos secos y semillas. Explore planes de alimentacin como la dieta mediterrnea o la dieta vegetariana. Pruebe salsas cardiosaludables hechas con frijoles y grasas saludables, como hummus y [place]. Las verduras van muy bien con ellas. Al cocinar Use aceite para Designer, multimedia de grasas slidas como [place], margarina o [place] de [place]. En lugar de frer, trate de cocinar en el horno, en la plancha o en la parrilla, o hervir los alimentos. Retire la parte grasa de las carnes antes de cocinarlas. Cocine las verduras al vapor en agua o caldo. Planificacin de las comidas  En las comidas, imagine dividir su plato en cuartos: La mitad del plato tiene frutas y verduras. Un cuarto del plato tiene cereales integrales. Un cuarto del plato tiene protena, especialmente carnes [place], aves, huevos, tofu, frijoles o frutos secos. Incluya lcteos descremados en su dieta diaria. Estilo de vida Elija opciones saludables en todos los mbitos, como en el hogar, el [place], la [place], los restaurantes y [place]. Prepare los alimentos de un modo seguro: Lvese las manos despus de manipular carnes crudas. Donde prepare alimentos, mantenga las superficies limpias lavndolas regularmente con agua caliente y [place]. Mantenga las carnes crudas separadas de los alimentos que estn listos para comer como las frutas y las verduras. Cocine los frutos  de mar, carnes, aves y Loss adjuster, chartered la temperatura recomendada. Consiga un termmetro para alimentos. Almacene los alimentos a temperaturas seguras. En general: Mantenga los alimentos fros a una temperatura de 40 F (4,4 C) o inferior. Mantenga los alimentos calientes a una temperatura de 140 F (60 C) o superior. Mantenga el congelador a una temperatura de 0 F (-17,8 C) o inferior. Los alimentos no son seguros para su consumo cuando han estado a una temperatura de entre 40 y 140 F (4.4 y 60 C) por ms de 2 horas. Qu alimentos debo comer? Frutas Propngase comer entre 1 y 2 tazas de frutas frescas, Primary school teacher (en su jugo natural) o Primary school teacher. Una taza de fruta equivale a 1 manzana pequea, 1 banana grande, 8 fresas grandes, 1 taza (237 g) de fruta enlatada,  taza (82 g) de fruta seca o 1 taza (240 ml) de jugo al 100 %. Verduras Propngase comer de 2 a 4 tazas de verduras frescas y Primary school teacher, incluyendo diferentes variedades y colores. Una taza de verduras equivale a 1 taza (91 g) de brcoli o coliflor, 2 zanahorias medianas, 2 tazas (150 g) de verduras de [employer] crudas, 1 tomate grande, 1 pimiento morrn grande, 1 batata grande o 1 patata blanca mediana. Cereales Propngase comer el equivalente a entre 4 y 10 onzas de cereales integrales por Futures trader. Algunos ejemplos de equivalentes a 1 onza de cereales son 1 rebanada de pan, 1 taza (40 g) de cereal listo para comer, 3 tazas (24 g) de palomitas de maz o  taza (93 g) de arroz cocido. Carnes y otras protenas Propngase comer el equivalente a entre 5 y 7  onzas de protena por Futures trader. Algunos ejemplos de equivalentes a 1 onza de protenas incluyen 1 huevo,  oz de frutos secos (12 almendras, 24 pistachos o 7 mitades de nueces), 1/4 taza (90 g) de frijoles cocidos, 6 cucharadas (90 g) de hummus o 1 cucharada (16 g) de [place] de man. Un corte de carne o pescado del tamao de un mazo de cartas equivale aproximadamente a 3 a 4 onzas (85  g). De las protenas que consume cada semana, intente que al menos 8 onzas (227 g) sean frutos de mar. Esto equivale a unas 2 porciones por semana. Esto incluye salmn, trucha, arenque y anchoas. Lcteos [employer] a 3 tazas de lcteos descremados o con bajo contenido de Museum/gallery curator. Algunos ejemplos de equivalentes a 1 taza de lcteos son 1 taza (240 ml) de leche, 8 onzas (250 g) de yogur, 1 onzas (44 g) de queso natural o 1 taza (240 ml) de leche de soja fortificada. Grasas y aceites Propngase consumir alrededor de 5 cucharaditas (21 g) de grasas y Acupuncturist. Elija grasas monoinsaturadas, como el aceite de canola y de [place], la [place] con aceite de [place] o de [place], [place], [place] de man y Games developer de los frutos secos, o bien grasas poliinsaturadas, como el aceite de [place], maz y soja, nueces, piones, semillas de ssamo, semillas de girasol y semillas de lino. Bebidas Propngase beber 6 vasos de 8 onzas de Warehouse manager. Limite el caf a entre 3 y 5 tazas de ocho onzas por Futures trader. Limite el consumo de bebidas con cafena que tengan caloras agregadas, como los refrescos y las bebidas energizantes. Si bebe alcohol: Limite la cantidad que bebe a lo siguiente: De 0 a 1 medida al da si es [place]. De 0 a 2 medidas  al da si es varn. Sepa cunta cantidad de alcohol hay en las bebidas que toma. En los [address], una medida es una botella de cerveza de 12 oz (355 ml), un vaso de vino de 5 oz (148 ml) o un vaso de una bebida alcohlica de alta graduacin de 1 oz (44 ml). Condimentos y otros alimentos Trate de no agregar demasiada sal a los alimentos. Trate de usar hierbas y especias en lugar de sal. Trate de no agregar azcar a los alimentos. Esta informacin se basa en las pautas de nutricin de los [employer]. Para obtener ms informacin, visite [email or web address]. Las Information systems manager. Es posible que necesite cantidades  diferentes. Esta informacin no tiene Theme park manager el consejo del mdico. Asegrese de hacerle al mdico cualquier pregunta que tenga. Document Revised: 07/04/2022 Document Reviewed: 07/04/2022 Elsevier Patient Education  2024 [employer].

## 2024-07-29 NOTE — Progress Notes (Signed)
 BP 119/74   Pulse 72   Temp (!) 97.1 F (36.2 C)   Ht 4' 11 (1.499 m)   Wt 202 lb 4 oz (91.7 kg)   LMP  (LMP Unknown)   SpO2 98%   BMI 40.85 kg/m    Subjective:    Patient ID: Alison Mendoza, female    DOB: 07-31-1981, 43 y.o.   MRN: [id number]  HPI: Alison Mendoza is a 43 y.o. female presenting on 07/29/2024 for Follow-up (Pt needs refills on zofran ), Weight Gain (Pt is concerned about her weight gain. Pt states she has not increased her food intake. Pt states she sometimes gets anxious to eat, but doesn't. Pt states she is physically active at work where she has to walk a lot. Pt is concerned weight gain may be due to her having her hysterectomy 2024), and Eye Problem (Pt was referred in 2023 to ophthalmology for pterygium, but did not schedule due to not having financial assistance. Pt states she is willing to apply for financial assistance and to get scheduled.)   HPI   Chief Complaint  Patient presents with   Follow-up    Pt needs refills on zofran    Weight Gain    Pt is concerned about her weight gain. Pt states she has not increased her food intake. Pt states she sometimes gets anxious to eat, but doesn't. Pt states she is physically active at work where she has to walk a lot. Pt is concerned weight gain may be due to her having her hysterectomy 2024   Eye Problem    Pt was referred in 2023 to ophthalmology for pterygium, but did not schedule due to not having financial assistance. Pt states she is willing to apply for financial assistance and to get scheduled.      She says she has letter where she got approved for cafa-    Relevant past medical, surgical, family and social history reviewed and updated as indicated. Interim medical history since our last visit reviewed. Allergies and medications reviewed and updated.   Current Outpatient Medications:    ondansetron  (ZOFRAN ) 4 MG tablet, Take 1 tablet (4 mg total) by mouth every 8 (eight) hours as needed  for nausea or vomiting. Tome una tableta por boca cada 8 horas cuando sea necesario para la nausea o vomito (Patient not taking: Reported on 07/29/2024), Disp: 20 tablet, Rfl: 0   Wheat Dextrin (BENEFIBER) POWD, Take by mouth daily. (Patient not taking: Reported on 07/29/2024), Disp: , Rfl:     Review of Systems  Per HPI unless specifically indicated above     Objective:    BP 119/74   Pulse 72   Temp (!) 97.1 F (36.2 C)   Ht 4' 11 (1.499 m)   Wt 202 lb 4 oz (91.7 kg)   LMP  (LMP Unknown)   SpO2 98%   BMI 40.85 kg/m   Wt Readings from Last 3 Encounters:  07/29/24 202 lb 4 oz (91.7 kg)  05/27/24 200 lb (90.7 kg)  11/21/23 193 lb (87.5 kg)    Physical Exam Vitals reviewed.  Constitutional:      General: She is not in acute distress.    Appearance: She is well-developed. She is not toxic-appearing.  HENT:     Head: Normocephalic and atraumatic.  Eyes:     Comments: Pterigium B  Cardiovascular:     Rate and Rhythm: Normal rate and regular rhythm.  Pulmonary:     Effort: Pulmonary effort is normal.  Breath sounds: Normal breath sounds.  Abdominal:     General: Bowel sounds are normal.     Palpations: Abdomen is soft. There is no mass.     Tenderness: There is no abdominal tenderness.  Musculoskeletal:     Cervical back: Neck supple.     Right lower leg: No edema.     Left lower leg: No edema.  Lymphadenopathy:     Cervical: No cervical adenopathy.  Skin:    General: Skin is warm and dry.  Neurological:     Mental Status: She is alert and oriented to person, place, and time.  Psychiatric:        Behavior: Behavior normal.           Assessment & Plan:    Encounter Diagnoses  Name Primary?   Nausea and vomiting, unspecified vomiting type Yes   Abdominal pain, unspecified abdominal location    Pterygium eye, bilateral    BMI 40.0-44.9, adult (HCC)    Not proficient in English language      -will refer back to [facility] for pterigium -pt encouraged  to complete [facility] -pt to see GI next week as scheduled -pt counseled on increasing exercise and healthy eating -follow up six months.  She is to contact office sooner prn

## 2024-08-05 NOTE — Progress Notes (Addendum)
 Chief Complaint: Nausea and vomiting  HPI:    Alison Mendoza is a 43 year old Hispanic female, known to Dr. Avram, with a past medical history as listed below including H. pylori gastritis, who was referred to me by Alison Kirsch, PA-C for a complaint of nausea and vomiting.      09/30/2021 right upper quadrant ultrasound with fatty liver and otherwise normal.    11/30/2022 colonoscopy with 2 to-6 mm polyps in the cecum, 2020 exam with a 2 mm ileal ulcer and lymphocytic colitis.  Apology showed adenoma.  Repeat recommended in 7 years.    11/30/2022 EGD with normal esophagus and erythematous mucosa in the stomach.  Biopsy showed H. pylori treated with quadruple therapy.  Recommended follow-up H. pylori stool antigen.    02/08/2023 H. pylori stool antigen negative.    09/19/2023 CTAP with contrast with diverticulosis.    11/21/2023 TSH normal.    07/29/2024 patient saw PCP in regards to nausea and vomiting.  At that time refill of Zofran  given and concerned about weight gain.    Today, presents to clinic today with interpreter, patient tells me that she has continued with nausea and vomiting and right upper quadrant pain really over the past year.  No real change after treatment of H. pylori.  Also experiencing heartburn and reflux symptoms and not currently on medication for this.  Tells me that this is all throughout the day and typically worse when she eats.  If she eats the wrong thing she will just vomit.  She has not lost weight though interestingly in fact has gained weight.  Also right upper quadrant pain that is rated as a 7-8/10 and radiates over to her epigastrium.  Again worse if she eats.    Denies fever, chills or weight loss.  Past Medical History:  Diagnosis Date   Abnormal Pap smear    Depression    Headache(784.0)    Helicobacter pylori gastritis 12/12/2022   History of worms    3 yrs ago   Hx of adenomatous polyp of colon 12/12/2022   March 2024 diminutive adenoma plus  inflammatory polyp recall 7 years 2031   Lymphocytic colitis 05/23/2019   PONV (postoperative nausea and vomiting)    Salmonella 2006    Past Surgical History:  Procedure Laterality Date   BREAST BIOPSY Right 03/07/2022   Focal pseudoangiomatous stromal hyperplasia (PASH)   CESAREAN SECTION  09/18/2001   done in [place]; vertical ext scar; unknown int. scar   COLONOSCOPY     COLPOSCOPY     ROBOTIC ASSISTED LAPAROSCOPIC HYSTERECTOMY AND SALPINGECTOMY Bilateral 07/10/2023   Procedure: XI ROBOTIC ASSISTED LAPAROSCOPIC HYSTERECTOMY AND BILATERAL SALPINGECTOMY;  Surgeon: Alison Nest, DO;  Location: [facility];  Service: Gynecology;  Laterality: Bilateral;   UPPER GASTROINTESTINAL ENDOSCOPY      Current Outpatient Medications  Medication Sig Dispense Refill   ondansetron  (ZOFRAN ) 4 MG tablet Take 1 tablet (4 mg total) by mouth every 8 (eight) hours as needed for nausea or vomiting. Tome una tableta por boca cada 8 horas cuando sea necesario para la nausea o vomito 20 tablet 0   Wheat Dextrin (BENEFIBER) POWD Take by mouth daily. (Patient not taking: Reported on 07/29/2024)     No current facility-administered medications for this visit.    Allergies as of 08/06/2024   (No Known Allergies)    Family History  Problem Relation Age of Onset   Lung disease Mother    Diabetes Sister    Diabetes Paternal Aunt  Colon cancer Neg Hx    Esophageal cancer Neg Hx    Rectal cancer Neg Hx    Stomach cancer Neg Hx    Breast cancer Neg Hx     Social History   Socioeconomic History   Marital status: Significant Other    Spouse name: Not on file   Number of children: 2   Years of education: Not on file   Highest education level: 4th grade  Occupational History   Not on file  Tobacco Use   Smoking status: Never   Smokeless tobacco: Never  Vaping Use   Vaping status: Never Used  Substance and Sexual Activity   Alcohol use: Not Currently    Comment: rarely   Drug use: No   Sexual  activity: Yes    Birth control/protection: Condom  Other Topics Concern   Not on file  Social History Narrative   Boyfriend/Husband live w/ daughters   Plumber's helper   fromi [place]      Daughter 2015   Daughter 2003      Never smoker, no drugs, rare EtOH         Social Drivers of Corporate Investment Banker Strain: Not on file  Food Insecurity: No Food Insecurity (11/10/2022)   Hunger Vital Sign    Worried About Running Out of Food in the Last Year: Never true    Ran Out of Food in the Last Year: Never true  Transportation Needs: No Transportation Needs (11/10/2022)   PRAPARE - Transportation    Lack of Transportation (Medical): No    Lack of Transportation (Non-Medical): No  Physical Activity: Not on file  Stress: Not on file  Social Connections: Not on file  Intimate Partner Violence: Not on file    Review of Systems:    Constitutional: No weight loss, fever or chills Cardiovascular: No chest pain Respiratory: No SOB Gastrointestinal: See HPI and otherwise negative   Physical Exam:  Vital signs: BP 114/80   Pulse 74   Ht 4' 11 (1.499 m)   Wt 197 lb 9.6 oz (89.6 kg)   LMP  (LMP Unknown)   BMI 39.91 kg/m    Constitutional:   Pleasant overweight Hispanic female appears to be in NAD, Well developed, Well nourished, alert and cooperative Head:  Normocephalic and atraumatic. Eyes:   PEERL, EOMI. No icterus. Conjunctiva pink. Ears:  Normal auditory acuity. Neck:  Supple Throat: Oral cavity and pharynx without inflammation, swelling or lesion.  Respiratory: Respirations even and unlabored. Lungs clear to auscultation bilaterally.   No wheezes, crackles, or rhonchi.  Cardiovascular: Normal S1, S2. No MRG. Regular rate and rhythm. No peripheral edema, cyanosis or pallor.  Gastrointestinal:  Soft, nondistended, mild to moderate right upper quadrant and epigastric TTP with some involuntary guarding. Normal bowel sounds. No appreciable masses or hepatomegaly. Rectal:   Not performed.  Msk:  Symmetrical without gross deformities. Without edema, no deformity or joint abnormality.  Neurologic:  Alert and  oriented x4;  grossly normal neurologically.  Skin:   Dry and intact without significant lesions or rashes. Psychiatric: Demonstrates good judgement and reason without abnormal affect or behaviors.  RELEVANT LABS AND IMAGING: CBC    Component Value Date/Time   WBC 8.7 09/19/2023 2316   RBC 4.84 09/19/2023 2316   HGB 12.5 09/19/2023 2316   HGB 12.3 09/07/2023 1124   HCT 39.4 09/19/2023 2316   HCT 39.9 09/07/2023 1124   PLT 248 09/19/2023 2316   PLT 280 09/07/2023 1124  MCV 81.4 09/19/2023 2316   MCV 82 09/07/2023 1124   MCH 25.8 (L) 09/19/2023 2316   MCHC 31.7 09/19/2023 2316   RDW 16.8 (H) 09/19/2023 2316   RDW 16.1 (H) 09/07/2023 1124   LYMPHSABS 2.5 07/27/2022 2237   MONOABS 0.8 07/27/2022 2237   EOSABS 0.2 07/27/2022 2237   BASOSABS 0.0 07/27/2022 2237    CMP     Component Value Date/Time   NA 139 10/25/2023 0800   K 3.9 10/25/2023 0800   CL 109 10/25/2023 0800   CO2 21 (L) 10/25/2023 0800   GLUCOSE 108 (H) 10/25/2023 0800   BUN 12 10/25/2023 0800   CREATININE 0.68 10/25/2023 0800   CREATININE 0.58 12/29/2015 0831   CALCIUM  8.7 (L) 10/25/2023 0800   PROT 7.4 10/25/2023 0800   ALBUMIN 3.7 10/25/2023 0800   AST 17 10/25/2023 0800   ALT 19 10/25/2023 0800   ALKPHOS 111 10/25/2023 0800   BILITOT 0.7 10/25/2023 0800   GFRNONAA >60 10/25/2023 0800   GFRNONAA >89 12/29/2015 0831   GFRAA >60 04/19/2019 2325   GFRAA >89 12/29/2015 0831    Assessment: 1.  Nausea and vomiting: Constant for the patient over the last 6 months at least, possibly little longer, last ultrasound in 2023 with no abnormality in the gallbladder, now with continued right upper quadrant pain and nausea and vomiting worse with eating, history of EGD in 2024 with H. pylori gastritis follow-up fecal antigen negative; consider gallbladder etiology versus gastritis  +/- GERD vs pancreatitis 2.  History of H. pylori gastritis: Found at time of last EGD in 2024 3.  Right upper quadrant pain  Plan: 1.  Scheduled patient for repeat right upper quadrant ultrasound, may need HIDA scan pending results 2.  Started Pantoprazole  40 mg twice daily, 30-60 minutes for breakfast and dinner #60 with 5 refills 3.  Reviewed antireflux diet and lifestyle modifications 4.  Continue Zofran  as needed 5.  Will retest for H. pylori with stool antigen  6.  Labs today including CBC and CMP as well as lipase 7.  Patient to follow in clinic with me in 2 months or sooner if necessary.  Alison Failing, PA-C [facility] 08/05/2024, 10:23 AM  Cc: Alison Kirsch, PA-C   Primary gastroenterologist:  Workup revealed chronic cholecystitis and markedly reduced gallbladder ejection fraction on HIDA scan after ultrasound was negative.  She has been appropriately referred to surgery-[facility] per her request due to financial considerations.  Alison CHARLENA Commander, MD, Alison Mendoza

## 2024-08-06 ENCOUNTER — Other Ambulatory Visit: Payer: Self-pay

## 2024-08-06 ENCOUNTER — Encounter: Payer: Self-pay | Admitting: Physician Assistant

## 2024-08-06 ENCOUNTER — Ambulatory Visit (INDEPENDENT_AMBULATORY_CARE_PROVIDER_SITE_OTHER): Payer: Self-pay | Admitting: Physician Assistant

## 2024-08-06 ENCOUNTER — Ambulatory Visit: Payer: Self-pay | Admitting: Physician Assistant

## 2024-08-06 VITALS — BP 114/80 | HR 74 | Ht 59.0 in | Wt 197.6 lb

## 2024-08-06 DIAGNOSIS — R112 Nausea with vomiting, unspecified: Secondary | ICD-10-CM

## 2024-08-06 DIAGNOSIS — R1011 Right upper quadrant pain: Secondary | ICD-10-CM

## 2024-08-06 DIAGNOSIS — R110 Nausea: Secondary | ICD-10-CM

## 2024-08-06 DIAGNOSIS — K219 Gastro-esophageal reflux disease without esophagitis: Secondary | ICD-10-CM

## 2024-08-06 DIAGNOSIS — Z8619 Personal history of other infectious and parasitic diseases: Secondary | ICD-10-CM

## 2024-08-06 LAB — CBC WITH DIFFERENTIAL/PLATELET
Basophils Absolute: 0 K/uL (ref 0.0–0.1)
Basophils Relative: 0.5 % (ref 0.0–3.0)
Eosinophils Absolute: 0.1 K/uL (ref 0.0–0.7)
Eosinophils Relative: 1 % (ref 0.0–5.0)
HCT: 40.4 % (ref 36.0–46.0)
Hemoglobin: 13.5 g/dL (ref 12.0–15.0)
Lymphocytes Relative: 20.2 % (ref 12.0–46.0)
Lymphs Abs: 1.8 K/uL (ref 0.7–4.0)
MCHC: 33.5 g/dL (ref 30.0–36.0)
MCV: 85.2 fl (ref 78.0–100.0)
Monocytes Absolute: 0.7 K/uL (ref 0.1–1.0)
Monocytes Relative: 7.4 % (ref 3.0–12.0)
Neutro Abs: 6.3 K/uL (ref 1.4–7.7)
Neutrophils Relative %: 70.9 % (ref 43.0–77.0)
Platelets: 243 K/uL (ref 150.0–400.0)
RBC: 4.74 Mil/uL (ref 3.87–5.11)
RDW: 14.2 % (ref 11.5–15.5)
WBC: 8.9 K/uL (ref 4.0–10.5)

## 2024-08-06 LAB — COMPREHENSIVE METABOLIC PANEL WITH GFR
ALT: 19 U/L (ref 0–35)
AST: 19 U/L (ref 0–37)
Albumin: 4.4 g/dL (ref 3.5–5.2)
Alkaline Phosphatase: 98 U/L (ref 39–117)
BUN: 7 mg/dL (ref 6–23)
CO2: 25 meq/L (ref 19–32)
Calcium: 8.9 mg/dL (ref 8.4–10.5)
Chloride: 106 meq/L (ref 96–112)
Creatinine, Ser: 0.68 mg/dL (ref 0.40–1.20)
GFR: 105.78 mL/min (ref >60.00)
Glucose, Bld: 94 mg/dL (ref 70–99)
Potassium: 4 meq/L (ref 3.5–5.1)
Sodium: 138 meq/L (ref 135–145)
Total Bilirubin: 0.4 mg/dL (ref 0.2–1.2)
Total Protein: 7.9 g/dL (ref 6.0–8.3)

## 2024-08-06 LAB — LIPASE: Lipase: 21 U/L (ref 11.0–59.0)

## 2024-08-06 MED ORDER — PANTOPRAZOLE SODIUM 40 MG PO TBEC: 40.0000 mg | DELAYED_RELEASE_TABLET | Freq: Two times a day (BID) | ORAL | 3 refills | Status: AC

## 2024-08-06 NOTE — Progress Notes (Signed)
 Can you please call and let the patient know that her lab results were normal from today.  Will see what the ultrasound shows and hopefully the Pantoprazole  gives her some relief.  Thanks, JL L

## 2024-08-06 NOTE — Patient Instructions (Addendum)
 Your provider has requested that you go to the basement level for lab work before leaving today. Press B on the elevator. The lab is located at the first door on the left as you exit the elevator.  Due to recent changes in healthcare laws, you may see the results of your imaging and laboratory studies on MyChart before your provider has had a chance to review them.  We understand that in some cases there may be results that are confusing or concerning to you. Not all laboratory results come back in the same time frame and the provider may be waiting for multiple results in order to interpret others.  Please give us  48 hours in order for your provider to thoroughly review all the results before contacting the office for clarification of your results.    We have sent the following medications to your pharmacy for you to pick up at your convenience: Pantoprazole  40 mg twice a day.  You have been scheduled for an abdominal ultrasound at [facility] (1st floor of hospital) on Friday, 08/08/24 at 10:30 am. Please arrive 30 minutes prior to your appointment for registration. Make certain not to have anything to eat or drink 6 hours prior to your appointment. Should you need to reschedule your appointment, please contact radiology at [phone]. This test typically takes about 30 minutes to perform.  Thank you for trusting me with your gastrointestinal care!  Delon Failing, PA-C   _______________________________________________________  If your blood pressure at your visit was 140/90 or greater, please contact your primary care physician to follow up on this.  _______________________________________________________  If you are age 44 or older, your body mass index should be between 23-30. Your Body mass index is 39.91 kg/m. If this is out of the aforementioned range listed, please consider follow up with your Primary Care Provider.  If you are age 12 or younger, your body mass index  should be between 19-25. Your Body mass index is 39.91 kg/m. If this is out of the aformentioned range listed, please consider follow up with your Primary Care Provider.   ________________________________________________________  The [facility] providers would like to encourage you to use MYCHART to communicate with providers for non-urgent requests or questions.  Due to long hold times on the telephone, sending your provider a message by [facility] may be a faster and more efficient way to get a response.  Please allow 48 business hours for a response.  Please remember that this is for non-urgent requests.  _______________________________________________________  [facility] is using a team-based approach to care.  Your team is made up of your doctor and two to three APPS. Our APPS (Nurse Practitioners and Physician Assistants) work with your physician to ensure care continuity for you. They are fully qualified to address your health concerns and develop a treatment plan. They communicate directly with your gastroenterologist to care for you. Seeing the Advanced Practice Practitioners on your physician's team can help you by facilitating care more promptly, often allowing for earlier appointments, access to diagnostic testing, procedures, and other specialty referrals.

## 2024-08-06 NOTE — Telephone Encounter (Signed)
 I spoke to Alison Mendoza and advised her that her labs were normal, so we will wait to see what her ultrasound shows.

## 2024-08-08 ENCOUNTER — Ambulatory Visit (HOSPITAL_COMMUNITY)
Admission: RE | Admit: 2024-08-08 | Discharge: 2024-08-08 | Disposition: A | Discharge status: Home / Self Care | Payer: Self-pay | Source: Ambulatory Visit | Attending: Physician Assistant | Admitting: Physician Assistant

## 2024-08-08 DIAGNOSIS — K219 Gastro-esophageal reflux disease without esophagitis: Secondary | ICD-10-CM | POA: Insufficient documentation

## 2024-08-08 DIAGNOSIS — R112 Nausea with vomiting, unspecified: Secondary | ICD-10-CM | POA: Insufficient documentation

## 2024-08-08 DIAGNOSIS — R1011 Right upper quadrant pain: Secondary | ICD-10-CM | POA: Insufficient documentation

## 2024-08-11 NOTE — Addendum Note (Signed)
 Addended by: CONCHA NORRIS A on: 08/11/2024 12:56 PM   Modules accepted: Orders

## 2024-08-19 ENCOUNTER — Ambulatory Visit (HOSPITAL_COMMUNITY)
Admission: RE | Admit: 2024-08-19 | Discharge: 2024-08-19 | Disposition: A | Discharge status: Home / Self Care | Payer: Self-pay | Source: Ambulatory Visit | Attending: Physician Assistant | Admitting: Physician Assistant

## 2024-08-19 DIAGNOSIS — R110 Nausea: Secondary | ICD-10-CM | POA: Insufficient documentation

## 2024-08-19 DIAGNOSIS — R1011 Right upper quadrant pain: Secondary | ICD-10-CM | POA: Insufficient documentation

## 2024-08-19 MED ORDER — TECHNETIUM TC 99M MEBROFENIN IV KIT
5.0000 | PACK | Freq: Once | INTRAVENOUS | Status: AC | PRN
  Administered 2024-08-19: 5.2 via INTRAVENOUS

## 2024-08-21 ENCOUNTER — Ambulatory Visit: Payer: Self-pay | Admitting: Physician Assistant

## 2024-08-21 NOTE — Progress Notes (Signed)
 Please let patient know that her HIDA scan did show chronic cholecystitis.  I would like to refer her to general surgery for possible cholecystectomy.  Thanks, JL L

## 2024-10-13 ENCOUNTER — Ambulatory Visit: Payer: Self-pay | Admitting: Physician Assistant

## 2025-01-27 ENCOUNTER — Ambulatory Visit: Payer: Self-pay | Admitting: Physician Assistant
